# Patient Record
Sex: Female | Born: 1950 | Race: Black or African American | Hispanic: No | Smoking: Current every day smoker
Health system: Southern US, Community
[De-identification: ages and names within clinical notes are randomized; demographics above are authoritative.]

## PROBLEM LIST (undated history)

## (undated) DIAGNOSIS — K219 Gastro-esophageal reflux disease without esophagitis: Secondary | ICD-10-CM

## (undated) DIAGNOSIS — F32A Depression, unspecified: Secondary | ICD-10-CM

## (undated) DIAGNOSIS — T8859XA Other complications of anesthesia, initial encounter: Secondary | ICD-10-CM

## (undated) DIAGNOSIS — E119 Type 2 diabetes mellitus without complications: Secondary | ICD-10-CM

## (undated) DIAGNOSIS — L409 Psoriasis, unspecified: Secondary | ICD-10-CM

## (undated) DIAGNOSIS — G709 Myoneural disorder, unspecified: Secondary | ICD-10-CM

## (undated) DIAGNOSIS — F329 Major depressive disorder, single episode, unspecified: Secondary | ICD-10-CM

## (undated) DIAGNOSIS — F5104 Psychophysiologic insomnia: Secondary | ICD-10-CM

## (undated) DIAGNOSIS — C189 Malignant neoplasm of colon, unspecified: Secondary | ICD-10-CM

## (undated) DIAGNOSIS — T7840XA Allergy, unspecified, initial encounter: Secondary | ICD-10-CM

## (undated) DIAGNOSIS — M1990 Unspecified osteoarthritis, unspecified site: Secondary | ICD-10-CM

## (undated) DIAGNOSIS — I10 Essential (primary) hypertension: Secondary | ICD-10-CM

## (undated) DIAGNOSIS — Z860100 Personal history of colon polyps, unspecified: Secondary | ICD-10-CM

## (undated) DIAGNOSIS — E785 Hyperlipidemia, unspecified: Secondary | ICD-10-CM

## (undated) HISTORY — DX: Essential (primary) hypertension: I10

## (undated) HISTORY — PX: ABDOMINAL HYSTERECTOMY: SHX81

## (undated) HISTORY — DX: Type 2 diabetes mellitus without complications: E11.9

## (undated) HISTORY — DX: Depression, unspecified: F32.A

## (undated) HISTORY — DX: Allergy, unspecified, initial encounter: T78.40XA

## (undated) HISTORY — DX: Personal history of colonic polyps: Z86.010

## (undated) HISTORY — DX: Psychophysiologic insomnia: F51.04

## (undated) HISTORY — DX: Hyperlipidemia, unspecified: E78.5

## (undated) HISTORY — PX: CHOLECYSTECTOMY: SHX55

## (undated) HISTORY — DX: Psoriasis, unspecified: L40.9

## (undated) HISTORY — DX: Myoneural disorder, unspecified: G70.9

## (undated) HISTORY — PX: OTHER SURGICAL HISTORY: SHX169

## (undated) HISTORY — DX: Personal history of colon polyps, unspecified: Z86.0100

---
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
## 1998-11-25 HISTORY — PX: POLYPECTOMY: SHX149

## 1999-11-26 HISTORY — PX: HAND SURGERY: SHX662

## 2003-11-26 HISTORY — PX: CERVICAL FUSION: SHX112

## 2005-07-05 ENCOUNTER — Ambulatory Visit: Payer: Self-pay | Admitting: Cardiovascular Disease

## 2005-08-13 ENCOUNTER — Ambulatory Visit: Payer: Self-pay | Admitting: Family Medicine

## 2005-08-22 ENCOUNTER — Ambulatory Visit: Payer: Self-pay | Admitting: Family Medicine

## 2005-09-09 ENCOUNTER — Ambulatory Visit: Payer: Self-pay | Admitting: Pain Medicine

## 2005-09-19 ENCOUNTER — Encounter: Payer: Self-pay | Admitting: Pain Medicine

## 2005-09-24 ENCOUNTER — Ambulatory Visit: Payer: Self-pay | Admitting: Pain Medicine

## 2005-09-25 ENCOUNTER — Encounter: Payer: Self-pay | Admitting: Pain Medicine

## 2005-11-27 ENCOUNTER — Ambulatory Visit: Payer: Self-pay | Admitting: Pain Medicine

## 2005-12-03 ENCOUNTER — Ambulatory Visit: Payer: Self-pay | Admitting: Pain Medicine

## 2005-12-17 ENCOUNTER — Ambulatory Visit: Payer: Self-pay | Admitting: Physician Assistant

## 2006-01-21 ENCOUNTER — Ambulatory Visit: Payer: Self-pay | Admitting: Physician Assistant

## 2006-01-30 ENCOUNTER — Ambulatory Visit: Payer: Self-pay | Admitting: Pain Medicine

## 2006-02-13 ENCOUNTER — Ambulatory Visit: Payer: Self-pay | Admitting: Pain Medicine

## 2006-02-25 ENCOUNTER — Ambulatory Visit: Payer: Self-pay | Admitting: Physician Assistant

## 2009-11-25 HISTORY — PX: CORONARY ANGIOPLASTY: SHX604

## 2010-03-16 DIAGNOSIS — I2510 Atherosclerotic heart disease of native coronary artery without angina pectoris: Secondary | ICD-10-CM | POA: Insufficient documentation

## 2011-02-07 ENCOUNTER — Observation Stay: Payer: Self-pay | Admitting: Internal Medicine

## 2011-02-08 DIAGNOSIS — R079 Chest pain, unspecified: Secondary | ICD-10-CM

## 2011-11-05 DIAGNOSIS — M960 Pseudarthrosis after fusion or arthrodesis: Secondary | ICD-10-CM | POA: Insufficient documentation

## 2011-11-05 DIAGNOSIS — S129XXA Fracture of neck, unspecified, initial encounter: Secondary | ICD-10-CM | POA: Insufficient documentation

## 2012-01-16 DIAGNOSIS — R51 Headache: Secondary | ICD-10-CM

## 2012-01-16 DIAGNOSIS — M47816 Spondylosis without myelopathy or radiculopathy, lumbar region: Secondary | ICD-10-CM | POA: Insufficient documentation

## 2012-01-16 DIAGNOSIS — G950 Syringomyelia and syringobulbia: Secondary | ICD-10-CM | POA: Insufficient documentation

## 2013-04-12 ENCOUNTER — Ambulatory Visit: Payer: Self-pay | Admitting: Orthopedic Surgery

## 2013-04-15 ENCOUNTER — Ambulatory Visit: Payer: Self-pay | Admitting: Gastroenterology

## 2013-05-04 ENCOUNTER — Ambulatory Visit: Payer: Self-pay | Admitting: Orthopedic Surgery

## 2013-07-06 ENCOUNTER — Emergency Department: Payer: Self-pay | Admitting: Emergency Medicine

## 2013-07-06 LAB — CBC
HCT: 42.1 % (ref 35.0–47.0)
MCHC: 34.9 g/dL (ref 32.0–36.0)
Platelet: 208 10*3/uL (ref 150–440)

## 2013-07-06 LAB — BASIC METABOLIC PANEL
Anion Gap: 6 — ABNORMAL LOW (ref 7–16)
Calcium, Total: 9.1 mg/dL (ref 8.5–10.1)
Chloride: 103 mmol/L (ref 98–107)
Co2: 28 mmol/L (ref 21–32)
EGFR (African American): >60
EGFR (Non-African Amer.): >60
Glucose: 128 mg/dL — ABNORMAL HIGH (ref 65–99)
Potassium: 3.6 mmol/L (ref 3.5–5.1)

## 2013-07-06 LAB — URINALYSIS, COMPLETE
Bacteria: NONE SEEN
Glucose,UR: NEGATIVE mg/dL (ref 0–75)
Ketone: NEGATIVE
Leukocyte Esterase: NEGATIVE
Protein: NEGATIVE
Squamous Epithelial: <1

## 2013-07-06 LAB — LIPASE, BLOOD: Lipase: 72 U/L — ABNORMAL LOW (ref 73–393)

## 2013-07-06 LAB — HEPATIC FUNCTION PANEL A (ARMC): Bilirubin, Direct: <0.1 mg/dL (ref 0.00–0.20)

## 2014-02-23 DIAGNOSIS — K219 Gastro-esophageal reflux disease without esophagitis: Secondary | ICD-10-CM | POA: Insufficient documentation

## 2014-02-23 DIAGNOSIS — F3341 Major depressive disorder, recurrent, in partial remission: Secondary | ICD-10-CM | POA: Insufficient documentation

## 2014-02-23 DIAGNOSIS — E782 Mixed hyperlipidemia: Secondary | ICD-10-CM | POA: Insufficient documentation

## 2014-02-23 DIAGNOSIS — I10 Essential (primary) hypertension: Secondary | ICD-10-CM | POA: Insufficient documentation

## 2014-09-28 ENCOUNTER — Ambulatory Visit: Payer: Self-pay | Admitting: Internal Medicine

## 2014-12-28 DIAGNOSIS — G629 Polyneuropathy, unspecified: Secondary | ICD-10-CM | POA: Diagnosis not present

## 2014-12-28 DIAGNOSIS — M542 Cervicalgia: Secondary | ICD-10-CM | POA: Diagnosis not present

## 2014-12-28 DIAGNOSIS — M545 Low back pain: Secondary | ICD-10-CM | POA: Diagnosis not present

## 2014-12-28 DIAGNOSIS — R270 Ataxia, unspecified: Secondary | ICD-10-CM | POA: Diagnosis not present

## 2015-01-11 ENCOUNTER — Encounter: Payer: Self-pay | Admitting: Rheumatology

## 2015-01-11 DIAGNOSIS — M2560 Stiffness of unspecified joint, not elsewhere classified: Secondary | ICD-10-CM | POA: Diagnosis not present

## 2015-01-11 DIAGNOSIS — M5440 Lumbago with sciatica, unspecified side: Secondary | ICD-10-CM | POA: Diagnosis not present

## 2015-01-11 DIAGNOSIS — M542 Cervicalgia: Secondary | ICD-10-CM | POA: Diagnosis not present

## 2015-01-16 DIAGNOSIS — I2510 Atherosclerotic heart disease of native coronary artery without angina pectoris: Secondary | ICD-10-CM | POA: Diagnosis not present

## 2015-01-16 DIAGNOSIS — K219 Gastro-esophageal reflux disease without esophagitis: Secondary | ICD-10-CM | POA: Diagnosis not present

## 2015-01-16 DIAGNOSIS — R7309 Other abnormal glucose: Secondary | ICD-10-CM | POA: Diagnosis not present

## 2015-01-16 DIAGNOSIS — T162XXA Foreign body in left ear, initial encounter: Secondary | ICD-10-CM | POA: Diagnosis not present

## 2015-01-16 DIAGNOSIS — Z0000 Encounter for general adult medical examination without abnormal findings: Secondary | ICD-10-CM | POA: Diagnosis not present

## 2015-01-16 DIAGNOSIS — R251 Tremor, unspecified: Secondary | ICD-10-CM | POA: Diagnosis not present

## 2015-01-16 DIAGNOSIS — M5440 Lumbago with sciatica, unspecified side: Secondary | ICD-10-CM | POA: Diagnosis not present

## 2015-01-16 DIAGNOSIS — M542 Cervicalgia: Secondary | ICD-10-CM | POA: Diagnosis not present

## 2015-01-16 DIAGNOSIS — M2560 Stiffness of unspecified joint, not elsewhere classified: Secondary | ICD-10-CM | POA: Diagnosis not present

## 2015-01-16 DIAGNOSIS — I10 Essential (primary) hypertension: Secondary | ICD-10-CM | POA: Diagnosis not present

## 2015-01-18 DIAGNOSIS — M5440 Lumbago with sciatica, unspecified side: Secondary | ICD-10-CM | POA: Diagnosis not present

## 2015-01-18 DIAGNOSIS — M542 Cervicalgia: Secondary | ICD-10-CM | POA: Diagnosis not present

## 2015-01-18 DIAGNOSIS — M2560 Stiffness of unspecified joint, not elsewhere classified: Secondary | ICD-10-CM | POA: Diagnosis not present

## 2015-01-23 DIAGNOSIS — M2560 Stiffness of unspecified joint, not elsewhere classified: Secondary | ICD-10-CM | POA: Diagnosis not present

## 2015-01-23 DIAGNOSIS — M5440 Lumbago with sciatica, unspecified side: Secondary | ICD-10-CM | POA: Diagnosis not present

## 2015-01-23 DIAGNOSIS — M542 Cervicalgia: Secondary | ICD-10-CM | POA: Diagnosis not present

## 2015-01-24 ENCOUNTER — Encounter
Admit: 2015-01-24 | Disposition: A | Discharge status: Home / Self Care | Payer: Self-pay | Attending: Rheumatology | Admitting: Rheumatology

## 2015-01-24 DIAGNOSIS — M5030 Other cervical disc degeneration, unspecified cervical region: Secondary | ICD-10-CM | POA: Diagnosis not present

## 2015-01-24 DIAGNOSIS — M542 Cervicalgia: Secondary | ICD-10-CM | POA: Diagnosis not present

## 2015-01-24 DIAGNOSIS — I10 Essential (primary) hypertension: Secondary | ICD-10-CM | POA: Diagnosis not present

## 2015-01-24 DIAGNOSIS — Z6827 Body mass index (BMI) 27.0-27.9, adult: Secondary | ICD-10-CM | POA: Diagnosis not present

## 2015-01-24 DIAGNOSIS — S129XXA Fracture of neck, unspecified, initial encounter: Secondary | ICD-10-CM | POA: Diagnosis not present

## 2015-01-24 DIAGNOSIS — M47812 Spondylosis without myelopathy or radiculopathy, cervical region: Secondary | ICD-10-CM | POA: Diagnosis not present

## 2015-01-24 DIAGNOSIS — M5020 Other cervical disc displacement, unspecified cervical region: Secondary | ICD-10-CM | POA: Diagnosis not present

## 2015-02-08 DIAGNOSIS — G959 Disease of spinal cord, unspecified: Secondary | ICD-10-CM | POA: Diagnosis not present

## 2015-02-08 DIAGNOSIS — M6281 Muscle weakness (generalized): Secondary | ICD-10-CM | POA: Diagnosis not present

## 2015-02-08 DIAGNOSIS — R2689 Other abnormalities of gait and mobility: Secondary | ICD-10-CM | POA: Diagnosis not present

## 2015-02-08 DIAGNOSIS — R200 Anesthesia of skin: Secondary | ICD-10-CM | POA: Diagnosis not present

## 2015-02-25 ENCOUNTER — Ambulatory Visit
Admit: 2015-02-25 | Disposition: A | Discharge status: Home / Self Care | Payer: Self-pay | Attending: Neurology | Admitting: Neurology

## 2015-02-25 DIAGNOSIS — G252 Other specified forms of tremor: Secondary | ICD-10-CM | POA: Diagnosis not present

## 2015-02-25 DIAGNOSIS — Z981 Arthrodesis status: Secondary | ICD-10-CM | POA: Diagnosis not present

## 2015-02-25 DIAGNOSIS — M5020 Other cervical disc displacement, unspecified cervical region: Secondary | ICD-10-CM | POA: Diagnosis not present

## 2015-02-25 DIAGNOSIS — M5021 Other cervical disc displacement,  high cervical region: Secondary | ICD-10-CM | POA: Diagnosis not present

## 2015-02-25 DIAGNOSIS — M5022 Other cervical disc displacement, mid-cervical region: Secondary | ICD-10-CM | POA: Diagnosis not present

## 2015-03-20 DIAGNOSIS — G5601 Carpal tunnel syndrome, right upper limb: Secondary | ICD-10-CM | POA: Diagnosis not present

## 2015-03-20 DIAGNOSIS — G5602 Carpal tunnel syndrome, left upper limb: Secondary | ICD-10-CM | POA: Diagnosis not present

## 2015-04-12 DIAGNOSIS — R2689 Other abnormalities of gait and mobility: Secondary | ICD-10-CM | POA: Diagnosis not present

## 2015-04-12 DIAGNOSIS — R251 Tremor, unspecified: Secondary | ICD-10-CM | POA: Diagnosis not present

## 2015-04-28 ENCOUNTER — Other Ambulatory Visit: Payer: Self-pay | Admitting: Internal Medicine

## 2015-04-28 DIAGNOSIS — Z1231 Encounter for screening mammogram for malignant neoplasm of breast: Secondary | ICD-10-CM

## 2015-05-10 ENCOUNTER — Ambulatory Visit
Admission: RE | Admit: 2015-05-10 | Discharge: 2015-05-10 | Disposition: A | Discharge status: Home / Self Care | Payer: Commercial Managed Care - HMO | Source: Ambulatory Visit | Attending: Internal Medicine | Admitting: Internal Medicine

## 2015-05-10 DIAGNOSIS — R922 Inconclusive mammogram: Secondary | ICD-10-CM | POA: Diagnosis not present

## 2015-05-10 DIAGNOSIS — Z1231 Encounter for screening mammogram for malignant neoplasm of breast: Secondary | ICD-10-CM | POA: Diagnosis not present

## 2015-05-10 HISTORY — DX: Malignant neoplasm of colon, unspecified: C18.9

## 2015-05-11 DIAGNOSIS — E780 Pure hypercholesterolemia: Secondary | ICD-10-CM | POA: Diagnosis not present

## 2015-05-11 DIAGNOSIS — R0609 Other forms of dyspnea: Secondary | ICD-10-CM | POA: Diagnosis not present

## 2015-05-11 DIAGNOSIS — I2510 Atherosclerotic heart disease of native coronary artery without angina pectoris: Secondary | ICD-10-CM | POA: Diagnosis not present

## 2015-05-11 DIAGNOSIS — I10 Essential (primary) hypertension: Secondary | ICD-10-CM | POA: Diagnosis not present

## 2015-05-13 ENCOUNTER — Other Ambulatory Visit: Payer: Self-pay | Admitting: Internal Medicine

## 2015-05-13 ENCOUNTER — Encounter: Payer: Self-pay | Admitting: Internal Medicine

## 2015-05-13 DIAGNOSIS — F5104 Psychophysiologic insomnia: Secondary | ICD-10-CM

## 2015-05-13 DIAGNOSIS — I2510 Atherosclerotic heart disease of native coronary artery without angina pectoris: Secondary | ICD-10-CM

## 2015-05-13 DIAGNOSIS — R7303 Prediabetes: Secondary | ICD-10-CM | POA: Insufficient documentation

## 2015-05-13 DIAGNOSIS — L409 Psoriasis, unspecified: Secondary | ICD-10-CM | POA: Insufficient documentation

## 2015-05-13 DIAGNOSIS — Z9861 Coronary angioplasty status: Secondary | ICD-10-CM

## 2015-05-13 DIAGNOSIS — R7302 Impaired glucose tolerance (oral): Secondary | ICD-10-CM

## 2015-05-13 DIAGNOSIS — M259 Joint disorder, unspecified: Secondary | ICD-10-CM | POA: Insufficient documentation

## 2015-05-13 DIAGNOSIS — G250 Essential tremor: Secondary | ICD-10-CM | POA: Insufficient documentation

## 2015-06-19 ENCOUNTER — Other Ambulatory Visit: Payer: Self-pay

## 2015-06-19 ENCOUNTER — Encounter: Payer: Self-pay | Admitting: Internal Medicine

## 2015-06-19 ENCOUNTER — Other Ambulatory Visit: Payer: Self-pay | Admitting: Internal Medicine

## 2015-06-19 ENCOUNTER — Ambulatory Visit (INDEPENDENT_AMBULATORY_CARE_PROVIDER_SITE_OTHER): Payer: Commercial Managed Care - HMO | Admitting: Internal Medicine

## 2015-06-19 VITALS — BP 110/64 | HR 72 | Ht 60.0 in | Wt 140.8 lb

## 2015-06-19 DIAGNOSIS — F329 Major depressive disorder, single episode, unspecified: Secondary | ICD-10-CM | POA: Diagnosis not present

## 2015-06-19 DIAGNOSIS — I10 Essential (primary) hypertension: Secondary | ICD-10-CM

## 2015-06-19 DIAGNOSIS — Z111 Encounter for screening for respiratory tuberculosis: Secondary | ICD-10-CM

## 2015-06-19 DIAGNOSIS — G250 Essential tremor: Secondary | ICD-10-CM

## 2015-06-19 DIAGNOSIS — M259 Joint disorder, unspecified: Secondary | ICD-10-CM

## 2015-06-19 DIAGNOSIS — Z1239 Encounter for other screening for malignant neoplasm of breast: Secondary | ICD-10-CM

## 2015-06-19 DIAGNOSIS — F32A Depression, unspecified: Secondary | ICD-10-CM

## 2015-06-19 NOTE — Progress Notes (Signed)
Date:  06/19/2015   Name:  Betty Vaughn   DOB:  1951/01/23   MRN:  [id number]   Chief Complaint: No chief complaint on file. 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  She is on disability from her neck surgery.  The agency is [employer].  They need a note from me stating that she can do that kind of work. Breast cancer screening - Patient had a mammogram in June but has not received a letter or a call.  She denies breast problems.  On chart review the order is present but no results are available.  Review of Systems:  Review of Systems  Constitutional: Negative for unexpected weight change.  HENT: Negative for hearing loss and trouble swallowing.   Eyes: Negative for visual disturbance.  Respiratory: Negative for shortness of breath and wheezing.   Cardiovascular: Negative for chest pain, palpitations and leg swelling.  Musculoskeletal: Positive for neck pain and neck stiffness.  Neurological: Positive for tremors (improved - without medication). Negative for dizziness, seizures, weakness, numbness and headaches.  Psychiatric/Behavioral: Positive for dysphoric mood (mild symptoms due to being at home and bored with nothing to occupy her.). Negative for confusion.    Patient Active Problem List   Diagnosis Date Noted  . Glucose intolerance (impaired glucose tolerance) 05/13/2015  . Benign essential tremor 05/13/2015  . Chronic insomnia 05/13/2015  . 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 Multiple joint complaints 05/13/2015  . Clinical depression 02/23/2014  . Acid reflux 02/23/2014  . BP (high blood pressure) 02/23/2014  . HLD (hyperlipidemia) 02/23/2014  . CAD S/P percutaneous coronary angioplasty 03/16/2010    Prior to Admission medications   Medication Sig Start Date End Date Taking? Authorizing Provider  amLODipine (NORVASC) 5 MG tablet take 1 tablet by mouth once daily 05/13/15  Yes Glean Hess, MD  atorvastatin (LIPITOR) 40 MG tablet  Take 1 tablet by mouth daily.   Yes Historical Provider, MD  chlorthalidone (HYGROTON) 25 MG tablet Take 1 tablet by mouth daily.   Yes Historical Provider, MD  DULoxetine (CYMBALTA) 60 MG capsule Take 1 capsule by mouth daily.   Yes Historical Provider, MD  gabapentin (NEURONTIN) 300 MG capsule Take 1 capsule by mouth at bedtime. 09/28/14  Yes Historical Provider, MD  glucose blood test strip  11/09/14  Yes Historical Provider, MD  Lancets (ACCU-CHEK MULTICLIX) lancets  11/09/14  Yes Historical Provider, MD  meloxicam (MOBIC) 15 MG tablet Take 1 tablet by mouth daily as needed. 04/05/14  Yes Historical Provider, MD  methocarbamol (ROBAXIN) 750 MG tablet Take 1 tablet by mouth 2 (two) times daily. 04/12/15  Yes Historical Provider, MD  metoprolol (LOPRESSOR) 50 MG tablet Take 1 tablet by mouth 2 (two) times daily.   Yes Historical Provider, MD  nitroGLYCERIN (NITROSTAT) 0.4 MG SL tablet Place under the tongue.   Yes Historical Provider, MD  omeprazole (PRILOSEC) 40 MG capsule Take 1 capsule by mouth daily.   Yes Historical Provider, MD  potassium chloride SA (K-DUR,KLOR-CON) 20 MEQ tablet Take 1 tablet by mouth daily.   Yes Historical Provider, MD  traZODone (DESYREL) 50 MG tablet Take 1 tablet by mouth at bedtime.   Yes Historical Provider, MD    Allergies  Allergen Reactions  . Propoxyphene Anaphylaxis  . Tramadol Anaphylaxis  . Penicillins Swelling  . Piroxicam Rash  . Sertraline Hcl Rash    Past Surgical History  Procedure Laterality Date  . Cervical fusion  2005  . Cholecystectomy    .  Abdominal hysterectomy      total  . Rigger finger    . Coronary angioplasty with stent placement      History  Substance Use Topics  . Smoking status: Current Every Day Smoker    Types: E-cigarettes  . Smokeless tobacco: Not on file  . Alcohol Use: 2.4 oz/week    4 Standard drinks or equivalent per week     Medication list has been reviewed and updated.  Physical  Examination:  Physical Exam  Constitutional: She appears well-developed and well-nourished.  HENT:  Head: Normocephalic.  Cardiovascular: Normal rate, regular rhythm and normal heart sounds.   Pulmonary/Chest: Effort normal and breath sounds normal. She has no wheezes.  Musculoskeletal: She exhibits no edema or tenderness.  Psychiatric: She has a normal mood and affect. Her behavior is normal. Thought content normal.  Nursing note and vitals reviewed.   BP 110/64 mmHg  Pulse 72  Ht 5' (1.524 m)  Wt 140 lb 12.8 oz (63.866 kg)  BMI 27.50 kg/m2  Assessment and Plan: 1. Essential hypertension Controlled on medication  2. Clinical depression Stable on medication  3. Screening-pulmonary TB - PPD placed on right forearm  4. Breast cancer screening Will obtain mammogram report and call her with the results as soon as possible.   Halina Maidens, MD [facility]  06/19/2015

## 2015-06-21 ENCOUNTER — Other Ambulatory Visit: Payer: Self-pay | Admitting: Internal Medicine

## 2015-06-21 DIAGNOSIS — N6489 Other specified disorders of breast: Secondary | ICD-10-CM

## 2015-06-21 DIAGNOSIS — R928 Other abnormal and inconclusive findings on diagnostic imaging of breast: Secondary | ICD-10-CM

## 2015-06-21 LAB — TB SKIN TEST
INDURATION: 0 mm
TB SKIN TEST: NEGATIVE

## 2015-06-22 ENCOUNTER — Ambulatory Visit
Admission: RE | Admit: 2015-06-22 | Discharge: 2015-06-22 | Disposition: A | Discharge status: Home / Self Care | Payer: Commercial Managed Care - HMO | Source: Ambulatory Visit | Attending: Internal Medicine | Admitting: Internal Medicine

## 2015-06-22 DIAGNOSIS — R928 Other abnormal and inconclusive findings on diagnostic imaging of breast: Secondary | ICD-10-CM

## 2015-06-22 DIAGNOSIS — N6489 Other specified disorders of breast: Secondary | ICD-10-CM

## 2015-06-22 DIAGNOSIS — N649 Disorder of breast, unspecified: Secondary | ICD-10-CM | POA: Diagnosis not present

## 2015-07-09 ENCOUNTER — Other Ambulatory Visit: Payer: Self-pay | Admitting: Internal Medicine

## 2015-09-07 ENCOUNTER — Other Ambulatory Visit: Payer: Self-pay | Admitting: Internal Medicine

## 2015-10-07 ENCOUNTER — Other Ambulatory Visit: Payer: Self-pay | Admitting: Internal Medicine

## 2016-01-05 ENCOUNTER — Other Ambulatory Visit: Payer: Self-pay | Admitting: Internal Medicine

## 2016-01-11 NOTE — Telephone Encounter (Signed)
pts coming in on 04/10/16 for cpe

## 2016-01-19 ENCOUNTER — Ambulatory Visit: Payer: Commercial Managed Care - HMO

## 2016-01-19 ENCOUNTER — Other Ambulatory Visit: Payer: Self-pay | Admitting: Internal Medicine

## 2016-01-19 MED ORDER — GABAPENTIN 300 MG PO CAPS: 300.0000 mg | ORAL_CAPSULE | Freq: Every day | ORAL | Status: DC

## 2016-01-19 MED ORDER — MELOXICAM 15 MG PO TABS: 15.0000 mg | ORAL_TABLET | Freq: Every day | ORAL | Status: DC

## 2016-02-05 ENCOUNTER — Other Ambulatory Visit: Payer: Self-pay | Admitting: Internal Medicine

## 2016-02-20 ENCOUNTER — Other Ambulatory Visit: Payer: Self-pay | Admitting: Internal Medicine

## 2016-03-19 ENCOUNTER — Other Ambulatory Visit: Payer: Self-pay | Admitting: Internal Medicine

## 2016-04-10 ENCOUNTER — Encounter: Payer: Self-pay | Admitting: Internal Medicine

## 2016-04-10 ENCOUNTER — Ambulatory Visit (INDEPENDENT_AMBULATORY_CARE_PROVIDER_SITE_OTHER): Payer: Commercial Managed Care - HMO | Admitting: Internal Medicine

## 2016-04-10 VITALS — BP 110/80 | HR 60 | Ht 60.0 in | Wt 136.0 lb

## 2016-04-10 DIAGNOSIS — Z1239 Encounter for other screening for malignant neoplasm of breast: Secondary | ICD-10-CM | POA: Diagnosis not present

## 2016-04-10 DIAGNOSIS — Z1159 Encounter for screening for other viral diseases: Secondary | ICD-10-CM

## 2016-04-10 DIAGNOSIS — Z9861 Coronary angioplasty status: Secondary | ICD-10-CM

## 2016-04-10 DIAGNOSIS — F32A Depression, unspecified: Secondary | ICD-10-CM

## 2016-04-10 DIAGNOSIS — G250 Essential tremor: Secondary | ICD-10-CM

## 2016-04-10 DIAGNOSIS — G629 Polyneuropathy, unspecified: Secondary | ICD-10-CM | POA: Diagnosis not present

## 2016-04-10 DIAGNOSIS — F329 Major depressive disorder, single episode, unspecified: Secondary | ICD-10-CM

## 2016-04-10 DIAGNOSIS — Z0000 Encounter for general adult medical examination without abnormal findings: Secondary | ICD-10-CM | POA: Diagnosis not present

## 2016-04-10 DIAGNOSIS — I2510 Atherosclerotic heart disease of native coronary artery without angina pectoris: Secondary | ICD-10-CM

## 2016-04-10 DIAGNOSIS — E785 Hyperlipidemia, unspecified: Secondary | ICD-10-CM

## 2016-04-10 DIAGNOSIS — K219 Gastro-esophageal reflux disease without esophagitis: Secondary | ICD-10-CM | POA: Diagnosis not present

## 2016-04-10 DIAGNOSIS — Z23 Encounter for immunization: Secondary | ICD-10-CM

## 2016-04-10 DIAGNOSIS — R7302 Impaired glucose tolerance (oral): Secondary | ICD-10-CM | POA: Diagnosis not present

## 2016-04-10 DIAGNOSIS — I10 Essential (primary) hypertension: Secondary | ICD-10-CM | POA: Diagnosis not present

## 2016-04-10 LAB — POCT URINALYSIS DIPSTICK
BILIRUBIN UA: NEGATIVE
Glucose, UA: NEGATIVE
Ketones, UA: NEGATIVE
Leukocytes, UA: NEGATIVE
Nitrite, UA: NEGATIVE
PH UA: 6.5
Protein, UA: NEGATIVE
RBC UA: NEGATIVE
Spec Grav, UA: 1.01
Urobilinogen, UA: 0.2

## 2016-04-10 MED ORDER — FLUTICASONE PROPIONATE 50 MCG/ACT NA SUSP: 2.0000 | Freq: Every day | NASAL | Status: DC

## 2016-04-10 NOTE — Patient Instructions (Addendum)
Health Maintenance  Topic Date Due  . HIV Screening  09/19/1966  . TETANUS/TDAP  09/19/1970  . ZOSTAVAX  09/20/2011  . INFLUENZA VACCINE  06/25/2016  . MAMMOGRAM  05/09/2017  . COLONOSCOPY  05/09/2017  . Hepatitis C Screening  Addressed   Tdap Vaccine (Tetanus, Diphtheria and Pertussis): What You Need to Know 1. Why get vaccinated? Tetanus, diphtheria and pertussis are very serious diseases. Tdap vaccine can protect [place] from these diseases. And, Tdap vaccine given to pregnant women can protect newborn babies against pertussis. TETANUS (Lockjaw) is rare in the [place] today. It causes painful muscle tightening and stiffness, usually all over the body.  It can lead to tightening of muscles in the head and neck so you can't open your mouth, swallow, or sometimes even breathe. Tetanus kills about 1 out of 10 people who are infected even after receiving the best medical care. DIPHTHERIA is also rare in the [place] today. It can cause a thick coating to form in the back of the throat.  It can lead to breathing problems, heart failure, paralysis, and death. PERTUSSIS (Whooping Cough) causes severe coughing spells, which can cause difficulty breathing, vomiting and disturbed sleep.  It can also lead to weight loss, incontinence, and rib fractures. Up to 2 in 100 adolescents and 5 in 100 adults with pertussis are hospitalized or have complications, which could include pneumonia or death. These diseases are caused by bacteria. Diphtheria and pertussis are spread from person to person through secretions from coughing or sneezing. Tetanus enters the body through cuts, scratches, or wounds. Before vaccines, as many as 200,000 cases of diphtheria, 200,000 cases of pertussis, and hundreds of cases of tetanus, were reported in the [place] each year. Since vaccination began, reports of cases for tetanus and diphtheria have dropped by about 99% and for pertussis by about 80%. 2. Tdap  vaccine Tdap vaccine can protect adolescents and adults from tetanus, diphtheria, and pertussis. One dose of Tdap is routinely given at age 98 or 41. People who did not get Tdap at that age should get it as soon as possible. Tdap is especially important for healthcare professionals and anyone having close contact with a baby younger than 12 months. Pregnant women should get a dose of Tdap during every pregnancy, to protect the newborn from pertussis. Infants are most at risk for severe, life-threatening complications from pertussis. Another vaccine, called Td, protects against tetanus and diphtheria, but not pertussis. A Td booster should be given every 10 years. Tdap may be given as one of these boosters if you have never gotten Tdap before. Tdap may also be given after a severe cut or burn to prevent tetanus infection. Your doctor or the person giving you the vaccine can give you more information. Tdap may safely be given at the same time as other vaccines. 3. Some people should not get this vaccine  A person who has ever had a life-threatening allergic reaction after a previous dose of any diphtheria, tetanus or pertussis containing vaccine, OR has a severe allergy to any part of this vaccine, should not get Tdap vaccine. Tell the person giving the vaccine about any severe allergies.  Anyone who had coma or long repeated seizures within 7 days after a childhood dose of DTP or DTaP, or a previous dose of Tdap, should not get Tdap, unless a cause other than the vaccine was found. They can still get Td.  Talk to your doctor if you:  have seizures or  another nervous system problem,  had severe pain or swelling after any vaccine containing diphtheria, tetanus or pertussis,  ever had a condition called Guillain-Barr Syndrome (GBS),  aren't feeling well on the day the shot is scheduled. 4. Risks With any medicine, including vaccines, there is a chance of side effects. These are usually mild and  go away on their own. Serious reactions are also possible but are rare. Most people who get Tdap vaccine do not have any problems with it. Mild problems following Tdap (Did not interfere with activities)  Pain where the shot was given (about 3 in 4 adolescents or 2 in 3 adults)  Redness or swelling where the shot was given (about 1 person in 5)  Mild fever of at least 100.20F (up to about 1 in 25 adolescents or 1 in 100 adults)  Headache (about 3 or 4 people in 10)  Tiredness (about 1 person in 3 or 4)  Nausea, vomiting, diarrhea, stomach ache (up to 1 in 4 adolescents or 1 in 10 adults)  Chills, sore joints (about 1 person in 10)  Body aches (about 1 person in 3 or 4)  Rash, swollen glands (uncommon) Moderate problems following Tdap (Interfered with activities, but did not require medical attention)  Pain where the shot was given (up to 1 in 5 or 6)  Redness or swelling where the shot was given (up to about 1 in 16 adolescents or 1 in 12 adults)  Fever over 102F (about 1 in 100 adolescents or 1 in 250 adults)  Headache (about 1 in 7 adolescents or 1 in 10 adults)  Nausea, vomiting, diarrhea, stomach ache (up to 1 or 3 people in 100)  Swelling of the entire arm where the shot was given (up to about 1 in 500). Severe problems following Tdap (Unable to perform usual activities; required medical attention)  Swelling, severe pain, bleeding and redness in the arm where the shot was given (rare). Problems that could happen after any vaccine:  People sometimes faint after a medical procedure, including vaccination. Sitting or lying down for about 15 minutes can help prevent fainting, and injuries caused by a fall. Tell your doctor if you feel dizzy, or have vision changes or ringing in the ears.  Some people get severe pain in the shoulder and have difficulty moving the arm where a shot was given. This happens very rarely.  Any medication can cause a severe allergic reaction.  Such reactions from a vaccine are very rare, estimated at fewer than 1 in a million doses, and would happen within a few minutes to a few hours after the vaccination. As with any medicine, there is a very remote chance of a vaccine causing a serious injury or death. The safety of vaccines is always being monitored. For more information, visit: http://www.aguilar.org/ 5. What if there is a serious problem? What should I look for?  Look for anything that concerns you, such as signs of a severe allergic reaction, very high fever, or unusual behavior.  Signs of a severe allergic reaction can include hives, swelling of the face and throat, difficulty breathing, a fast heartbeat, dizziness, and weakness. These would usually start a few minutes to a few hours after the vaccination. What should I do?  If you think it is a severe allergic reaction or other emergency that can't wait, call 9-1-1 or get the person to the nearest hospital. Otherwise, call your doctor.  Afterward, the reaction should be reported to the Vaccine Adverse  Event Reporting System (VAERS). Your doctor might file this report, or you can do it yourself through the VAERS web site at www.vaers.SamedayNews.es, or by calling 253-076-2951. VAERS does not give medical advice.  6. The National Vaccine Injury Compensation Program The [employer] Vaccine Injury Compensation Program (VICP) is a federal program that was created to compensate people who may have been injured by certain vaccines. Persons who believe they may have been injured by a vaccine can learn about the program and about filing a claim by calling 832 879 9966 or visiting the [place] website at GoldCloset.com.ee. There is a time limit to file a claim for compensation. 7. How can I learn more?  Ask your doctor. He or she can give you the vaccine package insert or suggest other sources of information.  Call your local or state health department.  Contact the Centers  for Disease Control and Prevention (CDC):  Call 262-185-6589 (1-800-CDC-INFO) or  Visit CDC's website at http://hunter.com/ CDC Tdap Vaccine VIS (01/18/14)   This information is not intended to replace advice given to you by your health care provider. Make sure you discuss any questions you have with your health care provider.   Document Released: 05/12/2012 Document Revised: 12/02/2014 Document Reviewed: 02/23/2014 Elsevier Interactive Patient Education [employer].

## 2016-04-10 NOTE — Progress Notes (Signed)
Patient: Betty Vaughn, Female    DOB: Aug 02, 1951, 65 y.o.   MRN: [id number] Visit Date: 04/10/2016  Today's Provider: Halina Maidens, MD   Chief Complaint  Patient presents with  . Medicare Wellness  . Hypertension  . Hyperlipidemia  . Allergic Rhinitis     eyes are irritated   Subjective:    Annual wellness visit Betty Vaughn is a 65 y.o. female who presents today for her Subsequent Annual Wellness Visit. She feels fairly well. She reports exercising none due to balance and neuropathy. She reports she is sleeping fairly well with Trazodone.  She has not had a mammogram this year. ----------------------------------------------------------- Hypertension This is a chronic problem. The current episode started more than 1 year ago. The problem is unchanged. The problem is controlled. Pertinent negatives include no chest pain, headaches, palpitations or shortness of breath. Past treatments include diuretics, calcium channel blockers and beta blockers. The current treatment provides significant improvement. Hypertensive end-organ damage includes CAD/MI.  Hyperlipidemia This is a chronic problem. The current episode started more than 1 year ago. The problem is controlled. Recent lipid tests were reviewed and are normal. Pertinent negatives include no chest pain, focal weakness, leg pain, myalgias or shortness of breath. Current antihyperlipidemic treatment includes statins.  Gastroesophageal Reflux She complains of heartburn. She reports no abdominal pain, no chest pain, no coughing or no wheezing. This is a chronic problem. The current episode started more than 1 year ago. The problem occurs rarely. The problem has been resolved. Pertinent negatives include no fatigue. She has tried a PPI for the symptoms. The treatment provided significant relief.  Glucose intolerance - A1C last year 6.2.  She follows a fair diet.  She does not check blood sugar regularly.  CAD - followed by Dr.  Josefa Half.  S/p PTCA in 2011 with good BP control and no recurrence of symptoms.  She used NTG about 5 months ago when she was stressed about her daughters health.  Her last visit was supposed to be in January but she cancelled it.  Neuropathy - in feet and toes.  She has numbness and pain if she walks or pushes the gas petal of her car. She has intermittent pain at rest.She has had an EMG/NCS through a neurologist in [place].  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  She has been seen by Dr. Manuella Ghazi.  She takes gabapentin daily. She reports that the tremor is worse with exertion and better at rest.  It interfers with her ADLS minimally.  Review of Systems  Constitutional: Negative for fever, chills and fatigue.  HENT: Positive for postnasal drip and sinus pressure. Negative for congestion, hearing loss, tinnitus, trouble swallowing and voice change.   Eyes: Negative for visual disturbance.  Respiratory: Negative for cough, chest tightness, shortness of breath and wheezing.   Cardiovascular: Negative for chest pain, palpitations and leg swelling.  Gastrointestinal: Positive for heartburn. Negative for vomiting, abdominal pain, diarrhea and constipation.  Endocrine: Negative for polydipsia and polyuria.  Genitourinary: Negative for dysuria, frequency, vaginal bleeding, vaginal discharge and genital sores.  Musculoskeletal: Negative for myalgias, joint swelling, arthralgias and gait problem.  Skin: Negative for color change and rash.  Allergic/Immunologic: Positive for environmental allergies.  Neurological: Positive for tremors (intermttent) and numbness. Negative for dizziness, focal weakness, light-headedness and headaches.  Hematological: Negative for adenopathy. Does not bruise/bleed easily.  Psychiatric/Behavioral: Negative for sleep disturbance and dysphoric mood.  The patient is not nervous/anxious.     Social History     Social History  . Marital Status: Married    Spouse Name: N/A  . Number of Children: N/A  . Years of Education: N/A   Occupational History  . Not on file.   Social History Main Topics  . Smoking status: Current Every Day Smoker    Types: E-cigarettes  . Smokeless tobacco: Not on file  . Alcohol Use: 2.4 oz/week    4 Standard drinks or equivalent per week  . Drug Use: Not on file  . Sexual Activity: Not on file   Other Topics Concern  . Not on file   Social History Narrative    Patient Active Problem List   Diagnosis Date Noted  . Glucose intolerance (impaired glucose tolerance) 05/13/2015  . Benign essential tremor 05/13/2015  . Chronic insomnia 05/13/2015  . 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 Multiple joint complaints 05/13/2015  . Clinical depression 02/23/2014  . Acid reflux 02/23/2014  . Essential hypertension 02/23/2014  . HLD (hyperlipidemia) 02/23/2014  . CAD S/P percutaneous coronary angioplasty 03/16/2010    Past Surgical History  Procedure Laterality Date  . Cervical fusion  2005  . Cholecystectomy    . Abdominal hysterectomy      total  . Rigger finger    . Coronary angioplasty with stent placement      Her family history includes Breast cancer (age of onset: 60) in her mother; CAD in her father; Diabetes in her daughter and mother.    Previous Medications   AMLODIPINE (NORVASC) 5 MG TABLET    TAKE ONE TABLET BY MOUTH EVERY DAY   ATORVASTATIN (LIPITOR) 40 MG TABLET    TAKE ONE TABLET BY MOUTH EVERY DAY   BLOOD GLUCOSE MONITORING SUPPL (ACCU-CHEK AVIVA PLUS) W/DEVICE KIT    use to TEST BLOOD SUGAR   CHLORTHALIDONE (HYGROTON) 25 MG TABLET    TAKE ONE TABLET BY MOUTH EVERY DAY   DULOXETINE (CYMBALTA) 60 MG CAPSULE    Take 1 capsule by mouth daily.   GABAPENTIN (NEURONTIN) 300 MG CAPSULE    Take 1 capsule (300 mg total) by mouth at bedtime.   GLUCOSE BLOOD TEST STRIP       LANCETS (ACCU-CHEK MULTICLIX) LANCETS    TEST BLOOD SUGAR daily or as directed by  prescriber   MELOXICAM (MOBIC) 15 MG TABLET    Take 1 tablet (15 mg total) by mouth daily.   METHOCARBAMOL (ROBAXIN) 750 MG TABLET    Take 1 tablet by mouth 2 (two) times daily.   METOPROLOL (LOPRESSOR) 50 MG TABLET    Take 1 tablet by mouth 2 (two) times daily.   NITROGLYCERIN (NITROSTAT) 0.4 MG SL TABLET    Place under the tongue.   OMEPRAZOLE (PRILOSEC) 40 MG CAPSULE    take 1 capsule by mouth once daily   POTASSIUM CHLORIDE SA (K-DUR,KLOR-CON) 20 MEQ TABLET    TAKE ONE TABLET BY MOUTH EVERY DAY   TRAZODONE (DESYREL) 50 MG TABLET    TAKE ONE TABLET BY MOUTH AT BEDTIME    Patient Care Team: Glean Hess, MD as PCP - General (Family Medicine) Isaias Cowman, MD as Consulting Physician (Cardiology) Julieanne Manson Leeanne Mannan., MD (Rheumatology) Vladimir Crofts, MD (Neurology)     Objective:   Vitals: BP 110/80 mmHg  Pulse 60  Ht 5' (1.524 m)  Wt 136 lb (61.689 kg)  BMI 26.56 kg/m2  Physical Exam  Constitutional: She is oriented to person,  place, and time. She appears well-developed and well-nourished. No distress.  HENT:  Head: Normocephalic and atraumatic.  Right Ear: Tympanic membrane and ear canal normal.  Left Ear: Tympanic membrane and ear canal normal.  Nose: Right sinus exhibits maxillary sinus tenderness. Left sinus exhibits maxillary sinus tenderness.  Mouth/Throat: Uvula is midline and oropharynx is clear and moist. No posterior oropharyngeal edema or posterior oropharyngeal erythema.  Eyes: EOM are normal. Pupils are equal, round, and reactive to light. Right eye exhibits no discharge. Left eye exhibits no discharge. Right conjunctiva has a hemorrhage (superior portion). No scleral icterus.  Neck: Normal range of motion. Carotid bruit is not present. No erythema present. No thyromegaly present.  Cardiovascular: Normal rate, regular rhythm, normal heart sounds and normal pulses.   Pulmonary/Chest: Effort normal. No respiratory distress. She has no wheezes. Right breast  exhibits no mass, no nipple discharge, no skin change and no tenderness. Left breast exhibits no mass, no nipple discharge, no skin change and no tenderness.  Abdominal: Soft. Bowel sounds are normal. There is no hepatosplenomegaly. There is no tenderness. There is no CVA tenderness.  Musculoskeletal:       Cervical back: She exhibits decreased range of motion and tenderness.  Lymphadenopathy:    She has no cervical adenopathy.    She has no axillary adenopathy.  Neurological: She is alert and oriented to person, place, and time. She has normal strength. She displays tremor (worse with exertion; R>L). A sensory deficit (in toes of both feet to light touch) is present. No cranial nerve deficit.  Reflex Scores:      Bicep reflexes are 1+ on the right side and 2+ on the left side.      Patellar reflexes are 2+ on the right side and 2+ on the left side. Skin: Skin is warm, dry and intact. No rash noted.  Psychiatric: She has a normal mood and affect. Her speech is normal and behavior is normal. Thought content normal.  Nursing note and vitals reviewed.   Activities of Daily Living In your present state of health, do you have any difficulty performing the following activities: 04/10/2016 06/19/2015  Hearing? N N  Vision? N N  Difficulty concentrating or making decisions? N N  Walking or climbing stairs? N N  Dressing or bathing? N N  Doing errands, shopping? N N    Fall Risk Assessment Fall Risk  04/10/2016  Falls in the past year? No      Depression Screen PHQ 2/9 Scores 04/10/2016  PHQ - 2 Score 0    Cognitive Testing - 6-CIT   Correct? Score   What year is it? yes 0 Yes = 0    No = 4  What month is it? yes 0 Yes = 0    No = 3  Remember:     [address]ia [address], [place], [place]     What time is it? yes 0 Yes = 0    No = 3  Count backwards from 20 to 1 yes 0 Correct = 0    1 error = 2   More than 1 error = 4  Say the months of the year in reverse. yes 0 Correct = 0    1 error = 2    More than 1 error = 4  What address did I ask you to remember? yes 0 Correct = 0  1 error = 2    2 error = 4    3 error =  6    4 error = 8    All wrong = 10       TOTAL SCORE  0/28   Interpretation:  Normal  Normal (0-7) Abnormal (8-28)        Medicare Annual Wellness Visit Summary:  Reviewed patient's Family Medical History Reviewed and updated list of patient's medical providers Assessment of cognitive impairment was done Assessed patient's functional ability Established a written schedule for health screening [place] Completed and Reviewed  Exercise Activities and Dietary recommendations Goals    None      Immunization History  Administered Date(s) Administered  . PPD Test 06/19/2015    Health Maintenance  Topic Date Due  . Hepatitis C Screening  [birth year]  . HIV Screening  09/19/1966  . TETANUS/TDAP  09/19/1970  . PAP SMEAR  09/19/1972  . COLONOSCOPY  09/19/2001  . ZOSTAVAX  09/20/2011  . INFLUENZA VACCINE  06/25/2016  . MAMMOGRAM  05/09/2017     Discussed health benefits of physical activity, and encouraged her to engage in regular exercise appropriate for her age and condition.    ------------------------------------------------------------------------------------------------------------   Assessment & Plan:  1. Medicare annual wellness visit, subsequent Measures satisfied  2. CAD S/P percutaneous coronary angioplasty Continue metoprolol and statin Recommend regular follow up with Cardiology  3. Glucose intolerance (impaired glucose tolerance) Check labs - Comprehensive metabolic panel - Hemoglobin A1c - TSH  4. Benign essential tremor Stable - recommend follow up with Neurology - POCT urinalysis dipstick  5. Clinical depression Controlled with cymbalta and trazodone  6. HLD (hyperlipidemia) On atorvastatin - Lipid panel  7. Gastroesophageal reflux disease, esophagitis presence not specified Controlled with  PPI - CBC with Differential/Platelet  8. Essential hypertension controlled  9. Breast cancer screening - MM DIGITAL SCREENING BILATERAL; Future  10. Need for hepatitis C screening test - Hepatitis C antibody  11. Neuropathy ([place]) Stable; follow up with Neurology Handicapped parking placard application given  12. Need for diphtheria-tetanus-pertussis (Tdap) vaccine - Tdap vaccine greater than or equal to 7yo IM  *Pt given Rx to get Zostavax after 05/22/16  Halina Maidens, MD [facility]  04/10/2016

## 2016-04-11 ENCOUNTER — Other Ambulatory Visit: Payer: Self-pay | Admitting: Internal Medicine

## 2016-04-11 DIAGNOSIS — Z23 Encounter for immunization: Secondary | ICD-10-CM | POA: Diagnosis not present

## 2016-04-11 DIAGNOSIS — G250 Essential tremor: Secondary | ICD-10-CM | POA: Diagnosis not present

## 2016-04-11 DIAGNOSIS — Z0000 Encounter for general adult medical examination without abnormal findings: Secondary | ICD-10-CM | POA: Diagnosis not present

## 2016-04-11 DIAGNOSIS — K219 Gastro-esophageal reflux disease without esophagitis: Secondary | ICD-10-CM | POA: Diagnosis not present

## 2016-04-11 DIAGNOSIS — F329 Major depressive disorder, single episode, unspecified: Secondary | ICD-10-CM | POA: Diagnosis not present

## 2016-04-11 DIAGNOSIS — R7302 Impaired glucose tolerance (oral): Secondary | ICD-10-CM | POA: Diagnosis not present

## 2016-04-11 DIAGNOSIS — E785 Hyperlipidemia, unspecified: Secondary | ICD-10-CM | POA: Diagnosis not present

## 2016-04-11 DIAGNOSIS — I2510 Atherosclerotic heart disease of native coronary artery without angina pectoris: Secondary | ICD-10-CM | POA: Diagnosis not present

## 2016-04-11 DIAGNOSIS — I10 Essential (primary) hypertension: Secondary | ICD-10-CM | POA: Diagnosis not present

## 2016-04-11 LAB — COMPREHENSIVE METABOLIC PANEL
ALBUMIN: 4.5 g/dL (ref 3.6–4.8)
ALT: 18 IU/L (ref 0–32)
AST: 27 IU/L (ref 0–40)
Albumin/Globulin Ratio: 1.6 (ref 1.2–2.2)
Alkaline Phosphatase: 80 IU/L (ref 39–117)
BUN / CREAT RATIO: 16 (ref 12–28)
BUN: 17 mg/dL (ref 8–27)
Bilirubin Total: 0.4 mg/dL (ref 0.0–1.2)
CO2: 27 mmol/L (ref 18–29)
Calcium: 9.5 mg/dL (ref 8.7–10.3)
Chloride: 97 mmol/L (ref 96–106)
Creatinine, Ser: 1.07 mg/dL — ABNORMAL HIGH (ref 0.57–1.00)
GFR calc non Af Amer: 55 mL/min/{1.73_m2} — ABNORMAL LOW (ref >59)
GFR, EST AFRICAN AMERICAN: 63 mL/min/{1.73_m2} (ref >59)
GLOBULIN, TOTAL: 2.8 g/dL (ref 1.5–4.5)
Glucose: 86 mg/dL (ref 65–99)
POTASSIUM: 4.1 mmol/L (ref 3.5–5.2)
Sodium: 138 mmol/L (ref 134–144)
TOTAL PROTEIN: 7.3 g/dL (ref 6.0–8.5)

## 2016-04-11 LAB — CBC WITH DIFFERENTIAL/PLATELET
BASOS: 0 %
Basophils Absolute: 0 10*3/uL (ref 0.0–0.2)
EOS (ABSOLUTE): 0.1 10*3/uL (ref 0.0–0.4)
Eos: 1 %
HEMATOCRIT: 39.9 % (ref 34.0–46.6)
Hemoglobin: 13.5 g/dL (ref 11.1–15.9)
IMMATURE GRANS (ABS): 0 10*3/uL (ref 0.0–0.1)
Immature Granulocytes: 0 %
LYMPHS: 37 %
Lymphocytes Absolute: 2.4 10*3/uL (ref 0.7–3.1)
MCH: 32 pg (ref 26.6–33.0)
MCHC: 33.8 g/dL (ref 31.5–35.7)
MCV: 95 fL (ref 79–97)
Monocytes Absolute: 0.3 10*3/uL (ref 0.1–0.9)
Monocytes: 4 %
NEUTROS ABS: 3.7 10*3/uL (ref 1.4–7.0)
Neutrophils: 58 %
Platelets: 251 10*3/uL (ref 150–379)
RBC: 4.22 x10E6/uL (ref 3.77–5.28)
RDW: 13.4 % (ref 12.3–15.4)
WBC: 6.6 10*3/uL (ref 3.4–10.8)

## 2016-04-11 LAB — LIPID PANEL
CHOL/HDL RATIO: 3 ratio (ref 0.0–4.4)
Cholesterol, Total: 139 mg/dL (ref 100–199)
HDL: 46 mg/dL (ref >39)
LDL CALC: 75 mg/dL (ref 0–99)
TRIGLYCERIDES: 90 mg/dL (ref 0–149)
VLDL Cholesterol Cal: 18 mg/dL (ref 5–40)

## 2016-04-11 LAB — TSH: TSH: 1.83 u[IU]/mL (ref 0.450–4.500)

## 2016-04-11 LAB — HEMOGLOBIN A1C
Est. average glucose Bld gHb Est-mCnc: 126 mg/dL
Hgb A1c MFr Bld: 6 % — ABNORMAL HIGH (ref 4.8–5.6)

## 2016-04-11 LAB — HEPATITIS C ANTIBODY: Hep C Virus Ab: <0.1 s/co ratio (ref 0.0–0.9)

## 2016-04-16 ENCOUNTER — Other Ambulatory Visit: Payer: Self-pay | Admitting: Internal Medicine

## 2016-06-24 ENCOUNTER — Ambulatory Visit
Admission: RE | Admit: 2016-06-24 | Discharge: 2016-06-24 | Disposition: A | Discharge status: Home / Self Care | Payer: Commercial Managed Care - HMO | Source: Ambulatory Visit | Attending: Internal Medicine | Admitting: Internal Medicine

## 2016-06-24 ENCOUNTER — Other Ambulatory Visit: Payer: Self-pay | Admitting: Internal Medicine

## 2016-06-24 DIAGNOSIS — Z1239 Encounter for other screening for malignant neoplasm of breast: Secondary | ICD-10-CM | POA: Insufficient documentation

## 2016-06-24 DIAGNOSIS — Z1231 Encounter for screening mammogram for malignant neoplasm of breast: Secondary | ICD-10-CM | POA: Insufficient documentation

## 2016-07-09 ENCOUNTER — Other Ambulatory Visit: Payer: Self-pay | Admitting: Internal Medicine

## 2016-07-09 MED ORDER — GABAPENTIN 300 MG PO CAPS: 300.0000 mg | ORAL_CAPSULE | Freq: Every day | ORAL | 1 refills | Status: DC

## 2016-07-09 MED ORDER — MELOXICAM 15 MG PO TABS: 15.0000 mg | ORAL_TABLET | Freq: Every day | ORAL | 1 refills | Status: DC

## 2016-11-08 ENCOUNTER — Encounter: Payer: Self-pay | Admitting: Internal Medicine

## 2016-11-08 ENCOUNTER — Ambulatory Visit (INDEPENDENT_AMBULATORY_CARE_PROVIDER_SITE_OTHER): Payer: Commercial Managed Care - HMO | Admitting: Internal Medicine

## 2016-11-08 VITALS — BP 118/76 | HR 84 | Temp 99.1°F | Ht 61.0 in | Wt 134.0 lb

## 2016-11-08 DIAGNOSIS — J0100 Acute maxillary sinusitis, unspecified: Secondary | ICD-10-CM | POA: Diagnosis not present

## 2016-11-08 DIAGNOSIS — H1033 Unspecified acute conjunctivitis, bilateral: Secondary | ICD-10-CM | POA: Diagnosis not present

## 2016-11-08 MED ORDER — AZITHROMYCIN 250 MG PO TABS: ORAL_TABLET | ORAL | 0 refills | Status: DC

## 2016-11-08 MED ORDER — NEOMYCIN-POLYMYXIN-DEXAMETH 3.5-10000-0.1 OP SUSP: 2.0000 [drp] | Freq: Four times a day (QID) | OPHTHALMIC | 0 refills | Status: DC

## 2016-11-08 NOTE — Progress Notes (Signed)
Date:  11/08/2016   Name:  Betty Vaughn   DOB:  [birth year]   MRN:  [id number]   Chief Complaint: Eye Problem Eye Problem   Both eyes are affected.This is a new problem. The current episode started 1 to 4 weeks ago. The problem occurs constantly. The problem has been unchanged. The injury mechanism is unknown. The pain is mild. Associated symptoms include an eye discharge, itching, photophobia and a recent URI. Pertinent negatives include no fever, nausea or vomiting. She has tried eye drops for the symptoms. The treatment provided mild relief.  Sinusitis  This is a new problem. The current episode started 1 to 4 weeks ago. The problem has been gradually worsening since onset. There has been no fever. Associated symptoms include congestion, coughing, sinus pressure and a sore throat. Pertinent negatives include no chills or headaches. Past treatments include nothing.      Review of Systems  Constitutional: Negative for chills and fever.  HENT: Positive for congestion, sinus pressure and sore throat.   Eyes: Positive for photophobia, discharge and itching.  Respiratory: Positive for cough.   Cardiovascular: Negative for chest pain and palpitations.  Gastrointestinal: Negative for nausea and vomiting.  Neurological: Negative for dizziness, numbness and headaches.    Patient Active Problem List   Diagnosis Date Noted  . Neuropathy ([place]) 04/10/2016  . Glucose intolerance (impaired glucose tolerance) 05/13/2015  . Benign essential tremor 05/13/2015  . Chronic insomnia 05/13/2015  . 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 Multiple joint complaints 05/13/2015  . Clinical depression 02/23/2014  . Acid reflux 02/23/2014  . Essential hypertension 02/23/2014  . HLD (hyperlipidemia) 02/23/2014  . CAD S/P percutaneous coronary angioplasty 03/16/2010    Prior to Admission medications   Medication Sig Start Date End Date Taking? Authorizing Provider  amLODipine (NORVASC) 5 MG tablet TAKE ONE  TABLET BY MOUTH EVERY DAY 04/16/16  Yes Glean Hess, MD  atorvastatin (LIPITOR) 40 MG tablet TAKE ONE TABLET BY MOUTH EVERY DAY 04/16/16  Yes Glean Hess, MD  Blood Glucose Monitoring Suppl (ACCU-CHEK AVIVA PLUS) W/DEVICE KIT use to TEST BLOOD SUGAR 06/21/15  Yes Glean Hess, MD  chlorthalidone (HYGROTON) 25 MG tablet TAKE ONE TABLET BY MOUTH EVERY DAY 04/16/16  Yes Glean Hess, MD  DULoxetine (CYMBALTA) 60 MG capsule Take 1 capsule by mouth daily.   Yes Historical Provider, MD  gabapentin (NEURONTIN) 300 MG capsule Take 1 capsule (300 mg total) by mouth at bedtime. 07/09/16  Yes Glean Hess, MD  glucose blood test strip  11/09/14  Yes Historical Provider, MD  Lancets (ACCU-CHEK MULTICLIX) lancets TEST BLOOD SUGAR daily or as directed by prescriber 07/09/15  Yes Glean Hess, MD  meloxicam (MOBIC) 15 MG tablet Take 1 tablet (15 mg total) by mouth daily. 07/09/16  Yes Glean Hess, MD  metoprolol (LOPRESSOR) 50 MG tablet Take 1 tablet by mouth 2 (two) times daily.   Yes Historical Provider, MD  potassium chloride SA (K-DUR,KLOR-CON) 20 MEQ tablet TAKE ONE TABLET BY MOUTH EVERY DAY 04/16/16  Yes Glean Hess, MD  traZODone (DESYREL) 50 MG tablet TAKE ONE TABLET BY MOUTH AT BEDTIME 04/16/16  Yes Glean Hess, MD  methocarbamol (ROBAXIN) 750 MG tablet Take 1 tablet by mouth 2 (two) times daily. 04/12/15   Historical Provider, MD  nitroGLYCERIN (NITROSTAT) 0.4 MG SL tablet Place under the tongue.    Historical Provider, MD    Allergies  Allergen Reactions  . Propoxyphene Anaphylaxis  .  Tramadol Anaphylaxis  . Penicillins Swelling  . Piroxicam Rash  . Sertraline Hcl Rash    Past Surgical History:  Procedure Laterality Date  . ABDOMINAL HYSTERECTOMY     total  . CERVICAL FUSION  2005  . CHOLECYSTECTOMY    . CORONARY ANGIOPLASTY WITH STENT PLACEMENT    . rigger finger      Social History  Substance Use Topics  . Smoking status: Current Every Day Smoker     Types: E-cigarettes  . Smokeless tobacco: Current User  . Alcohol use 2.4 oz/week    4 Standard drinks or equivalent per week     Medication list has been reviewed and updated.   Physical Exam  Constitutional: She is oriented to person, place, and time. She appears well-developed. No distress.  HENT:  Head: Normocephalic and atraumatic.  Nose: Right sinus exhibits maxillary sinus tenderness. Left sinus exhibits maxillary sinus tenderness.  Mouth/Throat: Posterior oropharyngeal erythema present.  Eyes: Right eye exhibits chemosis. Left eye exhibits chemosis. Right conjunctiva is injected. Left conjunctiva is injected.  Cardiovascular: Normal rate, regular rhythm and normal heart sounds.   Pulmonary/Chest: Effort normal and breath sounds normal. No respiratory distress.  Musculoskeletal: Normal range of motion.  Neurological: She is alert and oriented to person, place, and time.  Skin: Skin is warm and dry. No rash noted.  Psychiatric: She has a normal mood and affect. Her behavior is normal. Thought content normal.  Nursing note and vitals reviewed.   BP 118/76   Pulse 84   Temp 99.1 F (37.3 C)   Ht 5' 1"  (1.549 m)   Wt 134 lb (60.8 kg)   SpO2 98%   BMI 25.32 kg/m   Assessment and Plan: 1. Acute non-recurrent maxillary sinusitis - azithromycin (ZITHROMAX Z-PAK) 250 MG tablet; UAD  Dispense: 6 each; Refill: 0  2. Acute bacterial conjunctivitis of both eyes - neomycin-polymyxin b-dexamethasone (MAXITROL) 3.5-10000-0.1 SUSP; Place 2 drops into both eyes every 6 (six) hours.  Dispense: 5 mL; Refill: 0   Halina Maidens, MD [facility]  11/08/2016

## 2016-11-10 ENCOUNTER — Emergency Department: Payer: Commercial Managed Care - HMO

## 2016-11-10 ENCOUNTER — Emergency Department
Admission: EM | Admit: 2016-11-10 | Discharge: 2016-11-10 | Disposition: A | Discharge status: Home / Self Care | Payer: Commercial Managed Care - HMO | Attending: Emergency Medicine | Admitting: Emergency Medicine

## 2016-11-10 DIAGNOSIS — I2510 Atherosclerotic heart disease of native coronary artery without angina pectoris: Secondary | ICD-10-CM | POA: Diagnosis not present

## 2016-11-10 DIAGNOSIS — J019 Acute sinusitis, unspecified: Secondary | ICD-10-CM | POA: Diagnosis not present

## 2016-11-10 DIAGNOSIS — Z79899 Other long term (current) drug therapy: Secondary | ICD-10-CM | POA: Diagnosis not present

## 2016-11-10 DIAGNOSIS — E119 Type 2 diabetes mellitus without complications: Secondary | ICD-10-CM | POA: Diagnosis not present

## 2016-11-10 DIAGNOSIS — J069 Acute upper respiratory infection, unspecified: Secondary | ICD-10-CM | POA: Diagnosis not present

## 2016-11-10 DIAGNOSIS — Z85038 Personal history of other malignant neoplasm of large intestine: Secondary | ICD-10-CM | POA: Insufficient documentation

## 2016-11-10 DIAGNOSIS — T7840XA Allergy, unspecified, initial encounter: Secondary | ICD-10-CM | POA: Insufficient documentation

## 2016-11-10 DIAGNOSIS — F17210 Nicotine dependence, cigarettes, uncomplicated: Secondary | ICD-10-CM | POA: Diagnosis not present

## 2016-11-10 DIAGNOSIS — I10 Essential (primary) hypertension: Secondary | ICD-10-CM | POA: Diagnosis not present

## 2016-11-10 DIAGNOSIS — R05 Cough: Secondary | ICD-10-CM | POA: Diagnosis not present

## 2016-11-10 DIAGNOSIS — J329 Chronic sinusitis, unspecified: Secondary | ICD-10-CM

## 2016-11-10 LAB — CBC WITH DIFFERENTIAL/PLATELET
BASOS ABS: 0 10*3/uL (ref 0–0.1)
BASOS PCT: 1 %
EOS ABS: 0.2 10*3/uL (ref 0–0.7)
Eosinophils Relative: 2 %
HCT: 37.5 % (ref 35.0–47.0)
HEMOGLOBIN: 13 g/dL (ref 12.0–16.0)
Lymphocytes Relative: 36 %
Lymphs Abs: 2.8 10*3/uL (ref 1.0–3.6)
MCH: 31.9 pg (ref 26.0–34.0)
MCHC: 34.6 g/dL (ref 32.0–36.0)
MCV: 92.1 fL (ref 80.0–100.0)
MONOS PCT: 8 %
Monocytes Absolute: 0.6 10*3/uL (ref 0.2–0.9)
NEUTROS PCT: 53 %
Neutro Abs: 4 10*3/uL (ref 1.4–6.5)
Platelets: 229 10*3/uL (ref 150–440)
RBC: 4.08 MIL/uL (ref 3.80–5.20)
RDW: 12.5 % (ref 11.5–14.5)
WBC: 7.6 10*3/uL (ref 3.6–11.0)

## 2016-11-10 LAB — BASIC METABOLIC PANEL
Anion gap: 9 (ref 5–15)
BUN: 13 mg/dL (ref 6–20)
CALCIUM: 9.3 mg/dL (ref 8.9–10.3)
CHLORIDE: 102 mmol/L (ref 101–111)
CO2: 29 mmol/L (ref 22–32)
CREATININE: 1.02 mg/dL — AB (ref 0.44–1.00)
GFR calc non Af Amer: 56 mL/min — ABNORMAL LOW (ref >60)
Glucose, Bld: 131 mg/dL — ABNORMAL HIGH (ref 65–99)
Potassium: 3.2 mmol/L — ABNORMAL LOW (ref 3.5–5.1)
SODIUM: 140 mmol/L (ref 135–145)

## 2016-11-10 LAB — TROPONIN I

## 2016-11-10 MED ORDER — EPINEPHRINE 0.3 MG/0.3ML IJ SOAJ: 0.3000 mg | Freq: Once | INTRAMUSCULAR | 0 refills | Status: AC

## 2016-11-10 MED ORDER — METHYLPREDNISOLONE SODIUM SUCC 125 MG IJ SOLR
125.0000 mg | Freq: Once | INTRAMUSCULAR | Status: AC
  Administered 2016-11-10: 125 mg via INTRAVENOUS
  Filled 2016-11-10: qty 2

## 2016-11-10 MED ORDER — IPRATROPIUM-ALBUTEROL 0.5-2.5 (3) MG/3ML IN SOLN
9.0000 mL | Freq: Once | RESPIRATORY_TRACT | Status: AC
  Administered 2016-11-10: 9 mL via RESPIRATORY_TRACT
  Filled 2016-11-10: qty 9

## 2016-11-10 MED ORDER — PREDNISONE 20 MG PO TABS: 40.0000 mg | ORAL_TABLET | Freq: Every day | ORAL | 0 refills | Status: DC

## 2016-11-10 MED ORDER — ALBUTEROL SULFATE HFA 108 (90 BASE) MCG/ACT IN AERS: 2.0000 | INHALATION_SPRAY | Freq: Four times a day (QID) | RESPIRATORY_TRACT | 0 refills | Status: DC | PRN

## 2016-11-10 MED ORDER — DOXYCYCLINE HYCLATE 100 MG PO CAPS: 100.0000 mg | ORAL_CAPSULE | Freq: Two times a day (BID) | ORAL | 0 refills | Status: DC

## 2016-11-10 NOTE — ED Triage Notes (Signed)
Pt accompanied to triage by daughter, daughter states that pt was recently diagnosed with a sinus infection, started to take azithromycin today.  Pt reports that her chest feels tight, that her tongue and throat feel swollen.  She has labored breathing with exertion.  Pt's tongue appears slightly swollen at this time.  Pt able to talk normally in triage, but gets winded easily.  Pt ambulatory, but dyspneic with exertion in triage room.

## 2016-11-10 NOTE — ED Provider Notes (Signed)
[facility] Emergency Department Provider Note  ____________________________________________   First MD Initiated Contact with Patient 11/10/16 [phone]     (approximate)  I have reviewed the triage vital signs and the nursing notes.   HISTORY  Chief Complaint Allergic Reaction   HPI Betty Vaughn is a 65 y.o. female with a history of diabetes and hypertension who is presenting to the emergency department today with a feeling of tongue swelling as well as chest tightness. She says that she has had a frontal headache as well as sinusitis over the past week and just started azithromycin earlier today. Says that she took her first dose about 2 PM. Said that she started feeling her tongue swell and shortness of breath with a worsening cough at about 3 PM. She says that she has had severe allergic reactions in the past. Michela Pitcher that she needed to be placed on a ventilator after taking tramadol the past. She describes the chest pain is a tightness in his mild and constant to the midsternum. Says that she has a frontal headache at this time that worsens with coughing.   Past Medical History:  Diagnosis Date  . Allergy   . Chronic insomnia   . Colon cancer ([place])    polyp was cancerous  . Diabetes mellitus without complication ([place])   . Hx of colonic polyps   . Hyperlipidemia   . Hypertension   . Neuromuscular disorder ([place])   . Psoriasis    scalp    Patient Active Problem List   Diagnosis Date Noted  . Neuropathy ([place]) 04/10/2016  . Glucose intolerance (impaired glucose tolerance) 05/13/2015  . Benign essential tremor 05/13/2015  . Chronic insomnia 05/13/2015  . 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 Multiple joint complaints 05/13/2015  . Clinical depression 02/23/2014  . Acid reflux 02/23/2014  . Essential hypertension 02/23/2014  . HLD (hyperlipidemia) 02/23/2014  . CAD S/P percutaneous coronary angioplasty 03/16/2010    Past Surgical History:  Procedure  Laterality Date  . ABDOMINAL HYSTERECTOMY     total  . CERVICAL FUSION  2005  . CHOLECYSTECTOMY    . CORONARY ANGIOPLASTY WITH STENT PLACEMENT    . rigger finger      Prior to Admission medications   Medication Sig Start Date End Date Taking? Authorizing Provider  amLODipine (NORVASC) 5 MG tablet TAKE ONE TABLET BY MOUTH EVERY DAY 04/16/16   Glean Hess, MD  atorvastatin (LIPITOR) 40 MG tablet TAKE ONE TABLET BY MOUTH EVERY DAY 04/16/16   Glean Hess, MD  azithromycin (ZITHROMAX Z-PAK) 250 MG tablet UAD 11/08/16   Glean Hess, MD  Blood Glucose Monitoring Suppl (ACCU-CHEK AVIVA PLUS) W/DEVICE KIT use to TEST BLOOD SUGAR 06/21/15   Glean Hess, MD  chlorthalidone (HYGROTON) 25 MG tablet TAKE ONE TABLET BY MOUTH EVERY DAY 04/16/16   Glean Hess, MD  DULoxetine (CYMBALTA) 60 MG capsule Take 1 capsule by mouth daily.    Historical Provider, MD  gabapentin (NEURONTIN) 300 MG capsule Take 1 capsule (300 mg total) by mouth at bedtime. 07/09/16   Glean Hess, MD  glucose blood test strip  11/09/14   Historical Provider, MD  Lancets (ACCU-CHEK MULTICLIX) lancets TEST BLOOD SUGAR daily or as directed by prescriber 07/09/15   Glean Hess, MD  meloxicam (MOBIC) 15 MG tablet Take 1 tablet (15 mg total) by mouth daily. 07/09/16   Glean Hess, MD  methocarbamol (ROBAXIN) 750 MG tablet Take 1 tablet by mouth 2 (  two) times daily. 04/12/15   Historical Provider, MD  metoprolol (LOPRESSOR) 50 MG tablet Take 1 tablet by mouth 2 (two) times daily.    Historical Provider, MD  neomycin-polymyxin b-dexamethasone (MAXITROL) 3.5-10000-0.1 SUSP Place 2 drops into both eyes every 6 (six) hours. 11/08/16   Glean Hess, MD  nitroGLYCERIN (NITROSTAT) 0.4 MG SL tablet Place under the tongue.    Historical Provider, MD  potassium chloride SA (K-DUR,KLOR-CON) 20 MEQ tablet TAKE ONE TABLET BY MOUTH EVERY DAY 04/16/16   Glean Hess, MD  traZODone (DESYREL) 50 MG tablet TAKE ONE TABLET  BY MOUTH AT BEDTIME 04/16/16   Glean Hess, MD    Allergies Propoxyphene; Tramadol; Penicillins; Piroxicam; and Sertraline hcl  Family History  Problem Relation Age of Onset  . Breast cancer Mother 4  . Diabetes Mother   . CAD Father   . Diabetes Daughter     Social History Social History  Substance Use Topics  . Smoking status: Current Every Day Smoker    Types: E-cigarettes  . Smokeless tobacco: Current User  . Alcohol use 2.4 oz/week    4 Standard drinks or equivalent per week    Review of Systems Constitutional: No fever/chills Eyes: No visual changes. ENT: No sore throat. Cardiovascular: As above Respiratory: As above Gastrointestinal: No abdominal pain.  No nausea, no vomiting.  No diarrhea.  No constipation. Genitourinary: Negative for dysuria. Musculoskeletal: Negative for back pain. Skin: Negative for rash. Neurological: Negative for headaches, focal weakness or numbness.  10-point ROS otherwise negative.  ____________________________________________   PHYSICAL EXAM:  VITAL SIGNS: ED Triage Vitals  Enc Vitals Group     BP 11/10/16 0031 (!) 157/89     Pulse Rate 11/10/16 0031 87     Resp 11/10/16 0031 (!) 24     Temp 11/10/16 0030 98.1 F (36.7 C)     Temp Source 11/10/16 0030 Oral     SpO2 11/10/16 0031 95 %     Weight 11/10/16 0030 134 lb (60.8 kg)     Height 11/10/16 0030 5' 1"  (1.549 m)     Head Circumference --      Peak Flow --      Pain Score --      Pain Loc --      Pain Edu? --      Excl. in [place]? --     Constitutional: Alert and oriented. Well appearing and in no acute distress. Eyes: Conjunctivae are normal. PERRL. EOMI. Head: Atraumatic. Nose: No congestion/rhinnorhea. Mouth/Throat: Mucous membranes are moist.  Oropharynx non-erythematous.  No definitive tongue swelling. No uvular swelling. No stridor. Neck: No stridor.   Cardiovascular: Normal rate, regular rhythm. Grossly normal heart sounds.  Good peripheral  circulation. Respiratory: Normal respiratory effort.  No retractions. Lungs CTAB. Intermittent coughing. Gastrointestinal: Soft and nontender. No distention. No CVA tenderness. Musculoskeletal: No lower extremity tenderness nor edema.  No joint effusions. Neurologic:  Normal speech and language. No gross focal neurologic deficits are appreciated.  Skin:  Skin is warm, dry and intact. No rash noted. Psychiatric: Mood and affect are normal. Speech and behavior are normal.  ____________________________________________   LABS (all labs ordered are listed, but only abnormal results are displayed)  Labs Reviewed  BASIC METABOLIC PANEL - Abnormal; Notable for the following:       Result Value   Potassium 3.2 (*)    Glucose, Bld 131 (*)    Creatinine, Ser 1.02 (*)    GFR calc non Af Wyvonnia Lora  56 (*)    All other components within normal limits  CBC WITH DIFFERENTIAL/PLATELET  TROPONIN I   ____________________________________________  EKG  ED ECG REPORT I, [place],  Youlanda Roys, the attending physician, personally viewed and interpreted this ECG.   Date: 11/10/2016  EKG Time: 0030  Rate: 80  Rhythm: normal sinus rhythm  Axis: Normal  Intervals:none  ST&T Change: No ST segment elevation or depression. No abnormal T-wave inversion.  ____________________________________________  RADIOLOGY  DG Chest 1 View (Final result)  Result time 11/10/16 02:15:01  Final result by Anner Crete, MD (11/10/16 02:15:01)           Narrative:   CLINICAL DATA: 65 year old female with cough and chest tightness.  EXAM: CHEST 1 VIEW  COMPARISON: Chest radiograph dated 07/06/2013  FINDINGS: The lungs are clear. There is no pleural effusion or pneumothorax. The cardiac silhouette is within normal limits. Lower cervical fixation hardware. No acute osseous pathology.  IMPRESSION: No active disease.   Electronically Signed By: Anner Crete M.D. On: 11/10/2016 02:15           ____________________________________________   PROCEDURES  Procedure(s) performed:   Procedures  Critical Care performed:   ____________________________________________   INITIAL IMPRESSION / ASSESSMENT AND PLAN / ED COURSE  Pertinent labs & imaging results that were available during my care of the patient were reviewed by me and considered in my medical decision making (see chart for details).   Clinical Course    ----------------------------------------- 4:27 AM on 11/10/2016 -----------------------------------------  Patient is resting a little bit this time. Says that her tongue no longer feel swollen. No respiratory distress. We'll discharge with steroids as well as an albuterol inhaler. We will change her antibiotics to doxycycline.  ____________________________________________   FINAL CLINICAL IMPRESSION(S) / ED DIAGNOSES  Allergic reaction. Sinusitis. URI.    NEW MEDICATIONS STARTED DURING THIS VISIT:  New Prescriptions   No medications on file     Note:  This document was prepared using Dragon voice recognition software and may include unintentional dictation errors.    Orbie Pyo, MD 11/10/16 [phone]

## 2016-11-10 NOTE — ED Notes (Signed)
Pt presents to ED 03 c/o difficulty breathing due to a scratchy throat; pt states she started taking an antibiotic prescribed by her PCP for sinus infection earlier yesterday about 1400; then around 1600 pt states her head started hurting; around 2000 pt states feeling lightheaded and dizzy and decided to come to the ED; pt states coughing has been going on for past 2 weeks, pt states going to see the PCP on 11/08/16 and received prescription for antibiotic; pt also states she took one dose of nitro around 2100 when her chest started feeling tight; pt states chest tightness relief post nitro administration; pt is awake and alert x4; able to speak in complete sentences; O2 sats at 98% on room air; lungs sounds clear bilaterally in all fields; pt denies any nausea, vomiting, dizziness, lightheadedness or visual changes

## 2016-11-19 ENCOUNTER — Encounter: Payer: Self-pay | Admitting: Internal Medicine

## 2016-11-20 ENCOUNTER — Ambulatory Visit (INDEPENDENT_AMBULATORY_CARE_PROVIDER_SITE_OTHER): Payer: Commercial Managed Care - HMO | Admitting: Internal Medicine

## 2016-11-20 ENCOUNTER — Encounter: Payer: Self-pay | Admitting: Internal Medicine

## 2016-11-20 VITALS — BP 140/82 | HR 72 | Temp 98.0°F | Ht 61.0 in | Wt 131.0 lb

## 2016-11-20 DIAGNOSIS — J0100 Acute maxillary sinusitis, unspecified: Secondary | ICD-10-CM

## 2016-11-20 DIAGNOSIS — I10 Essential (primary) hypertension: Secondary | ICD-10-CM | POA: Diagnosis not present

## 2016-11-20 DIAGNOSIS — F17200 Nicotine dependence, unspecified, uncomplicated: Secondary | ICD-10-CM | POA: Insufficient documentation

## 2016-11-20 MED ORDER — VARENICLINE TARTRATE 0.5 MG X 11 & 1 MG X 42 PO MISC: ORAL | 0 refills | Status: DC

## 2016-11-20 MED ORDER — VARENICLINE TARTRATE 1 MG PO TABS: 1.0000 mg | ORAL_TABLET | Freq: Two times a day (BID) | ORAL | 3 refills | Status: DC

## 2016-11-20 NOTE — Progress Notes (Signed)
Date:  11/20/2016   Name:  Betty Vaughn   DOB:  [birth year]   MRN:  [id number]   Chief Complaint: URI (follow up) URI   This is a new problem. The current episode started 1 to 4 weeks ago. The problem has been resolved. There has been no fever. Pertinent negatives include no chest pain, congestion, coughing, sinus pain or sore throat. She has tried decongestant (and completed course of Doxycycline) for the symptoms.  Hypertension  This is a chronic problem. The current episode started more than 1 year ago. The problem is unchanged. The problem is controlled. Pertinent negatives include no chest pain, palpitations or shortness of breath.  Nicotine Dependence  Presents for follow-up visit. Symptoms are negative for fatigue and sore throat. Her urge triggers include company of smokers. She smokes 1 pack of cigarettes per day. Compliance with prior treatments: interested in quitting but did not have good results from patches or e-cigs.   Allergic reaction - had tongue swelling from Zpak.  Resolved after prednisone taper.   Review of Systems  Constitutional: Negative for chills, fatigue and fever.  HENT: Positive for sinus pressure. Negative for congestion, postnasal drip, sinus pain, sore throat and trouble swallowing.   Respiratory: Negative for cough, chest tightness and shortness of breath.   Cardiovascular: Negative for chest pain and palpitations.    Patient Active Problem List   Diagnosis Date Noted  . Tobacco use disorder 11/20/2016  . Neuropathy ([place]) 04/10/2016  . Glucose intolerance (impaired glucose tolerance) 05/13/2015  . Benign essential tremor 05/13/2015  . Chronic insomnia 05/13/2015  . 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 Multiple joint complaints 05/13/2015  . Clinical depression 02/23/2014  . Acid reflux 02/23/2014  . Essential hypertension 02/23/2014  . HLD (hyperlipidemia) 02/23/2014  . Facet arthritis of lumbar region ([place]) 01/16/2012  . Chronic headache  disorder 01/16/2012  . 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 CAD S/P percutaneous coronary angioplasty 03/16/2010    Prior to Admission medications   Medication Sig Start Date End Date Taking? Authorizing Provider  albuterol (PROVENTIL HFA;VENTOLIN HFA) 108 (90 Base) MCG/ACT inhaler Inhale 2 puffs into the lungs every 6 (six) hours as needed for wheezing or shortness of breath. 11/10/16  Yes Orbie Pyo, MD  amLODipine (NORVASC) 5 MG tablet TAKE ONE TABLET BY MOUTH EVERY DAY 04/16/16  Yes Glean Hess, MD  atorvastatin (LIPITOR) 40 MG tablet TAKE ONE TABLET BY MOUTH EVERY DAY 04/16/16  Yes Glean Hess, MD  Blood Glucose Monitoring Suppl (ACCU-CHEK AVIVA PLUS) W/DEVICE KIT use to TEST BLOOD SUGAR 06/21/15  Yes Glean Hess, MD  chlorthalidone (HYGROTON) 25 MG tablet TAKE ONE TABLET BY MOUTH EVERY DAY 04/16/16  Yes Glean Hess, MD  DULoxetine (CYMBALTA) 60 MG capsule Take 1 capsule by mouth daily.   Yes Historical Provider, MD  gabapentin (NEURONTIN) 300 MG capsule Take 1 capsule (300 mg total) by mouth at bedtime. 07/09/16  Yes Glean Hess, MD  glucose blood test strip  11/09/14  Yes Historical Provider, MD  Lancets (ACCU-CHEK MULTICLIX) lancets TEST BLOOD SUGAR daily or as directed by prescriber 07/09/15  Yes Glean Hess, MD  meloxicam (MOBIC) 15 MG tablet Take 1 tablet (15 mg total) by mouth daily. 07/09/16  Yes Glean Hess, MD  methocarbamol (ROBAXIN) 750 MG tablet Take 1 tablet by mouth 2 (two) times daily. 04/12/15  Yes Historical Provider, MD  metoprolol (LOPRESSOR) 50 MG  tablet Take 1 tablet by mouth 2 (two) times daily.   Yes Historical Provider, MD  neomycin-polymyxin b-dexamethasone (MAXITROL) 3.5-10000-0.1 SUSP Place 2 drops into both eyes every 6 (six) hours. 11/08/16  Yes Glean Hess, MD  nitroGLYCERIN (NITROSTAT) 0.4 MG SL tablet Place under the tongue.   Yes Historical Provider, MD  potassium  chloride SA (K-DUR,KLOR-CON) 20 MEQ tablet TAKE ONE TABLET BY MOUTH EVERY DAY 04/16/16  Yes Glean Hess, MD  predniSONE (DELTASONE) 20 MG tablet Take 2 tablets (40 mg total) by mouth daily. 11/10/16 11/10/17 Yes Orbie Pyo, MD  traZODone (DESYREL) 50 MG tablet TAKE ONE TABLET BY MOUTH AT BEDTIME 04/16/16  Yes Glean Hess, MD    Allergies  Allergen Reactions  . Azithromycin Swelling  . Propoxyphene Anaphylaxis  . Tramadol Anaphylaxis  . Penicillins Swelling  . Piroxicam Rash  . Sertraline Hcl Rash    Past Surgical History:  Procedure Laterality Date  . ABDOMINAL HYSTERECTOMY     total  . CERVICAL FUSION  2005  . CHOLECYSTECTOMY    . CORONARY ANGIOPLASTY WITH STENT PLACEMENT    . rigger finger      Social History  Substance Use Topics  . Smoking status: Current Every Day Smoker    Types: E-cigarettes  . Smokeless tobacco: Current User  . Alcohol use 2.4 oz/week    4 Standard drinks or equivalent per week     Medication list has been reviewed and updated.   Physical Exam  Constitutional: She is oriented to person, place, and time. She appears well-developed. No distress.  HENT:  Head: Normocephalic and atraumatic.  Right Ear: Tympanic membrane and ear canal normal.  Left Ear: Tympanic membrane and ear canal normal.  Nose: Right sinus exhibits frontal sinus tenderness. Right sinus exhibits no maxillary sinus tenderness. Left sinus exhibits no maxillary sinus tenderness and no frontal sinus tenderness.  Mouth/Throat: Uvula is midline. No posterior oropharyngeal edema or posterior oropharyngeal erythema.  Pulmonary/Chest: Effort normal. No respiratory distress.  Musculoskeletal: Normal range of motion.  Neurological: She is alert and oriented to person, place, and time.  Skin: Skin is warm and dry. No rash noted.  Psychiatric: She has a normal mood and affect. Her behavior is normal. Thought content normal.  Nursing note and vitals reviewed.   BP  140/82   Pulse 72   Temp 98 F (36.7 C)   Ht _0  (1.549 m)   Wt 131 lb (59.4 kg)   SpO2 98%   BMI 24.75 kg/m   Assessment and Plan: 1. Acute non-recurrent maxillary sinusitis Appears resolved Consider Claritin PRN for sinus congestion  2. Tobacco use disorder Interested in quitting - chantix discussed - varenicline (CHANTIX STARTING MONTH PAK) 0.5 MG X 11 & 1 MG X 42 tablet; Take one 0.5 mg tablet by mouth once daily for 3 days, then increase to one 0.5 mg tablet twice daily for 4 days, then increase to one 1 mg tablet twice daily.  Dispense: 53 tablet; Refill: 0 - varenicline (CHANTIX CONTINUING MONTH PAK) 1 MG tablet; Take 1 tablet (1 mg total) by mouth 2 (two) times daily.  Dispense: 60 tablet; Refill: 3  3. Essential hypertension Controlled - continue current medication   Halina Maidens, MD [facility]  11/20/2016

## 2016-11-20 NOTE — Patient Instructions (Signed)
Steps to Quit Smoking Smoking tobacco can be harmful to your health and can affect almost every organ in your body. Smoking puts you, and those around you, at risk for developing many serious chronic diseases. Quitting smoking is difficult, but it is one of the best things that you can do for your health. It is never too late to quit. What are the benefits of quitting smoking? When you quit smoking, you lower your risk of developing serious diseases and conditions, such as:  Lung cancer or lung disease, such as COPD.  Heart disease.  Stroke.  Heart attack.  Infertility.  Osteoporosis and bone fractures.  Additionally, symptoms such as coughing, wheezing, and shortness of breath may get better when you quit. You may also find that you get sick less often because your body is stronger at fighting off colds and infections. If you are pregnant, quitting smoking can help to reduce your chances of having a baby of low birth weight. How do I get ready to quit? When you decide to quit smoking, create a plan to make sure that you are successful. Before you quit:  Pick a date to quit. Set a date within the next two weeks to give you time to prepare.  Write down the reasons why you are quitting. Keep this list in places where you will see it often, such as on your bathroom mirror or in your car or wallet.  Identify the people, places, things, and activities that make you want to smoke (triggers) and avoid them. Make sure to take these actions: ? Throw away all cigarettes at home, at work, and in your car. ? Throw away smoking accessories, such as ashtrays and lighters. ? Clean your car and make sure to empty the ashtray. ? Clean your home, including curtains and carpets.  Tell your family, friends, and coworkers that you are quitting. Support from your loved ones can make quitting easier.  Talk with your health care provider about your options for quitting smoking.  Find out what treatment  options are covered by your health insurance.  What strategies can I use to quit smoking? Talk with your healthcare provider about different strategies to quit smoking. Some strategies include:  Quitting smoking altogether instead of gradually lessening how much you smoke over a period of time. Research shows that quitting "cold turkey" is more successful than gradually quitting.  Attending in-person counseling to help you build problem-solving skills. You are more likely to have success in quitting if you attend several counseling sessions. Even short sessions of 10 minutes can be effective.  Finding resources and support systems that can help you to quit smoking and remain smoke-free after you quit. These resources are most helpful when you use them often. They can include: ? Online chats with a counselor. ? Telephone quitlines. ? Printed self-help materials. ? Support groups or group counseling. ? Text messaging programs. ? Mobile phone applications.  Taking medicines to help you quit smoking. (If you are pregnant or breastfeeding, talk with your health care provider first.) Some medicines contain nicotine and some do not. Both types of medicines help with cravings, but the medicines that include nicotine help to relieve withdrawal symptoms. Your health care provider may recommend: ? Nicotine patches, gum, or lozenges. ? Nicotine inhalers or sprays. ? Non-nicotine medicine that is taken by mouth.  Talk with your health care provider about combining strategies, such as taking medicines while you are also receiving in-person counseling. Using these two strategies together   makes you more likely to succeed in quitting than if you used either strategy on its own. If you are pregnant or breastfeeding, talk with your health care provider about finding counseling or other support strategies to quit smoking. Do not take medicine to help you quit smoking unless told to do so by your health care  provider. What things can I do to make it easier to quit? Quitting smoking might feel overwhelming at first, but there is a lot that you can do to make it easier. Take these important actions:  Reach out to your family and friends and ask that they support and encourage you during this time. Call telephone quitlines, reach out to support groups, or work with a counselor for support.  Ask people who smoke to avoid smoking around you.  Avoid places that trigger you to smoke, such as bars, parties, or smoke-break areas at work.  Spend time around people who do not smoke.  Lessen stress in your life, because stress can be a smoking trigger for some people. To lessen stress, try: ? Exercising regularly. ? Deep-breathing exercises. ? Yoga. ? Meditating. ? Performing a body scan. This involves closing your eyes, scanning your body from head to toe, and noticing which parts of your body are particularly tense. Purposefully relax the muscles in those areas.  Download or purchase mobile phone or tablet apps (applications) that can help you stick to your quit plan by providing reminders, tips, and encouragement. There are many free apps, such as QuitGuide from the CDC (Centers for Disease Control and Prevention). You can find other support for quitting smoking (smoking cessation) through smokefree.gov and other websites.  How will I feel when I quit smoking? Within the first 24 hours of quitting smoking, you may start to feel some withdrawal symptoms. These symptoms are usually most noticeable 2-3 days after quitting, but they usually do not last beyond 2-3 weeks. Changes or symptoms that you might experience include:  Mood swings.  Restlessness, anxiety, or irritation.  Difficulty concentrating.  Dizziness.  Strong cravings for sugary foods in addition to nicotine.  Mild weight gain.  Constipation.  Nausea.  Coughing or a sore throat.  Changes in how your medicines work in your  body.  A depressed mood.  Difficulty sleeping (insomnia).  After the first 2-3 weeks of quitting, you may start to notice more positive results, such as:  Improved sense of smell and taste.  Decreased coughing and sore throat.  Slower heart rate.  Lower blood pressure.  Clearer skin.  The ability to breathe more easily.  Fewer sick days.  Quitting smoking is very challenging for most people. Do not get discouraged if you are not successful the first time. Some people need to make many attempts to quit before they achieve long-term success. Do your best to stick to your quit plan, and talk with your health care provider if you have any questions or concerns. This information is not intended to replace advice given to you by your health care provider. Make sure you discuss any questions you have with your health care provider. Document Released: 11/05/2001 Document Revised: 07/09/2016 Document Reviewed: 03/28/2015 Elsevier Interactive Patient Education  2017 [employer]  

## 2016-12-03 ENCOUNTER — Ambulatory Visit: Payer: Commercial Managed Care - HMO | Admitting: Internal Medicine

## 2016-12-25 ENCOUNTER — Other Ambulatory Visit: Payer: Self-pay | Admitting: Internal Medicine

## 2017-01-23 ENCOUNTER — Other Ambulatory Visit: Payer: Self-pay | Admitting: Internal Medicine

## 2017-01-24 ENCOUNTER — Telehealth: Payer: Self-pay

## 2017-01-24 NOTE — Telephone Encounter (Signed)
Called pt and left VM to find out about faxed prescriptions sent to Dr. [name]. Waiting for call back. Need to know if patient no longer uses [facility].

## 2017-02-04 ENCOUNTER — Other Ambulatory Visit: Payer: Self-pay

## 2017-02-04 ENCOUNTER — Other Ambulatory Visit: Payer: Self-pay | Admitting: Internal Medicine

## 2017-02-04 MED ORDER — POTASSIUM CHLORIDE CRYS ER 20 MEQ PO TBCR: 20.0000 meq | EXTENDED_RELEASE_TABLET | Freq: Every day | ORAL | 1 refills | Status: DC

## 2017-02-04 MED ORDER — ATORVASTATIN CALCIUM 40 MG PO TABS: 40.0000 mg | ORAL_TABLET | Freq: Every day | ORAL | 1 refills | Status: DC

## 2017-02-04 MED ORDER — METOPROLOL TARTRATE 50 MG PO TABS: 50.0000 mg | ORAL_TABLET | Freq: Two times a day (BID) | ORAL | 5 refills | Status: DC

## 2017-02-04 MED ORDER — TRAZODONE HCL 50 MG PO TABS: 50.0000 mg | ORAL_TABLET | Freq: Every day | ORAL | 1 refills | Status: DC

## 2017-02-04 MED ORDER — GABAPENTIN 300 MG PO CAPS: 300.0000 mg | ORAL_CAPSULE | Freq: Every day | ORAL | 5 refills | Status: DC

## 2017-02-04 MED ORDER — MELOXICAM 15 MG PO TABS: 15.0000 mg | ORAL_TABLET | Freq: Every day | ORAL | 5 refills | Status: DC

## 2017-02-04 MED ORDER — CHLORTHALIDONE 25 MG PO TABS: 25.0000 mg | ORAL_TABLET | Freq: Every day | ORAL | 1 refills | Status: DC

## 2017-02-04 MED ORDER — DULOXETINE HCL 60 MG PO CPEP: 60.0000 mg | ORAL_CAPSULE | Freq: Every day | ORAL | 5 refills | Status: DC

## 2017-02-04 MED ORDER — AMLODIPINE BESYLATE 5 MG PO TABS: 5.0000 mg | ORAL_TABLET | Freq: Every day | ORAL | 1 refills | Status: DC

## 2017-04-28 ENCOUNTER — Ambulatory Visit (INDEPENDENT_AMBULATORY_CARE_PROVIDER_SITE_OTHER): Payer: Medicare HMO | Admitting: Internal Medicine

## 2017-04-28 ENCOUNTER — Encounter: Payer: Self-pay | Admitting: Internal Medicine

## 2017-04-28 VITALS — BP 128/68 | HR 64 | Ht 61.0 in | Wt 133.0 lb

## 2017-04-28 DIAGNOSIS — Z9862 Peripheral vascular angioplasty status: Secondary | ICD-10-CM | POA: Diagnosis not present

## 2017-04-28 DIAGNOSIS — I10 Essential (primary) hypertension: Secondary | ICD-10-CM

## 2017-04-28 DIAGNOSIS — R309 Painful micturition, unspecified: Secondary | ICD-10-CM | POA: Diagnosis not present

## 2017-04-28 DIAGNOSIS — R1084 Generalized abdominal pain: Secondary | ICD-10-CM | POA: Diagnosis not present

## 2017-04-28 DIAGNOSIS — R7302 Impaired glucose tolerance (oral): Secondary | ICD-10-CM | POA: Diagnosis not present

## 2017-04-28 DIAGNOSIS — Z0000 Encounter for general adult medical examination without abnormal findings: Secondary | ICD-10-CM | POA: Diagnosis not present

## 2017-04-28 DIAGNOSIS — Z9861 Coronary angioplasty status: Secondary | ICD-10-CM | POA: Diagnosis not present

## 2017-04-28 DIAGNOSIS — F324 Major depressive disorder, single episode, in partial remission: Secondary | ICD-10-CM

## 2017-04-28 DIAGNOSIS — E782 Mixed hyperlipidemia: Secondary | ICD-10-CM

## 2017-04-28 DIAGNOSIS — I2510 Atherosclerotic heart disease of native coronary artery without angina pectoris: Secondary | ICD-10-CM | POA: Diagnosis not present

## 2017-04-28 DIAGNOSIS — Z1239 Encounter for other screening for malignant neoplasm of breast: Secondary | ICD-10-CM

## 2017-04-28 DIAGNOSIS — G629 Polyneuropathy, unspecified: Secondary | ICD-10-CM

## 2017-04-28 LAB — POCT URINALYSIS DIPSTICK
Bilirubin, UA: NEGATIVE
Glucose, UA: NEGATIVE
KETONES UA: NEGATIVE
LEUKOCYTES UA: NEGATIVE
NITRITE UA: NEGATIVE
PH UA: 6 (ref 5.0–8.0)
PROTEIN UA: NEGATIVE
RBC UA: NEGATIVE
Spec Grav, UA: 1.015 (ref 1.010–1.025)
Urobilinogen, UA: 0.2 E.U./dL

## 2017-04-28 NOTE — Patient Instructions (Signed)
Health Maintenance  Topic Date Due  . HIV Screening  09/19/1966  . DEXA SCAN  09/19/2016  . PNA vac Low Risk Adult (1 of 2 - PCV13) 09/19/2016  . COLONOSCOPY  05/09/2017  . INFLUENZA VACCINE  06/25/2017  . MAMMOGRAM  06/24/2018  . TETANUS/TDAP  04/11/2026  . Hepatitis C Screening  Completed

## 2017-04-28 NOTE — Progress Notes (Signed)
Patient: Betty Vaughn, Female    DOB: 1951/09/12, 66 y.o.   MRN: [id number] Visit Date: 04/28/2017  Today's Provider: Halina Maidens, MD   Chief Complaint  Patient presents with  . Medicare Wellness    Breast EXAM  . pain during urination    Pt also wants to discuss repetitive yeast infections. And pain during urination. Having to push hard to urinate. Hurts worse in morning time. Pain is in lower pelvic region.    . Hypertension  . Hyperlipidemia   Subjective:    Annual wellness visit Betty Vaughn is a 66 y.o. female who presents today for her Subsequent Annual Wellness Visit. She feels fairly well. She reports exercising little. She reports she is sleeping fairly well. Mammogram is due in July.  She continues to smoke.  She believes that she had a pneumonia vaccine at her pharmacy this past winter.  ----------------------------------------------------------- Hypertension  This is a chronic problem. The problem is unchanged. Pertinent negatives include no chest pain, headaches, palpitations or shortness of breath. Risk factors for coronary artery disease include dyslipidemia. Past treatments include beta blockers and calcium channel blockers. The current treatment provides significant improvement. Hypertensive end-organ damage includes CAD/MI.  Hyperlipidemia  This is a chronic problem. Pertinent negatives include no chest pain or shortness of breath. Current antihyperlipidemic treatment includes statins. The current treatment provides significant improvement of lipids. Risk factors for coronary artery disease include dyslipidemia.  Vaginal Itching  The patient's primary symptoms include pelvic pain. The patient's pertinent negatives include no vaginal discharge. The problem occurs intermittently (several self treated yeast infections). Associated symptoms include abdominal pain. Pertinent negatives include no chills, constipation, diarrhea, dysuria, fever, frequency, headaches,  rash or vomiting.  Abdominal Pain  This is a chronic problem. The current episode started more than 1 month ago. The onset quality is undetermined. The problem occurs constantly. The problem has been unchanged. The pain is located in the generalized abdominal region, left flank and right flank. The pain is moderate. The quality of the pain is sharp. Pertinent negatives include no arthralgias, constipation, diarrhea, dysuria, fever, frequency, headaches or vomiting.  Depression         This is a chronic problem.The problem is unchanged.  Associated symptoms include no fatigue and no headaches.  Past treatments include SNRIs - Serotonin and norepinephrine reuptake inhibitors.  Compliance with treatment is good.  Previous treatment provided significant relief. CAD - no chest pain or SOB.  No LE edema.  Taking medication as directed. No cardiology follow up for 2 years.  Review of Systems  Constitutional: Negative for chills, fatigue and fever.  HENT: Negative for congestion, hearing loss, tinnitus, trouble swallowing and voice change.   Eyes: Negative for visual disturbance.  Respiratory: Negative for cough, chest tightness, shortness of breath and wheezing.   Cardiovascular: Negative for chest pain, palpitations and leg swelling.  Gastrointestinal: Positive for abdominal pain. Negative for abdominal distention, anal bleeding, blood in stool, constipation, diarrhea and vomiting.  Endocrine: Negative for polydipsia and polyuria.  Genitourinary: Positive for difficulty urinating (struggles to empty bladder, esp in AM) and pelvic pain. Negative for dysuria, frequency, genital sores, vaginal bleeding and vaginal discharge.  Musculoskeletal: Negative for arthralgias, gait problem and joint swelling.  Skin: Negative for color change and rash.  Neurological: Negative for dizziness, tremors, light-headedness and headaches.  Hematological: Negative for adenopathy. Does not bruise/bleed easily.    Psychiatric/Behavioral: Positive for depression. Negative for dysphoric mood and sleep disturbance. The patient is not nervous/anxious.  Social History   Social History  . Marital status: Married    Spouse name: N/A  . Number of children: N/A  . Years of education: N/A   Occupational History  . Not on file.   Social History Main Topics  . Smoking status: Current Every Day Smoker    Types: E-cigarettes  . Smokeless tobacco: Current User  . Alcohol use 2.4 oz/week    4 Standard drinks or equivalent per week  . Drug use: No  . Sexual activity: Not on file   Other Topics Concern  . Not on file   Social History Narrative  . No narrative on file    Patient Active Problem List   Diagnosis Date Noted  . Tobacco use disorder 11/20/2016  . Neuropathy 04/10/2016  . Glucose intolerance (impaired glucose tolerance) 05/13/2015  . Benign essential tremor 05/13/2015  . Chronic insomnia 05/13/2015  . 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 Multiple joint complaints 05/13/2015  . Clinical depression 02/23/2014  . Acid reflux 02/23/2014  . Essential hypertension 02/23/2014  . HLD (hyperlipidemia) 02/23/2014  . Facet arthritis of lumbar region ([place]) 01/16/2012  . Chronic headache disorder 01/16/2012  . 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 CAD S/P percutaneous coronary angioplasty 03/16/2010    Past Surgical History:  Procedure Laterality Date  . ABDOMINAL HYSTERECTOMY     total  . CERVICAL FUSION  2005  . CHOLECYSTECTOMY    . CORONARY ANGIOPLASTY WITH STENT PLACEMENT    . rigger finger      Her family history includes Breast cancer (age of onset: 87) in her mother; CAD in her father; Diabetes in her daughter and mother.     Previous Medications   ALBUTEROL (PROVENTIL HFA;VENTOLIN HFA) 108 (90 BASE) MCG/ACT INHALER    Inhale 2 puffs into the lungs every 6 (six) hours as needed for wheezing or shortness of breath.   AMLODIPINE (NORVASC) 5  MG TABLET    Take 1 tablet (5 mg total) by mouth daily.   ATORVASTATIN (LIPITOR) 40 MG TABLET    Take 1 tablet (40 mg total) by mouth daily.   BLOOD GLUCOSE MONITORING SUPPL (ACCU-CHEK AVIVA PLUS) W/DEVICE KIT    use to TEST BLOOD SUGAR   CHLORTHALIDONE (HYGROTON) 25 MG TABLET    Take 1 tablet (25 mg total) by mouth daily.   DULOXETINE (CYMBALTA) 60 MG CAPSULE    Take 1 capsule (60 mg total) by mouth daily.   GABAPENTIN (NEURONTIN) 300 MG CAPSULE    Take 1 capsule (300 mg total) by mouth at bedtime.   GLUCOSE BLOOD TEST STRIP       LANCETS (ACCU-CHEK MULTICLIX) LANCETS    TEST BLOOD SUGAR daily or as directed by prescriber   MELOXICAM (MOBIC) 15 MG TABLET    Take 1 tablet (15 mg total) by mouth daily.   METHOCARBAMOL (ROBAXIN) 750 MG TABLET    take 1 tablet by mouth twice a day if needed   METOPROLOL (LOPRESSOR) 50 MG TABLET    Take 1 tablet (50 mg total) by mouth 2 (two) times daily.   NEOMYCIN-POLYMYXIN B-DEXAMETHASONE (MAXITROL) 3.5-10000-0.1 SUSP    Place 2 drops into both eyes every 6 (six) hours.   NITROGLYCERIN (NITROSTAT) 0.4 MG SL TABLET    Place under the tongue.   POTASSIUM CHLORIDE SA (K-DUR,KLOR-CON) 20 MEQ TABLET    Take 1 tablet (20 mEq total) by mouth daily.   TRAZODONE (DESYREL) 50 MG TABLET  Take 1 tablet (50 mg total) by mouth at bedtime.    Patient Care Team: Glean Hess, MD as PCP - General (Family Medicine) Isaias Cowman, MD as Consulting Physician (Cardiology) Emmaline Kluver., MD (Rheumatology) Vladimir Crofts, MD (Neurology)      Objective:   Vitals: BP 128/68 (BP Location: Right Arm, Patient Position: Sitting, Cuff Size: Normal)   Pulse 64   Ht 5' 1"  (1.549 m)   Wt 133 lb (60.3 kg)   SpO2 99%   BMI 25.13 kg/m   Physical Exam  Constitutional: She is oriented to person, place, and time. She appears well-developed and well-nourished. No distress.  HENT:  Head: Normocephalic and atraumatic.  Right Ear: Tympanic membrane and ear canal  normal.  Left Ear: Tympanic membrane and ear canal normal.  Nose: Right sinus exhibits no maxillary sinus tenderness. Left sinus exhibits no maxillary sinus tenderness.  Mouth/Throat: Uvula is midline and oropharynx is clear and moist.  Eyes: Conjunctivae and EOM are normal. Right eye exhibits no discharge. Left eye exhibits no discharge. No scleral icterus.  Neck: Normal range of motion. Carotid bruit is not present. No erythema present. No thyromegaly present.  Cardiovascular: Normal rate, regular rhythm, normal heart sounds and normal pulses.   Pulmonary/Chest: Effort normal. No respiratory distress. She has no wheezes. Right breast exhibits no mass, no nipple discharge, no skin change and no tenderness. Left breast exhibits no mass, no nipple discharge, no skin change and no tenderness.  Abdominal: Soft. Bowel sounds are normal. There is generalized tenderness. There is guarding and CVA tenderness. There is no rigidity.  Very tender throughout - unable to adequately evaluate  Musculoskeletal: Normal range of motion.  Lymphadenopathy:    She has no cervical adenopathy.    She has no axillary adenopathy.  Neurological: She is alert and oriented to person, place, and time. She has normal reflexes. No cranial nerve deficit or sensory deficit.  Skin: Skin is warm, dry and intact. No rash noted.  Psychiatric: She has a normal mood and affect. Her speech is normal and behavior is normal. Thought content normal. Cognition and memory are normal.  Nursing note and vitals reviewed.   Activities of Daily Living In your present state of health, do you have any difficulty performing the following activities: 04/28/2017  Hearing? N  Vision? N  Difficulty concentrating or making decisions? N  Walking or climbing stairs? N  Dressing or bathing? N  Doing errands, shopping? N  Preparing Food and eating ? N  Using the Toilet? N  In the past six months, have you accidently leaked urine? N  Do you have  problems with loss of bowel control? N  Managing your Medications? N  Managing your Finances? N  Housekeeping or managing your Housekeeping? N  Some recent data might be hidden    Fall Risk Assessment Fall Risk  04/28/2017 04/10/2016  Falls in the past year? No No     Depression Screen PHQ 2/9 Scores 04/28/2017 04/10/2016  PHQ - 2 Score 0 0   6CIT Screen 04/28/2017  What Year? 0 points  What month? 0 points  What time? 0 points  Count back from 20 0 points  Months in reverse 0 points  Repeat phrase 0 points  Total Score 0   Urine dipstick shows negative for all components.  Micro exam: not done.  Medicare Annual Wellness Visit Summary:  Reviewed patient's Family Medical History Reviewed and updated list of patient's medical providers Assessment of  cognitive impairment was done Assessed patient's functional ability Established a written schedule for health screening [place] Completed and Reviewed  Exercise Activities and Dietary recommendations Goals    None      Immunization History  Administered Date(s) Administered  . PPD Test 06/19/2015  . Tdap 04/11/2016    Health Maintenance  Topic Date Due  . HIV Screening  09/19/1966  . DEXA SCAN  09/19/2016  . PNA vac Low Risk Adult (1 of 2 - PCV13) 09/19/2016  . COLONOSCOPY  05/09/2017  . INFLUENZA VACCINE  06/25/2017  . MAMMOGRAM  06/24/2018  . TETANUS/TDAP  04/11/2026  . Hepatitis C Screening  Completed    Discussed health benefits of physical activity, and encouraged her to engage in regular exercise appropriate for her age and condition.    ------------------------------------------------------------------------------------------------------------  Assessment & Plan:  1. Medicare annual wellness visit, subsequent Measures satisfied Patient states her pharmacy gave her a pneumonia vaccine this past winter  2. Encounter for special screening examination for neoplasm of breast Schedule at  [place]; Future  3. CAD S/P percutaneous coronary angioplasty Stable without anginal sx at this time Recommend ASA 81 mg daily - CBC with Differential/Platelet  4. Essential hypertension controlled - Comprehensive metabolic panel - TSH  5. Glucose intolerance (impaired glucose tolerance) May have worsening glucoses since recent vaginitis episodes - Hemoglobin A1c  6. Neuropathy Stable on gabapentin  7. Mixed hyperlipidemia Continue medication - Lipid panel  8. Pain with urination UA negative - will refer to Urology - POCT urinalysis dipstick - Ambulatory referral to Urology  9. Generalized abdominal pain Difficult to examine - needs GI and repeat colonoscopy - Ambulatory referral to Gastroenterology  10. Major depressive disorder with single episode, in partial remission ([place]) Continue cymbalta  No orders of the defined types were placed in this encounter.   Halina Maidens, MD [facility]  04/28/2017

## 2017-04-29 LAB — CBC WITH DIFFERENTIAL/PLATELET
Basophils Absolute: 0 10*3/uL (ref 0.0–0.2)
Basos: 0 %
EOS (ABSOLUTE): 0.1 10*3/uL (ref 0.0–0.4)
Eos: 1 %
HEMOGLOBIN: 13.4 g/dL (ref 11.1–15.9)
Hematocrit: 39.6 % (ref 34.0–46.6)
IMMATURE GRANS (ABS): 0 10*3/uL (ref 0.0–0.1)
Immature Granulocytes: 0 %
LYMPHS: 39 %
Lymphocytes Absolute: 2.9 10*3/uL (ref 0.7–3.1)
MCH: 30.7 pg (ref 26.6–33.0)
MCHC: 33.8 g/dL (ref 31.5–35.7)
MCV: 91 fL (ref 79–97)
Monocytes Absolute: 0.4 10*3/uL (ref 0.1–0.9)
Monocytes: 5 %
NEUTROS ABS: 4.1 10*3/uL (ref 1.4–7.0)
Neutrophils: 55 %
PLATELETS: 251 10*3/uL (ref 150–379)
RBC: 4.36 x10E6/uL (ref 3.77–5.28)
RDW: 13.7 % (ref 12.3–15.4)
WBC: 7.5 10*3/uL (ref 3.4–10.8)

## 2017-04-29 LAB — COMPREHENSIVE METABOLIC PANEL
A/G RATIO: 1.7 (ref 1.2–2.2)
ALBUMIN: 4.7 g/dL (ref 3.6–4.8)
ALT: 18 IU/L (ref 0–32)
AST: 24 IU/L (ref 0–40)
Alkaline Phosphatase: 72 IU/L (ref 39–117)
BILIRUBIN TOTAL: 0.3 mg/dL (ref 0.0–1.2)
BUN / CREAT RATIO: 15 (ref 12–28)
BUN: 19 mg/dL (ref 8–27)
CHLORIDE: 101 mmol/L (ref 96–106)
CO2: 25 mmol/L (ref 18–29)
Calcium: 9.6 mg/dL (ref 8.7–10.3)
Creatinine, Ser: 1.24 mg/dL — ABNORMAL HIGH (ref 0.57–1.00)
GFR calc non Af Amer: 46 mL/min/{1.73_m2} — ABNORMAL LOW (ref >59)
GFR, EST AFRICAN AMERICAN: 53 mL/min/{1.73_m2} — AB (ref >59)
GLOBULIN, TOTAL: 2.7 g/dL (ref 1.5–4.5)
Glucose: 97 mg/dL (ref 65–99)
POTASSIUM: 4 mmol/L (ref 3.5–5.2)
SODIUM: 141 mmol/L (ref 134–144)
TOTAL PROTEIN: 7.4 g/dL (ref 6.0–8.5)

## 2017-04-29 LAB — HEMOGLOBIN A1C
Est. average glucose Bld gHb Est-mCnc: 120 mg/dL
HEMOGLOBIN A1C: 5.8 % — AB (ref 4.8–5.6)

## 2017-04-29 LAB — LIPID PANEL
CHOL/HDL RATIO: 2.9 ratio (ref 0.0–4.4)
Cholesterol, Total: 165 mg/dL (ref 100–199)
HDL: 56 mg/dL (ref >39)
LDL Calculated: 94 mg/dL (ref 0–99)
Triglycerides: 74 mg/dL (ref 0–149)
VLDL Cholesterol Cal: 15 mg/dL (ref 5–40)

## 2017-04-29 LAB — TSH: TSH: 2.3 u[IU]/mL (ref 0.450–4.500)

## 2017-05-19 NOTE — Progress Notes (Signed)
05/21/2017 2:15 PM   Betty Vaughn July 30, 1951 [id number]  Referring provider: Glean Hess, MD [address] [place], [place] [address]  No chief complaint on file.   HPI: Patient is a 66 -year-old [place] American female who is referred to [place] by, Dr. Halina Maidens, for pain with urination.    She states that she has been having issues with urgency and feelings of incomplete emptying for over the four years.  It has been getting worse over the last few months.  She is having to strain to get the urine out and feels a pulling sensation in her vagina.    She has nocturia x 3 -4, hesitancy and a weak urinary stream.    Patient states that she has not had urinary tract infections over the last year.   She denies dysuria, gross hematuria, suprapubic pain, back pain, abdominal pain or flank pain.  She has not had any recent fevers, chills or vomiting.  She has nausea that she attributes to her GERD.    She does not have a history of nephrolithiasis, GU surgery or GU trauma.  She is not sexually active.  She is postmenopausal.    She admits to constipation.  She does not have incontinence.   She is not having pain with bladder filling.    She has not had any recent imaging studies.   She is drinking 4 bottles of water daily.   3 to 4 cups of coffee daily.  No sodas.  She has a glass of wine nightly.    Her UA was unremarkable.  Her PVR was 0 mL.    PMH: Past Medical History:  Diagnosis Date  . Allergy   . Chronic insomnia   . Colon cancer ([place])    polyp was cancerous  . Diabetes mellitus without complication ([place])   . Hx of colonic polyps   . Hyperlipidemia   . Hypertension   . Neuromuscular disorder ([place])   . Psoriasis    scalp    Surgical History: Past Surgical History:  Procedure Laterality Date  . ABDOMINAL HYSTERECTOMY     total  . CERVICAL FUSION  2005  . CHOLECYSTECTOMY    . CORONARY ANGIOPLASTY WITH STENT PLACEMENT    . HAND SURGERY  Bilateral 2001  . POLYPECTOMY  2000  . rigger finger      Home Medications:  Allergies as of 05/21/2017      Reactions   Azithromycin Swelling   Propoxyphene Anaphylaxis   Tramadol Anaphylaxis   Penicillins Swelling   Piroxicam Rash   Sertraline Hcl Rash      Medication List       Accurate as of 05/21/17  2:15 PM. Always use your most recent med list.          ACCU-CHEK AVIVA PLUS w/Device Kit use to TEST BLOOD SUGAR   accu-chek multiclix lancets TEST BLOOD SUGAR daily or as directed by prescriber   albuterol 108 (90 Base) MCG/ACT inhaler Commonly known as:  PROVENTIL HFA;VENTOLIN HFA Inhale 2 puffs into the lungs every 6 (six) hours as needed for wheezing or shortness of breath.   amLODipine 5 MG tablet Commonly known as:  NORVASC Take 1 tablet (5 mg total) by mouth daily.   atorvastatin 40 MG tablet Commonly known as:  LIPITOR Take 1 tablet (40 mg total) by mouth daily.   chlorthalidone 25 MG tablet Commonly known as:  HYGROTON Take 1 tablet (25 mg total) by mouth daily.  DULoxetine 60 MG capsule Commonly known as:  CYMBALTA Take 1 capsule (60 mg total) by mouth daily.   gabapentin 300 MG capsule Commonly known as:  NEURONTIN Take 1 capsule (300 mg total) by mouth at bedtime.   glucose blood test strip   meloxicam 15 MG tablet Commonly known as:  MOBIC Take 1 tablet (15 mg total) by mouth daily.   methocarbamol 750 MG tablet Commonly known as:  ROBAXIN take 1 tablet by mouth twice a day if needed   metoprolol tartrate 50 MG tablet Commonly known as:  LOPRESSOR Take 1 tablet (50 mg total) by mouth 2 (two) times daily.   neomycin-polymyxin b-dexamethasone 3.5-10000-0.1 Susp Commonly known as:  MAXITROL Place 2 drops into both eyes every 6 (six) hours.   nitroGLYCERIN 0.4 MG SL tablet Commonly known as:  NITROSTAT Place under the tongue.   potassium chloride SA 20 MEQ tablet Commonly known as:  K-DUR,KLOR-CON Take 1 tablet (20 mEq total) by  mouth daily.   traZODone 50 MG tablet Commonly known as:  DESYREL Take 1 tablet (50 mg total) by mouth at bedtime.       Allergies:  Allergies  Allergen Reactions  . Azithromycin Swelling  . Propoxyphene Anaphylaxis  . Tramadol Anaphylaxis  . Penicillins Swelling  . Piroxicam Rash  . Sertraline Hcl Rash    Family History: Family History  Problem Relation Age of Onset  . Breast cancer Mother 70  . Diabetes Mother   . CAD Father   . Heart attack Father   . Diabetes Daughter   . Kidney failure Daughter   . Heart attack Sister   . Sickle cell anemia Other     Social History:  reports that she has been smoking Cigarettes.  She has a 15.00 pack-year smoking history. She uses smokeless tobacco. She reports that she drinks about 2.4 oz of alcohol per week . She reports that she does not use drugs.  ROS: UROLOGY Frequent Urination?: No Hard to postpone urination?: No Burning/pain with urination?: Yes Get up at night to urinate?: Yes Leakage of urine?: No Urine stream starts and stops?: No Trouble starting stream?: Yes Do you have to strain to urinate?: Yes Blood in urine?: No Urinary tract infection?: No Sexually transmitted disease?: No Injury to kidneys or bladder?: No Painful intercourse?: No Weak stream?: No Currently pregnant?: No Vaginal bleeding?: Yes Last menstrual period?: no  Gastrointestinal Nausea?: Yes Vomiting?: No Indigestion/heartburn?: Yes Diarrhea?: No Constipation?: Yes  Constitutional Fever: No Night sweats?: Yes Weight loss?: Yes Fatigue?: Yes  Skin Skin rash/lesions?: Yes Itching?: Yes  Eyes Blurred vision?: Yes Double vision?: No  Ears/Nose/Throat Sore throat?: Yes Sinus problems?: Yes  Hematologic/Lymphatic Swollen glands?: No Easy bruising?: Yes  Cardiovascular Leg swelling?: Yes Chest pain?: Yes  Respiratory Cough?: Yes Shortness of breath?: Yes  Endocrine Excessive thirst?: Yes  Musculoskeletal Back  pain?: Yes Joint pain?: Yes  Neurological Headaches?: Yes Dizziness?: Yes  Psychologic Depression?: Yes Anxiety?: Yes  Physical Exam: BP (!) 150/90 (BP Location: Left Arm, Patient Position: Sitting, Cuff Size: Normal)   Pulse 64   Ht 5' 1"  (1.549 m)   Wt 133 lb 8 oz (60.6 kg)   BMI 25.22 kg/m   Constitutional: Well nourished. Alert and oriented, No acute distress. HEENT: [place] AT, moist mucus membranes. Trachea midline, no masses. Cardiovascular: No clubbing, cyanosis, or edema. Respiratory: Normal respiratory effort, no increased work of breathing. GI: Abdomen is soft, non tender, non distended, no abdominal masses. Liver and spleen not  palpable.  No hernias appreciated.  Stool sample for occult testing is not indicated.   GU: No CVA tenderness.  No bladder fullness or masses.   Atrophic external genitalia, normal pubic hair distribution, no lesions.  Normal urethral meatus, no lesions, no prolapse, no discharge.   No urethral masses and/or tenderness.  Urethral tenderness noted.  No bladder fullness or masses. Bladder is tender.  Pale vagina mucosa, poor estrogen effect, no discharge, no lesions, good pelvic support, Grade I cystocele.  No  rectocele noted.  Vaginal canal and rectum are tender.  Cervix and uterus are surgically absent.  No adnexal/parametria masses or tenderness noted.  Anus and perineum are without rashes or lesions.    Skin: No rashes, bruises or suspicious lesions. Lymph: No cervical or inguinal adenopathy. Neurologic: Grossly intact, no focal deficits, moving all 4 extremities. Psychiatric: Normal mood and affect.  Laboratory Data: Lab Results  Component Value Date   WBC 7.5 04/28/2017   HGB 13.4 04/28/2017   HCT 39.6 04/28/2017   MCV 91 04/28/2017   PLT 251 04/28/2017    Lab Results  Component Value Date   CREATININE 1.24 (H) 04/28/2017     Lab Results  Component Value Date   HGBA1C 5.8 (H) 04/28/2017    Lab Results  Component Value Date    TSH 2.300 04/28/2017       Component Value Date/Time   CHOL 165 04/28/2017 1153   HDL 56 04/28/2017 1153   CHOLHDL 2.9 04/28/2017 1153   LDLCALC 94 04/28/2017 1153    Lab Results  Component Value Date   AST 24 04/28/2017   Lab Results  Component Value Date   ALT 18 04/28/2017    I have independently reviewed the labs.  Urinalysis Unremarkable.  See EPIC.    Pertinent Imaging: Results for BRANDEE, MARKIN (MRN [id number]) as of 05/21/2017 14:16  Ref. Range 05/21/2017 14:02  Scan Result Unknown 0     Assessment & Plan:    1. Urgency  - offered behavioral therapies, bladder training and bladder control strategies - patient deferred PT  - pelvic floor muscle training  - fluid management - good water intake  - offered medical therapy with anticholinergic therapy or beta-3 adrenergic receptor agonist and the potential side effects of each therapy   - would like to try the beta-3 adrenergic receptor agonist (Myrbetriq).  Given Myrbetriq 25 mg samples, #28.  I have reviewed with the patient of the side effects of Myrbetriq, such as: elevation in BP, urinary retention and/or HA.  She will return in one month for PVR and symptom recheck.    - RTC in 3 weeks for PVR and symptom recheck   2. Vaginal atrophy  - Patient was given a sample of vaginal estrogen cream (Premarin) and instructed to apply 0.22m (pea-sized amount)  just inside the vaginal introitus with a finger-tip every night for two weeks and then Monday, Wednesday and Friday nights.  I explained to the patient that vaginally administered estrogen, which causes only a slight increase in the blood estrogen levels, have fewer contraindications and adverse systemic effects that oral HT.  - She will follow up in three months for an exam.    3. Pelvic pain  - GI appointment pending  - will wait on their evaluation - if they do not pursue a CT scan, we will pursue the study     Return in about 3 weeks (around 06/11/2017) for  PVR and OAB questionnaire.  These notes generated  with voice recognition software. I apologize for typographical errors.  Zara Council, [facility] [address], [place], [place] [address] [phone]

## 2017-05-21 ENCOUNTER — Ambulatory Visit (INDEPENDENT_AMBULATORY_CARE_PROVIDER_SITE_OTHER): Payer: Medicare HMO | Admitting: Urology

## 2017-05-21 ENCOUNTER — Encounter: Payer: Self-pay | Admitting: Urology

## 2017-05-21 VITALS — BP 150/90 | HR 64 | Ht 61.0 in | Wt 133.5 lb

## 2017-05-21 DIAGNOSIS — R102 Pelvic and perineal pain: Secondary | ICD-10-CM

## 2017-05-21 DIAGNOSIS — R3915 Urgency of urination: Secondary | ICD-10-CM | POA: Diagnosis not present

## 2017-05-21 DIAGNOSIS — N952 Postmenopausal atrophic vaginitis: Secondary | ICD-10-CM | POA: Diagnosis not present

## 2017-05-21 DIAGNOSIS — R300 Dysuria: Secondary | ICD-10-CM | POA: Diagnosis not present

## 2017-05-21 LAB — BLADDER SCAN AMB NON-IMAGING: Scan Result: 0

## 2017-05-22 LAB — URINALYSIS, COMPLETE
Bilirubin, UA: NEGATIVE
GLUCOSE, UA: NEGATIVE
KETONES UA: NEGATIVE
Leukocytes, UA: NEGATIVE
Nitrite, UA: NEGATIVE
PROTEIN UA: NEGATIVE
SPEC GRAV UA: 1.01 (ref 1.005–1.030)
Urobilinogen, Ur: 0.2 mg/dL (ref 0.2–1.0)
pH, UA: 7 (ref 5.0–7.5)

## 2017-05-22 LAB — MICROSCOPIC EXAMINATION
Bacteria, UA: NONE SEEN
RBC, UA: NONE SEEN /hpf (ref 0–?)

## 2017-06-03 ENCOUNTER — Other Ambulatory Visit: Payer: Self-pay

## 2017-06-03 ENCOUNTER — Encounter: Payer: Self-pay | Admitting: Gastroenterology

## 2017-06-03 ENCOUNTER — Ambulatory Visit (INDEPENDENT_AMBULATORY_CARE_PROVIDER_SITE_OTHER): Payer: Medicare HMO | Admitting: Gastroenterology

## 2017-06-03 VITALS — BP 120/74 | HR 67 | Temp 97.3°F | Ht 61.0 in | Wt 134.4 lb

## 2017-06-03 DIAGNOSIS — R1032 Left lower quadrant pain: Secondary | ICD-10-CM | POA: Diagnosis not present

## 2017-06-03 NOTE — Progress Notes (Signed)
Gastroenterology Consultation  Referring Provider:     Glean Hess, MD Primary Care Physician:  Glean Hess, MD Primary Gastroenterologist:  Dr. Allen Norris     Reason for Consultation:     Abdominal pain        HPI:   Betty Vaughn is a 66 y.o. y/o female referred for consultation & management of Abdominal pain by Dr. [name], [name], MD.  This patient comes in today with a report of abdominal pain.  The patient reports of abdominal pain is not associated with her bowel movements.  The patient does have a history of colon polyps and a history of colitis.  The patient denies any rectal bleeding or diarrhea at the present time.  The patient states that her abdominal pain is worse when she urinates.  There is no report of any black stools or bloody stools.  He does report that she had a colonoscopy in the past but has been many years.  The patient also suffers from back pain.  Past Medical History:  Diagnosis Date  . Allergy   . Chronic insomnia   . Colon cancer ([place])    polyp was cancerous  . Diabetes mellitus without complication ([place])   . Hx of colonic polyps   . Hyperlipidemia   . Hypertension   . Neuromuscular disorder ([place])   . Psoriasis    scalp    Past Surgical History:  Procedure Laterality Date  . ABDOMINAL HYSTERECTOMY     total  . CERVICAL FUSION  2005  . CHOLECYSTECTOMY    . CORONARY ANGIOPLASTY WITH STENT PLACEMENT    . HAND SURGERY Bilateral 2001  . POLYPECTOMY  2000  . rigger finger      Prior to Admission medications   Medication Sig Start Date End Date Taking? Authorizing Provider  albuterol (PROVENTIL HFA;VENTOLIN HFA) 108 (90 Base) MCG/ACT inhaler Inhale 2 puffs into the lungs every 6 (six) hours as needed for wheezing or shortness of breath. 11/10/16  Yes Orbie Pyo, MD  amLODipine (NORVASC) 5 MG tablet Take 1 tablet (5 mg total) by mouth daily. 02/04/17  Yes Glean Hess, MD  atorvastatin (LIPITOR) 40 MG tablet Take 1  tablet (40 mg total) by mouth daily. 02/04/17  Yes Glean Hess, MD  Blood Glucose Monitoring Suppl (ACCU-CHEK AVIVA PLUS) W/DEVICE KIT use to TEST BLOOD SUGAR 06/21/15  Yes Glean Hess, MD  chlorthalidone (HYGROTON) 25 MG tablet Take 1 tablet (25 mg total) by mouth daily. 02/04/17  Yes Glean Hess, MD  DULoxetine (CYMBALTA) 60 MG capsule Take 1 capsule (60 mg total) by mouth daily. 02/04/17  Yes Glean Hess, MD  gabapentin (NEURONTIN) 300 MG capsule Take 1 capsule (300 mg total) by mouth at bedtime. 02/04/17  Yes Glean Hess, MD  glucose blood test strip  11/09/14  Yes [provider]  Lancets (ACCU-CHEK MULTICLIX) lancets TEST BLOOD SUGAR daily or as directed by prescriber 07/09/15  Yes Glean Hess, MD  meloxicam (MOBIC) 15 MG tablet Take 1 tablet (15 mg total) by mouth daily. 02/04/17  Yes Glean Hess, MD  methocarbamol (ROBAXIN) 750 MG tablet take 1 tablet by mouth twice a day if needed 01/23/17  Yes Glean Hess, MD  metoprolol (LOPRESSOR) 50 MG tablet Take 1 tablet (50 mg total) by mouth 2 (two) times daily. 02/04/17  Yes Glean Hess, MD  neomycin-polymyxin b-dexamethasone (MAXITROL) 3.5-10000-0.1 SUSP Place 2 drops into both eyes every 6 (  six) hours. 11/08/16  Yes Glean Hess, MD  nitroGLYCERIN (NITROSTAT) 0.4 MG SL tablet Place under the tongue.   Yes [provider]  potassium chloride SA (K-DUR,KLOR-CON) 20 MEQ tablet Take 1 tablet (20 mEq total) by mouth daily. 02/04/17  Yes Glean Hess, MD  traZODone (DESYREL) 50 MG tablet Take 1 tablet (50 mg total) by mouth at bedtime. 02/04/17  Yes Glean Hess, MD    Family History  Problem Relation Age of Onset  . Breast cancer Mother 54  . Diabetes Mother   . CAD Father   . Heart attack Father   . Diabetes Daughter   . Kidney failure Daughter   . Heart attack Sister   . Sickle cell anemia Other      Social History  Substance Use Topics  . Smoking status: Current  Every Day Smoker    Packs/day: 0.50    Years: 30.00    Types: Cigarettes  . Smokeless tobacco: Never Used  . Alcohol use 2.4 oz/week    4 Standard drinks or equivalent per week    Allergies as of 06/03/2017 - Review Complete 06/03/2017  Allergen Reaction Noted  . Azithromycin Swelling 11/10/2016  . Propoxyphene Anaphylaxis 05/13/2015  . Tramadol Anaphylaxis 05/13/2015  . Penicillins Swelling 05/13/2015  . Piroxicam Rash 05/13/2015  . Sertraline hcl Rash 05/13/2015    Review of Systems:    All systems reviewed and negative except where noted in HPI.   Physical Exam:  BP 120/74   Pulse 67   Temp (!) 97.3 F (36.3 C) (Oral)   Ht _0  (1.549 m)   Wt 134 lb 6.4 oz (61 kg)   BMI 25.39 kg/m  No LMP recorded. Patient has had a hysterectomy. Psych:  Alert and cooperative. Normal mood and affect. General:   Alert,  Well-developed, well-nourished, pleasant and cooperative in NAD Head:  Normocephalic and atraumatic. Eyes:  Sclera clear, no icterus.   Conjunctiva pink. Ears:  Normal auditory acuity. Nose:  No deformity, discharge, or lesions. Mouth:  No deformity or lesions,oropharynx pink & moist. Neck:  Supple; no masses or thyromegaly. Lungs:  Respirations even and unlabored.  Clear throughout to auscultation.   No wheezes, crackles, or rhonchi. No acute distress. Heart:  Regular rate and rhythm; no murmurs, clicks, rubs, or gallops. Abdomen:  Normal bowel sounds.  No bruits.  Soft, Positive tenderness to 1 finger palpation while flexing the abdominal wall muscles. The patient experiences discomfort with just raising the legs without palpating her abdominal wall. Non-distended without masses, hepatosplenomegaly or hernias noted.  No guarding or rebound tenderness.  Negative Carnett sign.   Rectal:  Deferred.  Msk:  Symmetrical without gross deformities.  Good, equal movement & strength bilaterally. Pulses:  Normal pulses noted. Extremities:  No clubbing or edema.  No  cyanosis. Neurologic:  Alert and oriented x3;  grossly normal neurologically. Skin:  Intact without significant lesions or rashes.  No jaundice. Lymph Nodes:  No significant cervical adenopathy. Psych:  Alert and cooperative. Normal mood and affect.  Imaging Studies: No results found.  Assessment and Plan:   Betty Vaughn is a 66 y.o. y/o female who comes in today with lower abdominal pain in the left side that she reports to be worse when she is urinating.  The patient was seen by urology and she states she was given some medication that makes her feel better area the patient on physical exam has findings consistent with significant muscular or skeletal wall pain.  The patient has been explained this and will be set up for a colonoscopy due to her history of polyps and colitis. I have discussed risks & benefits which include, but are not limited to, bleeding, infection, perforation & drug reaction.  The patient agrees with this plan & written consent will be obtained.     Lucilla Lame, MD. Marval Regal   Note: This dictation was prepared with Dragon dictation along with smaller phrase technology. Any transcriptional errors that result from this process are unintentional.

## 2017-06-05 ENCOUNTER — Other Ambulatory Visit: Payer: Self-pay

## 2017-06-05 DIAGNOSIS — Z86010 Personal history of colonic polyps: Secondary | ICD-10-CM

## 2017-06-11 NOTE — Discharge Instructions (Signed)
General Anesthesia, Adult, Care After °These instructions provide you with information about caring for yourself after your procedure. Your health care provider may also give you more specific instructions. Your treatment has been planned according to current medical practices, but problems sometimes occur. Call your health care provider if you have any problems or questions after your procedure. °What can I expect after the procedure? °After the procedure, it is common to have: °· Vomiting. °· A sore throat. °· Mental slowness. ° °It is common to feel: °· Nauseous. °· Cold or shivery. °· Sleepy. °· Tired. °· Sore or achy, even in parts of your body where you did not have surgery. ° °Follow these instructions at home: °For at least 24 hours after the procedure: °· Do not: °? Participate in activities where you could fall or become injured. °? Drive. °? Use heavy machinery. °? Drink alcohol. °? Take sleeping pills or medicines that cause drowsiness. °? Make important decisions or sign legal documents. °? Take care of children on your own. °· Rest. °Eating and drinking °· If you vomit, drink water, juice, or soup when you can drink without vomiting. °· Drink enough fluid to keep your urine clear or pale yellow. °· Make sure you have little or no nausea before eating solid foods. °· Follow the diet recommended by your health care provider. °General instructions °· Have a responsible adult stay with you until you are awake and alert. °· Return to your normal activities as told by your health care provider. Ask your health care provider what activities are safe for you. °· Take over-the-counter and prescription medicines only as told by your health care provider. °· If you smoke, do not smoke without supervision. °· Keep all follow-up visits as told by your health care provider. This is important. °Contact a health care provider if: °· You continue to have nausea or vomiting at home, and medicines are not helpful. °· You  cannot drink fluids or start eating again. °· You cannot urinate after 8-12 hours. °· You develop a skin rash. °· You have fever. °· You have increasing redness at the site of your procedure. °Get help right away if: °· You have difficulty breathing. °· You have chest pain. °· You have unexpected bleeding. °· You feel that you are having a life-threatening or urgent problem. °This information is not intended to replace advice given to you by your health care provider. Make sure you discuss any questions you have with your health care provider. °Document Released: 02/17/2001 Document Revised: 04/15/2016 Document Reviewed: 10/26/2015 °Elsevier Interactive Patient Education © 2018 [employer] Inc[employer] ° °

## 2017-06-12 ENCOUNTER — Ambulatory Visit: Payer: Medicare HMO | Admitting: Urology

## 2017-06-12 ENCOUNTER — Ambulatory Visit
Admission: RE | Admit: 2017-06-12 | Discharge: 2017-06-12 | Disposition: A | Discharge status: Home / Self Care | Payer: Medicare HMO | Source: Ambulatory Visit | Attending: Gastroenterology | Admitting: Gastroenterology

## 2017-06-12 ENCOUNTER — Ambulatory Visit: Payer: Medicare HMO | Admitting: Anesthesiology

## 2017-06-12 ENCOUNTER — Encounter
Admission: RE | Disposition: A | Discharge status: Home / Self Care | Payer: Self-pay | Source: Ambulatory Visit | Attending: Gastroenterology

## 2017-06-12 DIAGNOSIS — Z880 Allergy status to penicillin: Secondary | ICD-10-CM | POA: Diagnosis not present

## 2017-06-12 DIAGNOSIS — Z888 Allergy status to other drugs, medicaments and biological substances status: Secondary | ICD-10-CM | POA: Insufficient documentation

## 2017-06-12 DIAGNOSIS — F17210 Nicotine dependence, cigarettes, uncomplicated: Secondary | ICD-10-CM | POA: Insufficient documentation

## 2017-06-12 DIAGNOSIS — Z881 Allergy status to other antibiotic agents status: Secondary | ICD-10-CM | POA: Diagnosis not present

## 2017-06-12 DIAGNOSIS — Z860100 Personal history of colon polyps, unspecified: Secondary | ICD-10-CM

## 2017-06-12 DIAGNOSIS — Z1211 Encounter for screening for malignant neoplasm of colon: Secondary | ICD-10-CM | POA: Diagnosis not present

## 2017-06-12 DIAGNOSIS — Z885 Allergy status to narcotic agent status: Secondary | ICD-10-CM | POA: Insufficient documentation

## 2017-06-12 DIAGNOSIS — E119 Type 2 diabetes mellitus without complications: Secondary | ICD-10-CM | POA: Diagnosis not present

## 2017-06-12 DIAGNOSIS — Z85038 Personal history of other malignant neoplasm of large intestine: Secondary | ICD-10-CM | POA: Diagnosis not present

## 2017-06-12 DIAGNOSIS — Z79899 Other long term (current) drug therapy: Secondary | ICD-10-CM | POA: Insufficient documentation

## 2017-06-12 DIAGNOSIS — K219 Gastro-esophageal reflux disease without esophagitis: Secondary | ICD-10-CM | POA: Insufficient documentation

## 2017-06-12 DIAGNOSIS — Z8249 Family history of ischemic heart disease and other diseases of the circulatory system: Secondary | ICD-10-CM | POA: Diagnosis not present

## 2017-06-12 DIAGNOSIS — G4700 Insomnia, unspecified: Secondary | ICD-10-CM | POA: Diagnosis not present

## 2017-06-12 DIAGNOSIS — M1990 Unspecified osteoarthritis, unspecified site: Secondary | ICD-10-CM | POA: Insufficient documentation

## 2017-06-12 DIAGNOSIS — L409 Psoriasis, unspecified: Secondary | ICD-10-CM | POA: Diagnosis not present

## 2017-06-12 DIAGNOSIS — K621 Rectal polyp: Secondary | ICD-10-CM

## 2017-06-12 DIAGNOSIS — I10 Essential (primary) hypertension: Secondary | ICD-10-CM | POA: Diagnosis not present

## 2017-06-12 DIAGNOSIS — K641 Second degree hemorrhoids: Secondary | ICD-10-CM | POA: Diagnosis not present

## 2017-06-12 DIAGNOSIS — D128 Benign neoplasm of rectum: Secondary | ICD-10-CM | POA: Diagnosis not present

## 2017-06-12 DIAGNOSIS — E785 Hyperlipidemia, unspecified: Secondary | ICD-10-CM | POA: Diagnosis not present

## 2017-06-12 HISTORY — DX: Unspecified osteoarthritis, unspecified site: M19.90

## 2017-06-12 HISTORY — PX: POLYPECTOMY: SHX5525

## 2017-06-12 HISTORY — PX: COLONOSCOPY WITH PROPOFOL: SHX5780

## 2017-06-12 HISTORY — DX: Gastro-esophageal reflux disease without esophagitis: K21.9

## 2017-06-12 SURGERY — COLONOSCOPY WITH PROPOFOL
Anesthesia: General

## 2017-06-12 MED ORDER — ACETAMINOPHEN 325 MG PO TABS: 325.0000 mg | ORAL_TABLET | ORAL | Status: DC | PRN

## 2017-06-12 MED ORDER — STERILE WATER FOR IRRIGATION IR SOLN
Status: DC | PRN
  Administered 2017-06-12: 11:00:00

## 2017-06-12 MED ORDER — LACTATED RINGERS IV SOLN
INTRAVENOUS | Status: DC
  Administered 2017-06-12: 10:00:00 via INTRAVENOUS

## 2017-06-12 MED ORDER — LIDOCAINE HCL (CARDIAC) 20 MG/ML IV SOLN
INTRAVENOUS | Status: DC | PRN
  Administered 2017-06-12: 50 mg via INTRAVENOUS

## 2017-06-12 MED ORDER — ACETAMINOPHEN 160 MG/5ML PO SOLN: 325.0000 mg | ORAL | Status: DC | PRN

## 2017-06-12 MED ORDER — PROPOFOL 10 MG/ML IV BOLUS
INTRAVENOUS | Status: DC | PRN
  Administered 2017-06-12 (×2): 20 mg via INTRAVENOUS
  Administered 2017-06-12: 50 mg via INTRAVENOUS
  Administered 2017-06-12: 100 mg via INTRAVENOUS

## 2017-06-12 SURGICAL SUPPLY — 23 items
CANISTER SUCT 1200ML W/VALVE (MISCELLANEOUS) ×3 IMPLANT
CLIP HMST 235XBRD CATH ROT (MISCELLANEOUS) IMPLANT
CLIP RESOLUTION 360 11X235 (MISCELLANEOUS)
FCP ESCP3.2XJMB 240X2.8X (MISCELLANEOUS)
FORCEPS BIOP RAD 4 LRG CAP 4 (CUTTING FORCEPS) ×3 IMPLANT
FORCEPS BIOP RJ4 240 W/NDL (MISCELLANEOUS)
FORCEPS ESCP3.2XJMB 240X2.8X (MISCELLANEOUS) IMPLANT
GOWN CVR UNV OPN BCK APRN NK (MISCELLANEOUS) ×2 IMPLANT
GOWN ISOL THUMB LOOP REG UNIV (MISCELLANEOUS) ×4
INJECTOR VARIJECT VIN23 (MISCELLANEOUS) IMPLANT
KIT DEFENDO VALVE AND CONN (KITS) IMPLANT
KIT ENDO PROCEDURE OLY (KITS) ×3 IMPLANT
MARKER SPOT ENDO TATTOO 5ML (MISCELLANEOUS) IMPLANT
PAD GROUND ADULT SPLIT (MISCELLANEOUS) IMPLANT
PROBE APC STR FIRE (PROBE) IMPLANT
RETRIEVER NET ROTH 2.5X230 LF (MISCELLANEOUS) IMPLANT
SNARE SHORT THROW 13M SML OVAL (MISCELLANEOUS) IMPLANT
SNARE SHORT THROW 30M LRG OVAL (MISCELLANEOUS) IMPLANT
SNARE SNG USE RND 15MM (INSTRUMENTS) IMPLANT
SPOT EX ENDOSCOPIC TATTOO (MISCELLANEOUS)
TRAP ETRAP POLY (MISCELLANEOUS) ×3 IMPLANT
VARIJECT INJECTOR VIN23 (MISCELLANEOUS)
WATER STERILE IRR 250ML POUR (IV SOLUTION) ×3 IMPLANT

## 2017-06-12 NOTE — H&P (Signed)
Betty Lame, MD [facility] [address], [place], [place] [address] Phone:[phone] Fax : [phone]  Primary Care Physician:  Glean Hess, MD Primary Gastroenterologist:  Dr. Allen Norris  Pre-Procedure History & Physical: HPI:  Betty Vaughn is a 66 y.o. female is here for an colonoscopy.   Past Medical History:  Diagnosis Date  . Allergy   . Arthritis   . Chronic insomnia   . Colon cancer ([place])    polyp was cancerous  . Diabetes mellitus without complication ([place])   . GERD (gastroesophageal reflux disease)   . Hx of colonic polyps   . Hyperlipidemia   . Hypertension   . Neuromuscular disorder ([place])   . Psoriasis    scalp    Past Surgical History:  Procedure Laterality Date  . ABDOMINAL HYSTERECTOMY     total  . CERVICAL FUSION  2005  . CHOLECYSTECTOMY    . 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"  . HAND SURGERY Bilateral 2001  . POLYPECTOMY  2000  . rigger finger      Prior to Admission medications   Medication Sig Start Date End Date Taking? Authorizing Provider  albuterol (PROVENTIL HFA;VENTOLIN HFA) 108 (90 Base) MCG/ACT inhaler Inhale 2 puffs into the lungs every 6 (six) hours as needed for wheezing or shortness of breath. 11/10/16  Yes Orbie Pyo, MD  amLODipine (NORVASC) 5 MG tablet Take 1 tablet (5 mg total) by mouth daily. 02/04/17  Yes Glean Hess, MD  atorvastatin (LIPITOR) 40 MG tablet Take 1 tablet (40 mg total) by mouth daily. 02/04/17  Yes Glean Hess, MD  Blood Glucose Monitoring Suppl (ACCU-CHEK AVIVA PLUS) W/DEVICE KIT use to TEST BLOOD SUGAR 06/21/15  Yes Glean Hess, MD  chlorthalidone (HYGROTON) 25 MG tablet Take 1 tablet (25 mg total) by mouth daily. 02/04/17  Yes Glean Hess, MD  DULoxetine (CYMBALTA) 60 MG capsule Take 1 capsule (60 mg total) by mouth daily. 02/04/17  Yes Glean Hess, MD  gabapentin (NEURONTIN) 300 MG capsule Take 1 capsule (300 mg total) by mouth at bedtime. 02/04/17   Yes Glean Hess, MD  glucose blood test strip  11/09/14  Yes [provider]  Lancets (ACCU-CHEK MULTICLIX) lancets TEST BLOOD SUGAR daily or as directed by prescriber 07/09/15  Yes Glean Hess, MD  meloxicam (MOBIC) 15 MG tablet Take 1 tablet (15 mg total) by mouth daily. 02/04/17  Yes Glean Hess, MD  methocarbamol (ROBAXIN) 750 MG tablet take 1 tablet by mouth twice a day if needed 01/23/17  Yes Glean Hess, MD  metoprolol (LOPRESSOR) 50 MG tablet Take 1 tablet (50 mg total) by mouth 2 (two) times daily. 02/04/17  Yes Glean Hess, MD  nitroGLYCERIN (NITROSTAT) 0.4 MG SL tablet Place under the tongue.   Yes [provider]  potassium chloride SA (K-DUR,KLOR-CON) 20 MEQ tablet Take 1 tablet (20 mEq total) by mouth daily. 02/04/17  Yes Glean Hess, MD  traZODone (DESYREL) 50 MG tablet Take 1 tablet (50 mg total) by mouth at bedtime. 02/04/17  Yes Glean Hess, MD  neomycin-polymyxin b-dexamethasone (MAXITROL) 3.5-10000-0.1 SUSP Place 2 drops into both eyes every 6 (six) hours. Patient not taking: Reported on 06/06/2017 11/08/16   Glean Hess, MD    Allergies as of 06/05/2017 - Review Complete 06/03/2017  Allergen Reaction Noted  . Azithromycin Swelling 11/10/2016  . Propoxyphene Anaphylaxis 05/13/2015  . Tramadol Anaphylaxis 05/13/2015  . Penicillins Swelling 05/13/2015  .  Piroxicam Rash 05/13/2015  . Sertraline hcl Rash 05/13/2015    Family History  Problem Relation Age of Onset  . Breast cancer Mother 37  . Diabetes Mother   . CAD Father   . Heart attack Father   . Diabetes Daughter   . Kidney failure Daughter   . Heart attack Sister   . Sickle cell anemia Other     Social History   Social History  . Marital status: Married    Spouse name: N/A  . Number of children: N/A  . Years of education: N/A   Occupational History  . Not on file.   Social History Main Topics  . Smoking status: Current Every Day Smoker     Packs/day: 0.50    Years: 30.00    Types: Cigarettes  . Smokeless tobacco: Never Used  . Alcohol use 2.4 oz/week    4 Standard drinks or equivalent per week  . Drug use: No  . Sexual activity: Not on file   Other Topics Concern  . Not on file   Social History Narrative  . No narrative on file    Review of Systems: See HPI, otherwise negative ROS  Physical Exam: Ht 5' 1"  (1.549 m)   Wt 133 lb (60.3 kg)   BMI 25.13 kg/m  General:   Alert,  pleasant and cooperative in NAD Head:  Normocephalic and atraumatic. Neck:  Supple; no masses or thyromegaly. Lungs:  Clear throughout to auscultation.    Heart:  Regular rate and rhythm. Abdomen:  Soft, nontender and nondistended. Normal bowel sounds, without guarding, and without rebound.   Neurologic:  Alert and  oriented x4;  grossly normal neurologically.  Impression/Plan: Betty Vaughn is here for an colonoscopy to be performed for history of colon polyps  Risks, benefits, limitations, and alternatives regarding  colonoscopy have been reviewed with the patient.  Questions have been answered.  All parties agreeable.   Betty Lame, MD  06/12/2017, 10:16 AM

## 2017-06-12 NOTE — Anesthesia Postprocedure Evaluation (Signed)
Anesthesia Post Note  Patient: Betty Vaughn  Procedure(s) Performed: Procedure(s) (LRB): COLONOSCOPY WITH PROPOFOL (N/A) POLYPECTOMY (N/A)  Patient location during evaluation: PACU Anesthesia Type: General Level of consciousness: awake and alert and oriented Pain management: satisfactory to patient Vital Signs Assessment: post-procedure vital signs reviewed and stable Respiratory status: spontaneous breathing, nonlabored ventilation and respiratory function stable Cardiovascular status: blood pressure returned to baseline and stable Postop Assessment: Adequate PO intake and No signs of nausea or vomiting Anesthetic complications: no    Raliegh Ip

## 2017-06-12 NOTE — Transfer of Care (Signed)
Immediate Anesthesia Transfer of Care Note  Patient: Betty Vaughn  Procedure(s) Performed: Procedure(s): COLONOSCOPY WITH PROPOFOL (N/A) POLYPECTOMY (N/A)  Patient Location: PACU  Anesthesia Type: General  Level of Consciousness: awake, alert  and patient cooperative  Airway and Oxygen Therapy: Patient Spontanous Breathing and Patient connected to supplemental oxygen  Post-op Assessment: Post-op Vital signs reviewed, Patient's Cardiovascular Status Stable, Respiratory Function Stable, Patent Airway and No signs of Nausea or vomiting  Post-op Vital Signs: Reviewed and stable  Complications: No apparent anesthesia complications

## 2017-06-12 NOTE — Op Note (Signed)
[facility] Patient Name: Betty Vaughn Procedure Date: 06/12/2017 10:43 AM MRN: [id number] Account #: [id number] Date of Birth: [birth year] Admit Type: Outpatient Age: 66 Room: [facility] OR ROOM 01 Gender: Female Note Status: Finalized Procedure:            Colonoscopy Indications:          High risk colon cancer surveillance: Personal history                        of colonic polyps Providers:            Lucilla Lame MD, MD Referring MD:         Halina Maidens, MD (Referring MD) Medicines:            Propofol per Anesthesia Complications:        No immediate complications. Procedure:            Pre-Anesthesia Assessment:                       - Prior to the procedure, a History and Physical was                        performed, and patient medications and allergies were                        reviewed. The patient's tolerance of previous                        anesthesia was also reviewed. The risks and benefits of                        the procedure and the sedation options and risks were                        discussed with the patient. All questions were                        answered, and informed consent was obtained. Prior                        Anticoagulants: The patient has taken no previous                        anticoagulant or antiplatelet agents. ASA Grade                        Assessment: II - A patient with mild systemic disease.                        After reviewing the risks and benefits, the patient was                        deemed in satisfactory condition to undergo the                        procedure.                       After obtaining informed consent, the colonoscope was  passed under direct vision. Throughout the procedure,                        the patient's blood pressure, pulse, and oxygen                        saturations were monitored continuously. The Olympus   CF-HQ190L Colonoscope (S#. [id number]) was introduced                        through the anus and advanced to the the cecum,                        identified by appendiceal orifice and ileocecal valve.                        The colonoscopy was performed without difficulty. The                        patient tolerated the procedure well. The quality of                        the bowel preparation was excellent. Findings:      The perianal and digital rectal examinations were normal.      A 10 mm polyp was found in the rectum. The polyp was pedunculated. The       polyp was removed with a cold snare. Resection and retrieval were       complete.      Two sessile polyps were found in the rectum. The polyps were 2 to 3 mm       in size. These polyps were removed with a cold biopsy forceps. Resection       and retrieval were complete.      Non-bleeding internal hemorrhoids were found during retroflexion. The       hemorrhoids were Grade II (internal hemorrhoids that prolapse but reduce       spontaneously). Impression:           - One 10 mm polyp in the rectum, removed with a cold                        snare. Resected and retrieved.                       - Two 2 to 3 mm polyps in the rectum, removed with a                        cold biopsy forceps. Resected and retrieved.                       - Non-bleeding internal hemorrhoids. Recommendation:       - Discharge patient to home.                       - Resume previous diet.                       - Continue present medications. Procedure Code(s):    --- Professional ---  45385, Colonoscopy, flexible; with removal of tumor(s),                        polyp(s), or other lesion(s) by snare technique                       45380, 34, Colonoscopy, flexible; with biopsy, single                        or multiple Diagnosis Code(s):    --- Professional ---                       Z86.010, Personal history of colonic polyps                        K62.1, Rectal polyp CPT copyright 2016 American Medical Association. All rights reserved. The codes documented in this report are preliminary and upon coder review may  be revised to meet current compliance requirements. Lucilla Lame MD, MD 06/12/2017 11:15:02 AM This report has been signed electronically. Number of Addenda: 0 Note Initiated On: 06/12/2017 10:43 AM Scope Withdrawal Time: 0 hours 9 minutes 6 seconds  Total Procedure Duration: 0 hours 12 minutes 12 seconds       [facility]

## 2017-06-12 NOTE — Anesthesia Preprocedure Evaluation (Signed)
Anesthesia Evaluation  Patient identified by MRN, date of birth, ID band Patient awake    Reviewed: Allergy & Precautions, H&P , NPO status , Patient's Chart, lab work & pertinent test results  Airway Mallampati: III  TM Distance: >3 FB Neck ROM: full    Dental no notable dental hx.    Pulmonary Current Smoker,    Pulmonary exam normal breath sounds clear to auscultation       Cardiovascular hypertension, + CAD  Normal cardiovascular exam Rhythm:regular Rate:Normal     Neuro/Psych PSYCHIATRIC DISORDERS    GI/Hepatic GERD  ,  Endo/Other  diabetes  Renal/GU      Musculoskeletal   Abdominal   Peds  Hematology   Anesthesia Other Findings   Reproductive/Obstetrics                             Anesthesia Physical Anesthesia Plan  ASA: III  Anesthesia Plan: General   Post-op Pain Management:    Induction:   PONV Risk Score and Plan: 3 and Propofol  Airway Management Planned:   Additional Equipment:   Intra-op Plan:   Post-operative Plan:   Informed Consent: I have reviewed the patients History and Physical, chart, labs and discussed the procedure including the risks, benefits and alternatives for the proposed anesthesia with the patient or authorized representative who has indicated his/her understanding and acceptance.     Plan Discussed with: CRNA  Anesthesia Plan Comments:         Anesthesia Quick Evaluation

## 2017-06-12 NOTE — Anesthesia Procedure Notes (Signed)
Performed by: Shalicia Craghead Pre-anesthesia Checklist: Patient identified, Emergency Drugs available, Suction available, Timeout performed and Patient being monitored Patient Re-evaluated:Patient Re-evaluated prior to induction Oxygen Delivery Method: Nasal cannula Placement Confirmation: positive ETCO2       

## 2017-06-13 ENCOUNTER — Encounter: Payer: Self-pay | Admitting: Gastroenterology

## 2017-06-16 ENCOUNTER — Encounter: Payer: Self-pay | Admitting: Gastroenterology

## 2017-06-17 NOTE — Progress Notes (Signed)
06/18/2017 3:39 PM   Betty Vaughn May 10, 1951 [id number]  Referring provider: Glean Hess, MD [address] [place], [place] [address]  Chief Complaint  Patient presents with  . Urinary Urgency    3 week follow up  . Vaginal Atrophy    HPI: 66 yo AAF with urgency, vaginal atrophy and pelvic pain wiho presents today for a 3 week follow up.  Background history Patient is a 7 -year-old [place] American female who is referred to [place] by, Dr. Halina Maidens, for pain with urination.  She states that she has been having issues with urgency and feelings of incomplete emptying for over the four years.  It has been getting worse over the last few months.  She is having to strain to get the urine out and feels a pulling sensation in her vagina.  She has nocturia x 3 -4, hesitancy and a weak urinary stream.  Patient states that she has not had urinary tract infections over the last year.   She denies dysuria, gross hematuria, suprapubic pain, back pain, abdominal pain or flank pain.  She has not had any recent fevers, chills or vomiting.  She has nausea that she attributes to her GERD.  She does not have a history of nephrolithiasis, GU surgery or GU trauma.  She is not sexually active.  She is postmenopausal.  She admits to constipation.  She does not have incontinence.   She is not having pain with bladder filling.  She has not had any recent imaging studies.  She is drinking 4 bottles of water daily.   3 to 4 cups of coffee daily.  No sodas.  She has a glass of wine nightly.  Her UA was unremarkable.  Her PVR was 0 mL.    At her visit three weeks ago, she was started on Myrbetriq 25 mg and Premarin cream.  She has underwent a colonoscopy and findings were two polyps, but she is still waiting on the path.     Today, the patient has been experiencing urgency x none, frequency x 0-3, not restricting fluids to avoid visits to the restroom, is engaging in toilet mapping, incontinence  x 0-3 and nocturia x 0-3.   She denies dysuria, gross hematuria and suprapubic pain. She has not had recent fevers, chills, nausea or vomiting. Her PVR today 0 mL.    She states the Mybetriq did not help her symptoms.  She is still having to strain to urinate and only urinating a few times daily.  Residuals remain low.    She is using the Premarin cream three nights weekly.    PMH: Past Medical History:  Diagnosis Date  . Allergy   . Arthritis   . Chronic insomnia   . Colon cancer ([place] [place])    polyp was cancerous  . Diabetes mellitus without complication ([facility])   . GERD (gastroesophageal reflux disease)   . Hx of colonic polyps   . Hyperlipidemia   . Hypertension   . Neuromuscular disorder ([facility])   . Psoriasis    scalp    Surgical History: Past Surgical History:  Procedure Laterality Date  . ABDOMINAL HYSTERECTOMY     total  . CERVICAL FUSION  2005  . CHOLECYSTECTOMY    . COLONOSCOPY WITH PROPOFOL N/A 06/12/2017   Procedure: COLONOSCOPY WITH PROPOFOL;  Surgeon: Lucilla Lame, MD;  Location: [facility];  Service: Gastroenterology;  Laterality: N/A;  . 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"  .  HAND SURGERY Bilateral 2001  . POLYPECTOMY  2000  . POLYPECTOMY N/A 06/12/2017   Procedure: POLYPECTOMY;  Surgeon: Lucilla Lame, MD;  Location: [facility];  Service: Gastroenterology;  Laterality: N/A;  . rigger finger      Home Medications:  Allergies as of 06/18/2017      Reactions   Azithromycin Swelling   Propoxyphene Anaphylaxis   Tramadol Anaphylaxis   Penicillins Swelling   Piroxicam Rash   Sertraline Hcl Rash      Medication List       Accurate as of 06/18/17  3:39 PM. Always use your most recent med list.          ACCU-CHEK AVIVA PLUS w/Device Kit use to TEST BLOOD SUGAR   accu-chek multiclix lancets TEST BLOOD SUGAR daily or as directed by prescriber   albuterol 108 (90 Base) MCG/ACT inhaler Commonly known as:  PROVENTIL HFA;VENTOLIN  HFA Inhale 2 puffs into the lungs every 6 (six) hours as needed for wheezing or shortness of breath.   amLODipine 5 MG tablet Commonly known as:  NORVASC Take 1 tablet (5 mg total) by mouth daily.   atorvastatin 40 MG tablet Commonly known as:  LIPITOR Take 1 tablet (40 mg total) by mouth daily.   chlorthalidone 25 MG tablet Commonly known as:  HYGROTON Take 1 tablet (25 mg total) by mouth daily.   DULoxetine 60 MG capsule Commonly known as:  CYMBALTA Take 1 capsule (60 mg total) by mouth daily.   gabapentin 300 MG capsule Commonly known as:  NEURONTIN Take 1 capsule (300 mg total) by mouth at bedtime.   glucose blood test strip   meloxicam 15 MG tablet Commonly known as:  MOBIC Take 1 tablet (15 mg total) by mouth daily.   methocarbamol 750 MG tablet Commonly known as:  ROBAXIN take 1 tablet by mouth twice a day if needed   metoprolol tartrate 50 MG tablet Commonly known as:  LOPRESSOR Take 1 tablet (50 mg total) by mouth 2 (two) times daily.   neomycin-polymyxin b-dexamethasone 3.5-10000-0.1 Susp Commonly known as:  MAXITROL Place 2 drops into both eyes every 6 (six) hours.   nitroGLYCERIN 0.4 MG SL tablet Commonly known as:  NITROSTAT Place under the tongue.   potassium chloride SA 20 MEQ tablet Commonly known as:  K-DUR,KLOR-CON Take 1 tablet (20 mEq total) by mouth daily.   traZODone 50 MG tablet Commonly known as:  DESYREL Take 1 tablet (50 mg total) by mouth at bedtime.       Allergies:  Allergies  Allergen Reactions  . Azithromycin Swelling  . Propoxyphene Anaphylaxis  . Tramadol Anaphylaxis  . Penicillins Swelling  . Piroxicam Rash  . Sertraline Hcl Rash    Family History: Family History  Problem Relation Age of Onset  . Breast cancer Mother 48  . Diabetes Mother   . CAD Father   . Heart attack Father   . Diabetes Daughter   . Kidney failure Daughter   . Heart attack Sister   . Sickle cell anemia Other   . Kidney cancer Neg Hx    . Bladder Cancer Neg Hx     Social History:  reports that she has been smoking Cigarettes.  She has a 15.00 pack-year smoking history. She has never used smokeless tobacco. She reports that she drinks about 2.4 oz of alcohol per week . She reports that she does not use drugs.  ROS: UROLOGY Frequent Urination?: No Hard to postpone urination?: No Burning/pain with urination?:  No Get up at night to urinate?: No Leakage of urine?: No Urine stream starts and stops?: No Trouble starting stream?: No Do you have to strain to urinate?: Yes Blood in urine?: No Urinary tract infection?: No Sexually transmitted disease?: No Injury to kidneys or bladder?: No Painful intercourse?: No Weak stream?: No Currently pregnant?: No Vaginal bleeding?: No Last menstrual period?: n  Gastrointestinal Nausea?: No Vomiting?: No Indigestion/heartburn?: No Diarrhea?: No Constipation?: Yes  Constitutional Fever: No Night sweats?: No Weight loss?: No Fatigue?: No  Skin Skin rash/lesions?: No Itching?: No  Eyes Blurred vision?: No Double vision?: No  Ears/Nose/Throat Sore throat?: No Sinus problems?: No  Hematologic/Lymphatic Swollen glands?: No Easy bruising?: No  Cardiovascular Leg swelling?: No Chest pain?: No  Respiratory Cough?: No Shortness of breath?: No  Endocrine Excessive thirst?: No  Musculoskeletal Back pain?: Yes Joint pain?: Yes  Neurological Headaches?: No Dizziness?: No  Psychologic Depression?: No Anxiety?: No  Physical Exam: BP 137/83   Pulse (!) 59   Ht 5' 1"  (1.549 m)   Wt 133 lb 8 oz (60.6 kg)   BMI 25.22 kg/m   Constitutional: Well nourished. Alert and oriented, No acute distress. HEENT: [place] AT, moist mucus membranes. Trachea midline, no masses. Cardiovascular: No clubbing, cyanosis, or edema. Respiratory: Normal respiratory effort, no increased work of breathing. Skin: No rashes, bruises or suspicious lesions. Lymph: No cervical or  inguinal adenopathy. Neurologic: Grossly intact, no focal deficits, moving all 4 extremities. Psychiatric: Normal mood and affect.  Laboratory Data: Lab Results  Component Value Date   WBC 7.5 04/28/2017   HGB 13.4 04/28/2017   HCT 39.6 04/28/2017   MCV 91 04/28/2017   PLT 251 04/28/2017    Lab Results  Component Value Date   CREATININE 1.24 (H) 04/28/2017     Lab Results  Component Value Date   HGBA1C 5.8 (H) 04/28/2017    Lab Results  Component Value Date   TSH 2.300 04/28/2017       Component Value Date/Time   CHOL 165 04/28/2017 1153   HDL 56 04/28/2017 1153   CHOLHDL 2.9 04/28/2017 1153   LDLCALC 94 04/28/2017 1153    Lab Results  Component Value Date   AST 24 04/28/2017   Lab Results  Component Value Date   ALT 18 04/28/2017    I have independently reviewed the labs.   Pertinent Imaging: Results for CHRISTENE, POUNDS (MRN [id number]) as of 06/18/2017 15:43  Ref. Range 06/18/2017 15:08  Scan Result Unknown 0      Assessment & Plan:    1. Urgency  - offered behavioral therapies, bladder training and bladder control strategies - patient deferred PT  - pelvic floor muscle training  - fluid management - good water intake  - Myrbetriq ineffective  - RTC for UDS report  2. Vaginal atrophy  - continue vaginal estrogen cream  - She will follow up in three months for an exam.    3. Pelvic pain  - resolved  4. Voiding dysfunction  - patient may be describing a problem with bladder compliance and/or sphincter dysfunction - will schedule UDS for further evaluation - I explained to the patient that urodynamics is a study that assesses how the bladder and urethra are performing their job of storing and releasing urine.  Urodynamic tests can help explain symptoms such as: incontinence, frequent urination, urgency, hesitancy, dysuria, urinary retention and/or recurrent UTI's.  To perform the test, special catheter is placed into the urethra, a rectal  catheter is  placed and electrodes are placed around the perineum.  The bladder is filled with water and the patient will be asked to void, it they can.  There may be a video component involved at some center, where the bladder activity is monitored by a video   Return for UDS report.  These notes generated with voice recognition software. I apologize for typographical errors.  Zara Council, [facility] [address], [place], [place] [address] [phone]

## 2017-06-18 ENCOUNTER — Ambulatory Visit (INDEPENDENT_AMBULATORY_CARE_PROVIDER_SITE_OTHER): Payer: Medicare HMO | Admitting: Urology

## 2017-06-18 ENCOUNTER — Encounter: Payer: Self-pay | Admitting: Urology

## 2017-06-18 VITALS — BP 137/83 | HR 59 | Ht 61.0 in | Wt 133.5 lb

## 2017-06-18 DIAGNOSIS — N952 Postmenopausal atrophic vaginitis: Secondary | ICD-10-CM | POA: Diagnosis not present

## 2017-06-18 DIAGNOSIS — N398 Other specified disorders of urinary system: Secondary | ICD-10-CM | POA: Diagnosis not present

## 2017-06-18 DIAGNOSIS — R3915 Urgency of urination: Secondary | ICD-10-CM | POA: Diagnosis not present

## 2017-06-18 LAB — BLADDER SCAN AMB NON-IMAGING: Scan Result: 0

## 2017-06-29 ENCOUNTER — Encounter: Payer: Self-pay | Admitting: Gastroenterology

## 2017-07-21 DIAGNOSIS — R3915 Urgency of urination: Secondary | ICD-10-CM | POA: Diagnosis not present

## 2017-07-21 DIAGNOSIS — R351 Nocturia: Secondary | ICD-10-CM | POA: Diagnosis not present

## 2017-07-22 ENCOUNTER — Other Ambulatory Visit: Payer: Self-pay | Admitting: Urology

## 2017-08-06 ENCOUNTER — Ambulatory Visit: Payer: Medicare HMO | Admitting: Urology

## 2017-08-23 ENCOUNTER — Other Ambulatory Visit: Payer: Self-pay | Admitting: Internal Medicine

## 2017-08-25 ENCOUNTER — Encounter: Payer: Self-pay | Admitting: Urology

## 2017-08-25 ENCOUNTER — Ambulatory Visit (INDEPENDENT_AMBULATORY_CARE_PROVIDER_SITE_OTHER): Payer: Medicare HMO | Admitting: Urology

## 2017-08-25 VITALS — BP 148/88 | HR 76 | Ht 61.0 in | Wt 129.3 lb

## 2017-08-25 DIAGNOSIS — R3915 Urgency of urination: Secondary | ICD-10-CM | POA: Diagnosis not present

## 2017-08-25 DIAGNOSIS — R3912 Poor urinary stream: Secondary | ICD-10-CM

## 2017-08-25 MED ORDER — TAMSULOSIN HCL 0.4 MG PO CAPS: 0.4000 mg | ORAL_CAPSULE | Freq: Every day | ORAL | 11 refills | Status: DC

## 2017-08-25 NOTE — Telephone Encounter (Signed)
Pt is scheduled for this week

## 2017-08-25 NOTE — Progress Notes (Signed)
08/25/2017 1:31 PM   Betty Vaughn 1951/02/15 [id number]  Referring provider: Glean Hess, MD [address] [place], [place] [address]  Chief Complaint  Patient presents with  . Follow-up    UDS results    HPI: Betty Vaughn:   She states that she has been having issues with urgency and feelings of incomplete emptying for over the four years.  It has been getting worse over the last few months.  She is having to strain to get the urine out and feels a pulling sensation in her vagina.  She has nocturia x 3 -4, hesitancy and a weak urinary stream.  Patient states that she has not had urinary tract infections over the last year. She admits to constipation.  She does not have incontinence.   She failed the beta 3 agonists and her residual was 0 mL.  Today Frequency and flow symptoms are stable. On urodynamics the patient emptied efficiently. Bladder capacity was 500 mL. Bladder was unstable reach a pressure of 9 cm water. She did not leak. She did not leak with a Valsalva pressure of 80 cm of water. During voiding she voided 500 mL with a maximum 25 mils per second. Maximum voiding pressure was 44 cm water and she emptied efficiently. EMG activity increased during the voiding phase. Bladder neck descent 1 cm. She did experience urgency with the instability. She did not complain of bladder pain.  PMH: Past Medical History:  Diagnosis Date  . Allergy   . Arthritis   . Chronic insomnia   . Colon cancer ([facility])    polyp was cancerous  . Diabetes mellitus without complication ([facility])   . GERD (gastroesophageal reflux disease)   . Hx of colonic polyps   . Hyperlipidemia   . Hypertension   . Neuromuscular disorder ([facility])   . Psoriasis    scalp    Surgical History: Past Surgical History:  Procedure Laterality Date  . ABDOMINAL HYSTERECTOMY     total  . CERVICAL FUSION  2005  . CHOLECYSTECTOMY    . COLONOSCOPY WITH PROPOFOL N/A 06/12/2017   Procedure: COLONOSCOPY WITH  PROPOFOL;  Surgeon: Lucilla Lame, MD;  Location: [facility];  Service: Gastroenterology;  Laterality: N/A;  . 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"  . HAND SURGERY Bilateral 2001  . POLYPECTOMY  2000  . POLYPECTOMY N/A 06/12/2017   Procedure: POLYPECTOMY;  Surgeon: Lucilla Lame, MD;  Location: [facility];  Service: Gastroenterology;  Laterality: N/A;  . rigger finger      Home Medications:  Allergies as of 08/25/2017      Reactions   Azithromycin Swelling   Propoxyphene Anaphylaxis   Tramadol Anaphylaxis   Penicillins Swelling   Piroxicam Rash   Sertraline Hcl Rash      Medication List       Accurate as of 08/25/17  1:31 PM. Always use your most recent med list.          ACCU-CHEK AVIVA PLUS w/Device Kit use to TEST BLOOD SUGAR   accu-chek multiclix lancets TEST BLOOD SUGAR daily or as directed by prescriber   albuterol 108 (90 Base) MCG/ACT inhaler Commonly known as:  PROVENTIL HFA;VENTOLIN HFA Inhale 2 puffs into the lungs every 6 (six) hours as needed for wheezing or shortness of breath.   amLODipine 5 MG tablet Commonly known as:  NORVASC take 1 tablet by mouth once daily   atorvastatin 40 MG tablet Commonly known as:  LIPITOR take 1 tablet  by mouth once daily   chlorthalidone 25 MG tablet Commonly known as:  HYGROTON take 1 tablet by mouth once daily   DULoxetine 60 MG capsule Commonly known as:  CYMBALTA Take 1 capsule (60 mg total) by mouth daily.   gabapentin 300 MG capsule Commonly known as:  NEURONTIN Take 1 capsule (300 mg total) by mouth at bedtime.   glucose blood test strip   meloxicam 15 MG tablet Commonly known as:  MOBIC Take 1 tablet (15 mg total) by mouth daily.   methocarbamol 750 MG tablet Commonly known as:  ROBAXIN take 1 tablet by mouth twice a day if needed   metoprolol tartrate 50 MG tablet Commonly known as:  LOPRESSOR Take 1 tablet (50 mg total) by mouth 2 (two) times daily.     neomycin-polymyxin b-dexamethasone 3.5-10000-0.1 Susp Commonly known as:  MAXITROL Place 2 drops into both eyes every 6 (six) hours.   nitroGLYCERIN 0.4 MG SL tablet Commonly known as:  NITROSTAT Place under the tongue.   potassium chloride SA 20 MEQ tablet Commonly known as:  K-DUR,KLOR-CON take 1 tablet by mouth once daily   tamsulosin 0.4 MG Caps capsule Commonly known as:  FLOMAX Take 1 capsule (0.4 mg total) by mouth daily.   traZODone 50 MG tablet Commonly known as:  DESYREL take 1 tablet by mouth at bedtime       Allergies:  Allergies  Allergen Reactions  . Azithromycin Swelling  . Propoxyphene Anaphylaxis  . Tramadol Anaphylaxis  . Penicillins Swelling  . Piroxicam Rash  . Sertraline Hcl Rash    Family History: Family History  Problem Relation Age of Onset  . Breast cancer Mother 20  . Diabetes Mother   . CAD Father   . Heart attack Father   . Diabetes Daughter   . Kidney failure Daughter   . Heart attack Sister   . Sickle cell anemia Other   . Kidney cancer Neg Hx   . Bladder Cancer Neg Hx     Social History:  reports that she has been smoking Cigarettes.  She has a 15.00 pack-year smoking history. She has never used smokeless tobacco. She reports that she drinks about 2.4 oz of alcohol per week . She reports that she does not use drugs.  ROS: UROLOGY Frequent Urination?: No Hard to postpone urination?: No Burning/pain with urination?: Yes Get up at night to urinate?: No Leakage of urine?: No Urine stream starts and stops?: Yes Trouble starting stream?: Yes Do you have to strain to urinate?: Yes Blood in urine?: No Urinary tract infection?: No Sexually transmitted disease?: No Injury to kidneys or bladder?: No Painful intercourse?: No Weak stream?: No Currently pregnant?: No Vaginal bleeding?: No Last menstrual period?: n  Gastrointestinal Nausea?: Yes Vomiting?: No Indigestion/heartburn?: Yes Diarrhea?: No Constipation?:  Yes  Constitutional Fever: No Night sweats?: No Weight loss?: No Fatigue?: Yes  Skin Skin rash/lesions?: Yes Itching?: Yes  Eyes Blurred vision?: Yes Double vision?: No  Ears/Nose/Throat Sore throat?: No Sinus problems?: No  Hematologic/Lymphatic Swollen glands?: No Easy bruising?: Yes  Cardiovascular Leg swelling?: No Chest pain?: Yes  Respiratory Cough?: No Shortness of breath?: No  Endocrine Excessive thirst?: Yes  Musculoskeletal Back pain?: Yes Joint pain?: Yes  Neurological Headaches?: Yes Dizziness?: No  Psychologic Depression?: Yes Anxiety?: Yes  Physical Exam: BP (!) 148/88 (BP Location: Right Arm, Patient Position: Sitting, Cuff Size: Normal)   Pulse 76   Ht 5' 1"  (1.549 m)   Wt 129 lb 4.8 oz (58.7  kg)   BMI 24.43 kg/m     Laboratory Data: Lab Results  Component Value Date   WBC 7.5 04/28/2017   HGB 13.4 04/28/2017   HCT 39.6 04/28/2017   MCV 91 04/28/2017   PLT 251 04/28/2017    Lab Results  Component Value Date   CREATININE 1.24 (H) 04/28/2017    No results found for: PSA  No results found for: TESTOSTERONE  Lab Results  Component Value Date   HGBA1C 5.8 (H) 04/28/2017    Urinalysis    Component Value Date/Time   COLORURINE Straw 07/06/2013 1813   APPEARANCEUR Clear 05/21/2017 1327   LABSPEC 1.025 07/06/2013 1813   PHURINE 7.0 07/06/2013 1813   GLUCOSEU Negative 05/21/2017 1327   GLUCOSEU Negative 07/06/2013 1813   HGBUR 1+ 07/06/2013 1813   BILIRUBINUR Negative 05/21/2017 1327   BILIRUBINUR Negative 07/06/2013 1813   KETONESUR Negative 07/06/2013 1813   PROTEINUR Negative 05/21/2017 1327   PROTEINUR Negative 07/06/2013 1813   UROBILINOGEN 0.2 04/28/2017 1141   NITRITE Negative 05/21/2017 1327   NITRITE Negative 07/06/2013 1813   LEUKOCYTESUR Negative 05/21/2017 1327   LEUKOCYTESUR Negative 07/06/2013 1813    Pertinent Imaging: none  Assessment & Plan:  The patient does hesitate before urination. She  can stop and start. She does not always feel empty. Clinically not infected. She has had a pulling sensation in the suprapubic area especially with voiding. I think she's had chronic pain issues perhaps.  Reassess in about 5 weeks on Flomax and if her symptoms are not dramatically better she understands I will perform a cystoscopy then. She may or may not need a CT scan or cystoscopy with hydrodistention in the future.  There are no diagnoses linked to this encounter.  No Follow-up on file.  Reece Packer, MD  [facility] [address] [place], [place] [address] [phone]

## 2017-08-26 MED ORDER — TAMSULOSIN HCL 0.4 MG PO CAPS: 0.4000 mg | ORAL_CAPSULE | Freq: Every day | ORAL | 11 refills | Status: DC

## 2017-08-26 NOTE — Addendum Note (Signed)
Addended by: Kyra Manges on: 08/26/2017 03:03 PM   Modules accepted: Orders

## 2017-08-28 ENCOUNTER — Ambulatory Visit: Payer: Medicare HMO | Admitting: Internal Medicine

## 2017-08-29 ENCOUNTER — Encounter: Payer: Self-pay | Admitting: Internal Medicine

## 2017-08-29 ENCOUNTER — Ambulatory Visit (INDEPENDENT_AMBULATORY_CARE_PROVIDER_SITE_OTHER): Payer: Medicare HMO | Admitting: Internal Medicine

## 2017-08-29 VITALS — BP 102/62 | HR 72 | Ht 61.0 in | Wt 132.0 lb

## 2017-08-29 DIAGNOSIS — R7302 Impaired glucose tolerance (oral): Secondary | ICD-10-CM | POA: Diagnosis not present

## 2017-08-29 DIAGNOSIS — F321 Major depressive disorder, single episode, moderate: Secondary | ICD-10-CM

## 2017-08-29 DIAGNOSIS — I10 Essential (primary) hypertension: Secondary | ICD-10-CM | POA: Diagnosis not present

## 2017-08-29 DIAGNOSIS — F17200 Nicotine dependence, unspecified, uncomplicated: Secondary | ICD-10-CM

## 2017-08-29 DIAGNOSIS — R21 Rash and other nonspecific skin eruption: Secondary | ICD-10-CM

## 2017-08-29 NOTE — Patient Instructions (Signed)
You need to get Influenza vaccine and the first pneumonia vaccine called Prevnar-13.  You will get Pneumococcal vaccine next year.

## 2017-08-29 NOTE — Progress Notes (Signed)
Date:  08/29/2017   Name:  Betty Vaughn   DOB:  February 10, 1951   MRN:  [id number]   Chief Complaint: Rash (On legs and arms . itching, putting neosporin and itch releif cream. Started two weeks ago. Some are welping. Dont know if its bug bites or stress rash.); Hypertension; Depression (PHQ9- 14.); and Glucose Intolerence Rash  This is a new problem. The problem has been gradually improving since onset. The affected locations include the left lower leg and left arm. Associated with: mosquitos. Pertinent negatives include no cough, diarrhea, fatigue, fever, shortness of breath or vomiting.  Hypertension  This is a chronic problem. The problem is unchanged. The problem is controlled. Pertinent negatives include no chest pain, headaches, palpitations or shortness of breath. Past treatments include beta blockers and diuretics.  Diabetes  She presents for her follow-up diabetic visit. She has type 2 diabetes mellitus. Pertinent negatives for hypoglycemia include no dizziness or headaches. Pertinent negatives for diabetes include no chest pain and no fatigue. Current diabetic treatment includes diet.  Depression         This is a chronic problem.  The problem has been waxing and waning since onset.  Associated symptoms include insomnia, decreased interest and sad.  Associated symptoms include no fatigue, no headaches and no suicidal ideas.     The symptoms are aggravated by family issues and social issues.  Past treatments include SNRIs - Serotonin and norepinephrine reuptake inhibitors.  Compliance with treatment is good.  Previous treatment provided moderate relief. Renal insuff - does not take any nsaids or aspirin.  Most recent labs showed elevated CR.  She believes that she consumes adequate fluids.  Lab Results  Component Value Date   HGBA1C 5.8 (H) 04/28/2017   Lab Results  Component Value Date   CREATININE 1.24 (H) 04/28/2017   BUN 19 04/28/2017   NA 141 04/28/2017   K 4.0 04/28/2017     CL 101 04/28/2017   CO2 25 04/28/2017     Review of Systems  Constitutional: Negative for chills, fatigue and fever.  HENT: Negative for trouble swallowing.   Respiratory: Negative for cough, chest tightness and shortness of breath.   Cardiovascular: Negative for chest pain, palpitations and leg swelling.  Gastrointestinal: Negative for abdominal pain, diarrhea and vomiting.  Musculoskeletal: Negative for arthralgias.  Skin: Positive for rash.  Allergic/Immunologic: Positive for environmental allergies.  Neurological: Negative for dizziness and headaches.  Hematological: Negative for adenopathy.  Psychiatric/Behavioral: Positive for depression, dysphoric mood and sleep disturbance. Negative for suicidal ideas. The patient has insomnia.     Patient Active Problem List   Diagnosis Date Noted  . Personal history of colonic polyps   . Rectal polyp   . Tobacco use disorder 11/20/2016  . Neuropathy 04/10/2016  . Glucose intolerance (impaired glucose tolerance) 05/13/2015  . Benign essential tremor 05/13/2015  . Chronic insomnia 05/13/2015  . 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 Multiple joint complaints 05/13/2015  . Clinical depression 02/23/2014  . Acid reflux 02/23/2014  . Essential hypertension 02/23/2014  . HLD (hyperlipidemia) 02/23/2014  . Facet arthritis of lumbar region ([place]) 01/16/2012  . Chronic headache disorder 01/16/2012  . 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 CAD S/P percutaneous coronary angioplasty 03/16/2010    Prior to Admission medications   Medication Sig Start Date End Date Taking? Authorizing Provider  albuterol (PROVENTIL HFA;VENTOLIN HFA) 108 (90 Base) MCG/ACT inhaler Inhale 2 puffs into the lungs every 6 (six)  hours as needed for wheezing or shortness of breath. 11/10/16  Yes Orbie Pyo, MD  amLODipine (NORVASC) 5 MG tablet take 1 tablet by mouth once daily 08/24/17  Yes Glean Hess, MD   atorvastatin (LIPITOR) 40 MG tablet take 1 tablet by mouth once daily 08/24/17  Yes Glean Hess, MD  Blood Glucose Monitoring Suppl (ACCU-CHEK AVIVA PLUS) W/DEVICE KIT use to TEST BLOOD SUGAR 06/21/15  Yes Glean Hess, MD  chlorthalidone (HYGROTON) 25 MG tablet take 1 tablet by mouth once daily 08/24/17  Yes Glean Hess, MD  DULoxetine (CYMBALTA) 60 MG capsule Take 1 capsule (60 mg total) by mouth daily. 02/04/17  Yes Glean Hess, MD  gabapentin (NEURONTIN) 300 MG capsule Take 1 capsule (300 mg total) by mouth at bedtime. 02/04/17  Yes Glean Hess, MD  glucose blood test strip  11/09/14  Yes [provider]  Lancets (ACCU-CHEK MULTICLIX) lancets TEST BLOOD SUGAR daily or as directed by prescriber 07/09/15  Yes Glean Hess, MD  meloxicam (MOBIC) 15 MG tablet Take 1 tablet (15 mg total) by mouth daily. 02/04/17  Yes Glean Hess, MD  methocarbamol (ROBAXIN) 750 MG tablet take 1 tablet by mouth twice a day if needed 01/23/17  Yes Glean Hess, MD  metoprolol (LOPRESSOR) 50 MG tablet Take 1 tablet (50 mg total) by mouth 2 (two) times daily. 02/04/17  Yes Glean Hess, MD  nitroGLYCERIN (NITROSTAT) 0.4 MG SL tablet Place under the tongue.   Yes [provider]  potassium chloride SA (K-DUR,KLOR-CON) 20 MEQ tablet take 1 tablet by mouth once daily 08/24/17  Yes Glean Hess, MD  tamsulosin (FLOMAX) 0.4 MG CAPS capsule Take 1 capsule (0.4 mg total) by mouth daily. 08/26/17  Yes MacDiarmid, Nicki Reaper, MD  traZODone (DESYREL) 50 MG tablet take 1 tablet by mouth at bedtime 08/24/17  Yes Glean Hess, MD  neomycin-polymyxin b-dexamethasone (MAXITROL) 3.5-10000-0.1 SUSP Place 2 drops into both eyes every 6 (six) hours. Patient not taking: Reported on 08/29/2017 11/08/16   Glean Hess, MD    Allergies  Allergen Reactions  . Azithromycin Swelling  . Propoxyphene Anaphylaxis  . Tramadol Anaphylaxis  . Penicillins Swelling  . Piroxicam Rash   . Sertraline Hcl Rash    Past Surgical History:  Procedure Laterality Date  . ABDOMINAL HYSTERECTOMY     total  . CERVICAL FUSION  2005  . CHOLECYSTECTOMY    . COLONOSCOPY WITH PROPOFOL N/A 06/12/2017   Procedure: COLONOSCOPY WITH PROPOFOL;  Surgeon: Lucilla Lame, MD;  Location: [facility];  Service: Gastroenterology;  Laterality: N/A;  . 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"  . HAND SURGERY Bilateral 2001  . POLYPECTOMY  2000  . POLYPECTOMY N/A 06/12/2017   Procedure: POLYPECTOMY;  Surgeon: Lucilla Lame, MD;  Location: [facility];  Service: Gastroenterology;  Laterality: N/A;  . rigger finger      Social History  Substance Use Topics  . Smoking status: Current Every Day Smoker    Packs/day: 0.50    Years: 30.00    Types: Cigarettes  . Smokeless tobacco: Never Used  . Alcohol use 2.4 oz/week    4 Standard drinks or equivalent per week     Medication list has been reviewed and updated.  PHQ 2/9 Scores 08/29/2017 04/28/2017 04/10/2016  PHQ - 2 Score 6 0 0  PHQ- 9 Score 14 - -    Physical Exam  Constitutional: She is oriented to person,  place, and time. She appears well-developed. No distress.  HENT:  Head: Normocephalic and atraumatic.  Neck: Normal range of motion. Neck supple.  Cardiovascular: Normal rate, regular rhythm and normal heart sounds.   Pulmonary/Chest: Effort normal and breath sounds normal. No respiratory distress. She has no wheezes.  Musculoskeletal: She exhibits no edema.  Neurological: She is alert and oriented to person, place, and time.  Skin: Skin is warm and dry. Rash noted.  Red papules over left lower leg c/w insect bites  Psychiatric: Her speech is normal and behavior is normal. Judgment and thought content normal. Cognition and memory are normal. She exhibits a depressed mood.  Nursing note and vitals reviewed.   BP 102/62   Pulse 72   Ht _0  (1.549 m)   Wt 132 lb (59.9 kg)   BMI 24.94 kg/m   Assessment  and Plan: 1. Essential hypertension Controlled Abnormal labs last visit - repeat today - Basic metabolic panel  2. Glucose intolerance (impaired glucose tolerance) Not diabetes - continue to monitor - Hemoglobin A1c  3. Tobacco use disorder Encouraged pt to quit completely  4. Rash Continue topical steroids PRN  5. Current moderate episode of major depressive disorder without prior episode ([place]) Continue cymbalta and trazodone  Pt declines flu and prevnar-13 - she says she will get them at [facility].  No orders of the defined types were placed in this encounter.   Partially dictated using Editor, commissioning. Any errors are unintentional.  Halina Maidens, MD [facility]  08/29/2017

## 2017-08-30 LAB — BASIC METABOLIC PANEL
BUN / CREAT RATIO: 23 (ref 12–28)
BUN: 27 mg/dL (ref 8–27)
CHLORIDE: 102 mmol/L (ref 96–106)
CO2: 28 mmol/L (ref 20–29)
Calcium: 9.8 mg/dL (ref 8.7–10.3)
Creatinine, Ser: 1.19 mg/dL — ABNORMAL HIGH (ref 0.57–1.00)
GFR calc non Af Amer: 48 mL/min/{1.73_m2} — ABNORMAL LOW (ref >59)
GFR, EST AFRICAN AMERICAN: 55 mL/min/{1.73_m2} — AB (ref >59)
GLUCOSE: 91 mg/dL (ref 65–99)
POTASSIUM: 4.1 mmol/L (ref 3.5–5.2)
SODIUM: 142 mmol/L (ref 134–144)

## 2017-08-30 LAB — HEMOGLOBIN A1C
Est. average glucose Bld gHb Est-mCnc: 117 mg/dL
Hgb A1c MFr Bld: 5.7 % — ABNORMAL HIGH (ref 4.8–5.6)

## 2017-09-22 ENCOUNTER — Other Ambulatory Visit: Payer: Self-pay | Admitting: Internal Medicine

## 2017-09-22 ENCOUNTER — Ambulatory Visit
Admission: RE | Admit: 2017-09-22 | Discharge: 2017-09-22 | Disposition: A | Discharge status: Home / Self Care | Payer: Medicare HMO | Source: Ambulatory Visit | Attending: Internal Medicine | Admitting: Internal Medicine

## 2017-09-22 DIAGNOSIS — Z1239 Encounter for other screening for malignant neoplasm of breast: Secondary | ICD-10-CM

## 2017-09-22 DIAGNOSIS — Z1231 Encounter for screening mammogram for malignant neoplasm of breast: Secondary | ICD-10-CM | POA: Insufficient documentation

## 2017-09-29 ENCOUNTER — Ambulatory Visit (INDEPENDENT_AMBULATORY_CARE_PROVIDER_SITE_OTHER): Payer: Medicare HMO | Admitting: Urology

## 2017-09-29 ENCOUNTER — Encounter: Payer: Self-pay | Admitting: Urology

## 2017-09-29 DIAGNOSIS — R3915 Urgency of urination: Secondary | ICD-10-CM | POA: Diagnosis not present

## 2017-09-29 DIAGNOSIS — R3912 Poor urinary stream: Secondary | ICD-10-CM | POA: Diagnosis not present

## 2017-09-29 LAB — URINALYSIS, COMPLETE
BILIRUBIN UA: NEGATIVE
GLUCOSE, UA: NEGATIVE
KETONES UA: NEGATIVE
NITRITE UA: NEGATIVE
Protein, UA: NEGATIVE
SPEC GRAV UA: 1.01 (ref 1.005–1.030)
UUROB: 0.2 mg/dL (ref 0.2–1.0)
pH, UA: 5.5 (ref 5.0–7.5)

## 2017-09-29 NOTE — Progress Notes (Signed)
09/29/2017 2:16 PM   Betty Vaughn 28-Aug-1951 [id number]  Referring provider: Glean Hess, MD [address] [place], [place] [address]  Chief Complaint  Patient presents with  . Urinary Incontinence    HPI: Betty Vaughn:   She states that she has been having issues with urgency and feelings of incomplete emptying for over the four years.  She is having to strain to get the urine out and feels a pulling sensation in her vagina. She has nocturia x 3 -4, hesitancy and a weak urinary stream.   She failed the beta 3 agonists and her residual was 0 mL.  Last visit Frequency and flow symptoms are stable. On urodynamics the patient emptied efficiently. Bladder capacity was 500 mL. Bladder was unstable reach a pressure of 9 cm water. She did not leak. She did not leak with a Valsalva pressure of 80 cm of water. During voiding she voided 500 mL with a maximum 25 mils per second. Maximum voiding pressure was 44 cm water and she emptied efficiently. EMG activity increased during the voiding phase. Bladder neck descent 1 cm. She did experience urgency with the instability. She did not complain of bladder pain.  The patient does hesitate before urination. She can stop and start. She does not always feel empty. Clinically not infected. She has had a pulling sensation in the suprapubic area especially with voiding. I think she's had chronic pain issues perhaps.  Reassess in about 5 weeks on Flomax and if her symptoms are not dramatically better she understands I will perform a cystoscopy then. She may or may not need a CT scan or cystoscopy with hydrodistention in the future.  Today Flow was much better.  She does not hesitate much.  She does not have discomfort that was associated when she could not urinate.  Clinically not infected.  I did send a urine for culture because last week once she had some pain.  Frequency is stable.  She had no blood in the urine     PMH: Past  Medical History:  Diagnosis Date  . Allergy   . Arthritis   . Chronic insomnia   . Colon cancer ([place])    polyp was cancerous  . Diabetes mellitus without complication ([place])   . GERD (gastroesophageal reflux disease)   . Hx of colonic polyps   . Hyperlipidemia   . Hypertension   . Neuromuscular disorder ([place])   . Psoriasis    scalp    Surgical History: Past Surgical History:  Procedure Laterality Date  . ABDOMINAL HYSTERECTOMY     total  . CERVICAL FUSION  2005  . CHOLECYSTECTOMY    . 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"  . HAND SURGERY Bilateral 2001  . POLYPECTOMY  2000  . rigger finger      Home Medications:  Allergies as of 09/29/2017      Reactions   Azithromycin Swelling   Propoxyphene Anaphylaxis   Tramadol Anaphylaxis   Penicillins Swelling   Piroxicam Rash   Sertraline Hcl Rash      Medication List        Accurate as of 09/29/17  2:16 PM. Always use your most recent med list.          ACCU-CHEK AVIVA PLUS w/Device Kit use to TEST BLOOD SUGAR   accu-chek multiclix lancets TEST BLOOD SUGAR daily or as directed by prescriber   albuterol 108 (90 Base) MCG/ACT inhaler Commonly known as:  PROVENTIL HFA;VENTOLIN HFA  Inhale 2 puffs into the lungs every 6 (six) hours as needed for wheezing or shortness of breath.   amLODipine 5 MG tablet Commonly known as:  NORVASC take 1 tablet by mouth once daily   atorvastatin 40 MG tablet Commonly known as:  LIPITOR take 1 tablet by mouth once daily   chlorthalidone 25 MG tablet Commonly known as:  HYGROTON take 1 tablet by mouth once daily   DULoxetine 60 MG capsule Commonly known as:  CYMBALTA Take 1 capsule (60 mg total) by mouth daily.   gabapentin 300 MG capsule Commonly known as:  NEURONTIN Take 1 capsule (300 mg total) by mouth at bedtime.   glucose blood test strip   meloxicam 15 MG tablet Commonly known as:  MOBIC Take 1 tablet (15 mg total) by mouth daily.   methocarbamol 750  MG tablet Commonly known as:  ROBAXIN take 1 tablet by mouth twice a day if needed   metoprolol tartrate 50 MG tablet Commonly known as:  LOPRESSOR Take 1 tablet (50 mg total) by mouth 2 (two) times daily.   neomycin-polymyxin b-dexamethasone 3.5-10000-0.1 Susp Commonly known as:  MAXITROL Place 2 drops into both eyes every 6 (six) hours.   nitroGLYCERIN 0.4 MG SL tablet Commonly known as:  NITROSTAT Place under the tongue.   potassium chloride SA 20 MEQ tablet Commonly known as:  K-DUR,KLOR-CON take 1 tablet by mouth once daily   tamsulosin 0.4 MG Caps capsule Commonly known as:  FLOMAX Take 1 capsule (0.4 mg total) by mouth daily.   traZODone 50 MG tablet Commonly known as:  DESYREL take 1 tablet by mouth at bedtime       Allergies:  Allergies  Allergen Reactions  . Azithromycin Swelling  . Propoxyphene Anaphylaxis  . Tramadol Anaphylaxis  . Penicillins Swelling  . Piroxicam Rash  . Sertraline Hcl Rash    Family History: Family History  Problem Relation Age of Onset  . Breast cancer Mother 8  . Diabetes Mother   . CAD Father   . Heart attack Father   . Diabetes Daughter   . Kidney failure Daughter   . Heart attack Sister   . Breast cancer Sister 8  . Sickle cell anemia Other   . Kidney cancer Neg Hx   . Bladder Cancer Neg Hx     Social History:  reports that she has been smoking cigarettes.  She has a 15.00 pack-year smoking history. she has never used smokeless tobacco. She reports that she drinks about 2.4 oz of alcohol per week. She reports that she does not use drugs.  ROS: UROLOGY Frequent Urination?: No Hard to postpone urination?: No Burning/pain with urination?: Yes Get up at night to urinate?: Yes Leakage of urine?: No Urine stream starts and stops?: No Trouble starting stream?: No Do you have to strain to urinate?: No Blood in urine?: No Urinary tract infection?: No Sexually transmitted disease?: No Injury to kidneys or bladder?:  No Painful intercourse?: No Weak stream?: No Currently pregnant?: No Vaginal bleeding?: No Last menstrual period?: n  Gastrointestinal Nausea?: No Vomiting?: No Indigestion/heartburn?: No Diarrhea?: No Constipation?: No  Constitutional Fever: No Night sweats?: No Weight loss?: No Fatigue?: Yes  Skin Skin rash/lesions?: No Itching?: No  Eyes Blurred vision?: No Double vision?: Yes  Ears/Nose/Throat Sore throat?: No Sinus problems?: No  Hematologic/Lymphatic Swollen glands?: No Easy bruising?: No  Cardiovascular Leg swelling?: No Chest pain?: No  Respiratory Cough?: No Shortness of breath?: No  Endocrine Excessive thirst?: No  Musculoskeletal Back pain?: Yes Joint pain?: Yes  Neurological Headaches?: No Dizziness?: Yes  Psychologic Depression?: Yes Anxiety?: Yes  Physical Exam: There were no vitals taken for this visit.  Constitutional:  Alert and oriented, No acute distress.  Laboratory Data: Lab Results  Component Value Date   WBC 7.5 04/28/2017   HGB 13.4 04/28/2017   HCT 39.6 04/28/2017   MCV 91 04/28/2017   PLT 251 04/28/2017    Lab Results  Component Value Date   CREATININE 1.19 (H) 08/29/2017    No results found for: PSA  No results found for: TESTOSTERONE  Lab Results  Component Value Date   HGBA1C 5.7 (H) 08/29/2017    Urinalysis    Component Value Date/Time   COLORURINE Straw 07/06/2013 1813   APPEARANCEUR Clear 05/21/2017 1327   LABSPEC 1.025 07/06/2013 1813   PHURINE 7.0 07/06/2013 1813   GLUCOSEU Negative 05/21/2017 1327   GLUCOSEU Negative 07/06/2013 1813   HGBUR 1+ 07/06/2013 1813   BILIRUBINUR Negative 05/21/2017 1327   BILIRUBINUR Negative 07/06/2013 1813   KETONESUR Negative 07/06/2013 1813   PROTEINUR Negative 05/21/2017 1327   PROTEINUR Negative 07/06/2013 1813   UROBILINOGEN 0.2 04/28/2017 1141   NITRITE Negative 05/21/2017 1327   NITRITE Negative 07/06/2013 1813   LEUKOCYTESUR Negative  05/21/2017 1327   LEUKOCYTESUR Negative 07/06/2013 1813    Pertinent Imaging: none  Assessment & Plan: Reassess in 3 months on Flomax.  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  At this stage I did not think she needed a cystoscopy.  She does smoke but again she has never had blood in the urine  1. Urgency of urination  - Urinalysis, Complete - CULTURE, URINE COMPREHENSIVE   Return in about 3 months (around 12/30/2017).  Reece Packer, MD  [facility] [address] [place], [place] [address] [phone]

## 2017-09-30 ENCOUNTER — Other Ambulatory Visit: Payer: Self-pay | Admitting: Internal Medicine

## 2017-10-06 LAB — CULTURE, URINE COMPREHENSIVE

## 2017-10-07 ENCOUNTER — Telehealth: Payer: Self-pay

## 2017-10-07 MED ORDER — NITROFURANTOIN MACROCRYSTAL 100 MG PO CAPS
100.0000 mg | ORAL_CAPSULE | Freq: Two times a day (BID) | ORAL | 0 refills | Status: DC
Start: 2017-10-07 — End: 2018-01-07

## 2017-10-07 NOTE — Telephone Encounter (Signed)
Bjorn Loser, MD  Toniann Fail C, LPN        Please check to see if I treated this already  If not macrodantin 100 mg bid for 7 days    Spoke with pt in reference to +ucx and needing to take abx. Made aware abx were sent to pharmacy. Pt voiced understanding.

## 2017-10-20 ENCOUNTER — Other Ambulatory Visit: Payer: Self-pay | Admitting: Internal Medicine

## 2017-10-20 DIAGNOSIS — R7309 Other abnormal glucose: Secondary | ICD-10-CM

## 2017-10-20 MED ORDER — ACCU-CHEK MULTICLIX LANCETS MISC: 1.0000 | Freq: Two times a day (BID) | 1 refills | Status: DC

## 2017-10-20 MED ORDER — GLUCOSE BLOOD VI STRP: 1.0000 | ORAL_STRIP | Freq: Two times a day (BID) | 1 refills | Status: DC

## 2017-12-30 ENCOUNTER — Ambulatory Visit: Payer: Medicare HMO | Admitting: Internal Medicine

## 2018-01-05 ENCOUNTER — Encounter: Payer: Self-pay | Admitting: Urology

## 2018-01-05 ENCOUNTER — Ambulatory Visit (INDEPENDENT_AMBULATORY_CARE_PROVIDER_SITE_OTHER): Payer: Medicare HMO | Admitting: Urology

## 2018-01-05 VITALS — BP 134/88 | HR 77 | Ht 61.0 in | Wt 139.0 lb

## 2018-01-05 DIAGNOSIS — R3915 Urgency of urination: Secondary | ICD-10-CM

## 2018-01-05 NOTE — Progress Notes (Signed)
01/05/2018 2:50 PM   Betty Vaughn Nov 11, 1951 [id number]  Referring provider: Glean Hess, MD [address] [place], [place] [address]  No chief complaint on file.   HPI: Betty Vaughn:   She states that she has been having issues with urgency and feelings of incomplete emptying for over the four years. It has been getting worse over the last few months. She is having to strain to get the urine out and feels a pulling sensation in her vagina. She has nocturia x 3 -4, hesitancy and a weak urinary stream. Patient states that she has not had urinary tract infections over the last year.She admits to constipation. She does not have incontinence.   She failed the beta 3 agonists and her residual was 0 mL  Frequency and flow symptoms are stable. On urodynamics the patient emptied efficiently. Bladder capacity was 500 mL. Bladder was unstable reach a pressure of 9 cm water. She did not leak. She did not leak with a Valsalva pressure of 80 cm of water. During voiding she voided 500 mL with a maximum 25 mils per second. Maximum voiding pressure was 44 cm water and she emptied efficiently. EMG activity increased during the voiding phase. Bladder neck descent 1 cm. She did experience urgency with the instability. She did not complain of bladder pain.    The patient does hesitate before urination. She can stop and start. She does not always feel empty. Clinically not infected. She has had a pulling sensation in the suprapubic area especially with voiding. I think she's had chronic pain issues perhaps.  Reassess in about 5 weeks on Flomax and if her symptoms are not dramatically better she understands I will perform a cystoscopy then. She may or may not need a CT scan or cystoscopy with hydrodistention in the future.  Today Last urine culture was positive.  Frequency is stable  It was difficult to put the history together today and the patient cannot remember taking Flomax.  By  history I think her symptoms actually got better but now they are starting again.  She had some flow symptoms this morning where she would void a small amount and it was hard to start.  She said the urine is darker and there is almost an uncomfortable feeling  Modifying factors: There are no other modifying factors  Associated signs and symptoms: There are no other associated signs and symptoms Aggravating and relieving factors: There are no other aggravating or relieving factors Severity: Moderate Duration: Persistent   PMH: Past Medical History:  Diagnosis Date  . Allergy   . Arthritis   . Chronic insomnia   . Colon cancer ([place])    polyp was cancerous  . Diabetes mellitus without complication ([place])   . GERD (gastroesophageal reflux disease)   . Hx of colonic polyps   . Hyperlipidemia   . Hypertension   . Neuromuscular disorder ([place])   . Psoriasis    scalp    Surgical History: Past Surgical History:  Procedure Laterality Date  . ABDOMINAL HYSTERECTOMY     total  . CERVICAL FUSION  2005  . CHOLECYSTECTOMY    . COLONOSCOPY WITH PROPOFOL N/A 06/12/2017   Procedure: COLONOSCOPY WITH PROPOFOL;  Surgeon: Lucilla Lame, MD;  Location: [facility];  Service: Gastroenterology;  Laterality: N/A;  . 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"  . HAND SURGERY Bilateral 2001  . POLYPECTOMY  2000  . POLYPECTOMY N/A 06/12/2017   Procedure: POLYPECTOMY;  Surgeon: Allen Norris,  Darren, MD;  Location: [facility];  Service: Gastroenterology;  Laterality: N/A;  . rigger finger      Home Medications:  Allergies as of 01/05/2018      Reactions   Azithromycin Swelling   Propoxyphene Anaphylaxis   Tramadol Anaphylaxis   Penicillins Swelling   Piroxicam Rash   Sertraline Hcl Rash      Medication List        Accurate as of 01/05/18  2:50 PM. Always use your most recent med list.          ACCU-CHEK AVIVA PLUS w/Device Kit use to TEST BLOOD SUGAR   accu-chek multiclix  lancets 1 each by Other route 2 (two) times daily. Use as instructed   albuterol 108 (90 Base) MCG/ACT inhaler Commonly known as:  PROVENTIL HFA;VENTOLIN HFA Inhale 2 puffs into the lungs every 6 (six) hours as needed for wheezing or shortness of breath.   amLODipine 5 MG tablet Commonly known as:  NORVASC take 1 tablet by mouth once daily   atorvastatin 40 MG tablet Commonly known as:  LIPITOR take 1 tablet by mouth once daily   chlorthalidone 25 MG tablet Commonly known as:  HYGROTON take 1 tablet by mouth once daily   DULoxetine 60 MG capsule Commonly known as:  CYMBALTA take 1 capsule by mouth once daily   gabapentin 300 MG capsule Commonly known as:  NEURONTIN take 1 capsule by mouth at bedtime   glucose blood test strip Commonly known as:  ACCU-CHEK AVIVA 1 each by Other route 2 (two) times daily. Use as instructed   meloxicam 15 MG tablet Commonly known as:  MOBIC take 1 tablet by mouth once daily   methocarbamol 750 MG tablet Commonly known as:  ROBAXIN take 1 tablet by mouth twice a day if needed   metoprolol tartrate 50 MG tablet Commonly known as:  LOPRESSOR take 1 tablet by mouth twice a day   nitrofurantoin 100 MG capsule Commonly known as:  MACRODANTIN Take 1 capsule (100 mg total) 2 (two) times daily by mouth.   nitroGLYCERIN 0.4 MG SL tablet Commonly known as:  NITROSTAT Place under the tongue.   potassium chloride SA 20 MEQ tablet Commonly known as:  K-DUR,KLOR-CON take 1 tablet by mouth once daily   tamsulosin 0.4 MG Caps capsule Commonly known as:  FLOMAX Take 1 capsule (0.4 mg total) by mouth daily.   traZODone 50 MG tablet Commonly known as:  DESYREL take 1 tablet by mouth at bedtime       Allergies:  Allergies  Allergen Reactions  . Azithromycin Swelling  . Propoxyphene Anaphylaxis  . Tramadol Anaphylaxis  . Penicillins Swelling  . Piroxicam Rash  . Sertraline Hcl Rash    Family History: Family History  Problem  Relation Age of Onset  . Breast cancer Mother 42  . Diabetes Mother   . CAD Father   . Heart attack Father   . Diabetes Daughter   . Kidney failure Daughter   . Heart attack Sister   . Breast cancer Sister 4  . Sickle cell anemia Other   . Kidney cancer Neg Hx   . Bladder Cancer Neg Hx     Social History:  reports that she has been smoking cigarettes.  She has a 15.00 pack-year smoking history. she has never used smokeless tobacco. She reports that she drinks about 2.4 oz of alcohol per week. She reports that she does not use drugs.  ROS:  Physical Exam: There were no vitals taken for this visit.  Constitutional:  Alert and oriented, No acute distress. HEENT: [place] AT, moist mucus membranes.  Trachea midline, no masses.   Laboratory Data: Lab Results  Component Value Date   WBC 7.5 04/28/2017   HGB 13.4 04/28/2017   HCT 39.6 04/28/2017   MCV 91 04/28/2017   PLT 251 04/28/2017    Lab Results  Component Value Date   CREATININE 1.19 (H) 08/29/2017    No results found for: PSA  No results found for: TESTOSTERONE  Lab Results  Component Value Date   HGBA1C 5.7 (H) 08/29/2017    Urinalysis    Component Value Date/Time   COLORURINE Straw 07/06/2013 1813   APPEARANCEUR Clear 09/29/2017 1409   LABSPEC 1.025 07/06/2013 1813   PHURINE 7.0 07/06/2013 1813   GLUCOSEU Negative 09/29/2017 1409   GLUCOSEU Negative 07/06/2013 1813   HGBUR 1+ 07/06/2013 1813   BILIRUBINUR Negative 09/29/2017 1409   BILIRUBINUR Negative 07/06/2013 1813   KETONESUR Negative 07/06/2013 1813   PROTEINUR Negative 09/29/2017 1409   PROTEINUR Negative 07/06/2013 1813   UROBILINOGEN 0.2 04/28/2017 1141   NITRITE Negative 09/29/2017 1409   NITRITE Negative 07/06/2013 1813   LEUKOCYTESUR Trace (A) 09/29/2017 1409   LEUKOCYTESUR Negative 07/06/2013 1813    Pertinent Imaging: none  Assessment & Plan: The patient might be having  recurrent bladder infections.  Her symptoms are quite nonspecific so I think I should assume this.  I sent the urine for culture.  I will get a renal ultrasound and have her come back for cystoscopy.  The patient thinks she is been getting infections as well.  We will proceed accordingly  There are no diagnoses linked to this encounter.  No Follow-up on file.  Reece Packer, MD  [facility] [address], [place], [place] [address] [phone]

## 2018-01-06 LAB — MICROSCOPIC EXAMINATION
BACTERIA UA: NONE SEEN
RBC MICROSCOPIC, UA: NONE SEEN /HPF (ref 0–?)

## 2018-01-06 LAB — URINALYSIS, COMPLETE
Bilirubin, UA: NEGATIVE
Glucose, UA: NEGATIVE
KETONES UA: NEGATIVE
Nitrite, UA: NEGATIVE
PROTEIN UA: NEGATIVE
RBC, UA: NEGATIVE
SPEC GRAV UA: 1.015 (ref 1.005–1.030)
Urobilinogen, Ur: 0.2 mg/dL (ref 0.2–1.0)
pH, UA: 7 (ref 5.0–7.5)

## 2018-01-07 ENCOUNTER — Other Ambulatory Visit: Payer: Self-pay | Admitting: Internal Medicine

## 2018-01-07 ENCOUNTER — Encounter: Payer: Self-pay | Admitting: Internal Medicine

## 2018-01-07 ENCOUNTER — Ambulatory Visit (INDEPENDENT_AMBULATORY_CARE_PROVIDER_SITE_OTHER): Payer: Medicare HMO | Admitting: Internal Medicine

## 2018-01-07 VITALS — BP 124/68 | HR 74 | Ht 61.0 in | Wt 139.0 lb

## 2018-01-07 DIAGNOSIS — M5000 Cervical disc disorder with myelopathy, unspecified cervical region: Secondary | ICD-10-CM

## 2018-01-07 DIAGNOSIS — I10 Essential (primary) hypertension: Secondary | ICD-10-CM

## 2018-01-07 DIAGNOSIS — M5116 Intervertebral disc disorders with radiculopathy, lumbar region: Secondary | ICD-10-CM | POA: Diagnosis not present

## 2018-01-07 DIAGNOSIS — R7302 Impaired glucose tolerance (oral): Secondary | ICD-10-CM | POA: Diagnosis not present

## 2018-01-07 DIAGNOSIS — F3341 Major depressive disorder, recurrent, in partial remission: Secondary | ICD-10-CM

## 2018-01-07 LAB — CULTURE, URINE COMPREHENSIVE

## 2018-01-07 NOTE — Progress Notes (Signed)
Date:  01/07/2018   Name:  Betty Vaughn   DOB:  08-16-1951   MRN:  [id number]   Chief Complaint: Hypertension; Diabetes; and Leg Pain (Noticing leg trying to turn in. Pain and aching for about a month in hip and radiating down into leg. ) Hypertension  This is a chronic problem. The problem is unchanged. The problem is controlled. Pertinent negatives include no chest pain, headaches or shortness of breath. Past treatments include calcium channel blockers and beta blockers.  Diabetes  She presents for her follow-up (glucose intolerance) diabetic visit. Her disease course has been stable. There are no hypoglycemic associated symptoms. Pertinent negatives for hypoglycemia include no dizziness or headaches. Associated symptoms include weakness. Pertinent negatives for diabetes include no chest pain and no fatigue. Current diabetic treatment includes diet. She is compliant with treatment most of the time. An ACE inhibitor/angiotensin II receptor blocker is not being taken.  Depression         This is a chronic problem.The problem is unchanged.  Associated symptoms include myalgias.  Associated symptoms include no fatigue and no headaches.  Past treatments include SNRIs - Serotonin and norepinephrine reuptake inhibitors.  Compliance with treatment is good.  Previous treatment provided significant relief. Back Pain  This is a new problem. The current episode started more than 1 month ago. The problem occurs constantly. The problem has been gradually worsening since onset. The pain is present in the lumbar spine. The quality of the pain is described as aching. The pain radiates to the right foot. The pain is mild. Associated symptoms include numbness and weakness. Pertinent negatives include no bladder incontinence, bowel incontinence, chest pain, fever, headaches or perianal numbness. Risk factors: hx of MRI lumbar showing mild disc bulge at several levels. She has tried NSAIDs (and gabapentin) for the  symptoms. The treatment provided no relief.   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  She is s/p cervical fusion C3-6 in 2006.   Review of Systems  Constitutional: Negative for chills, fatigue and fever.  Respiratory: Negative for chest tightness and shortness of breath.   Cardiovascular: Negative for chest pain and leg swelling.  Gastrointestinal: Negative for bowel incontinence.  Genitourinary: Positive for difficulty urinating. Negative for bladder incontinence.  Musculoskeletal: Positive for back pain, myalgias and neck stiffness.  Neurological: Positive for weakness and numbness. Negative for dizziness and headaches.  Psychiatric/Behavioral: Positive for depression.    Patient Active Problem List   Diagnosis Date Noted  . Personal history of colonic polyps   . Rectal polyp   . Tobacco use disorder 11/20/2016  . Neuropathy 04/10/2016  . Glucose intolerance (impaired glucose tolerance) 05/13/2015  . Benign essential tremor 05/13/2015  . Chronic insomnia 05/13/2015  . 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 Multiple joint complaints 05/13/2015  . Depression, major, recurrent, in partial remission ([place]) 02/23/2014  . Acid reflux 02/23/2014  . Essential hypertension 02/23/2014  . Hyperlipidemia, unspecified 02/23/2014  . Facet arthritis of lumbar region ([place]) 01/16/2012  . Chronic headache disorder 01/16/2012  . Syrinx of spinal cord ([place]) 01/16/2012  . 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 Pseudoarthrosis of cervical spine ([place]) 11/05/2011  . CAD S/P percutaneous coronary angioplasty 03/16/2010    Prior to Admission medications   Medication Sig Start Date End Date Taking? Authorizing Provider  albuterol (PROVENTIL HFA;VENTOLIN HFA) 108 (90 Base) MCG/ACT inhaler  Inhale 2 puffs into the lungs every 6 (six) hours as needed for wheezing or shortness of breath. 11/10/16    Orbie Pyo, MD  amLODipine (NORVASC) 5 MG tablet take 1 tablet by mouth once daily 09/30/17   Glean Hess, MD  atorvastatin (LIPITOR) 40 MG tablet take 1 tablet by mouth once daily 09/30/17   Glean Hess, MD  Blood Glucose Monitoring Suppl (ACCU-CHEK AVIVA PLUS) W/DEVICE KIT use to TEST BLOOD SUGAR 06/21/15   Glean Hess, MD  chlorthalidone (HYGROTON) 25 MG tablet take 1 tablet by mouth once daily 09/30/17   Glean Hess, MD  DULoxetine (CYMBALTA) 60 MG capsule take 1 capsule by mouth once daily 09/30/17   Glean Hess, MD  gabapentin (NEURONTIN) 300 MG capsule take 1 capsule by mouth at bedtime 09/30/17   Glean Hess, MD  glucose blood (ACCU-CHEK AVIVA) test strip 1 each by Other route 2 (two) times daily. Use as instructed 10/20/17   Glean Hess, MD  Lancets (ACCU-CHEK MULTICLIX) lancets 1 each by Other route 2 (two) times daily. Use as instructed 10/20/17   Glean Hess, MD  meloxicam [facility]) 15 MG tablet take 1 tablet by mouth once daily 09/30/17   Glean Hess, MD  methocarbamol (ROBAXIN) 750 MG tablet take 1 tablet by mouth twice a day if needed 01/23/17   Glean Hess, MD  metoprolol tartrate (LOPRESSOR) 50 MG tablet take 1 tablet by mouth twice a day 09/30/17   Glean Hess, MD  nitroGLYCERIN (NITROSTAT) 0.4 MG SL tablet Place under the tongue.    [provider]  potassium chloride SA (K-DUR,KLOR-CON) 20 MEQ tablet take 1 tablet by mouth once daily 09/30/17   Glean Hess, MD  tamsulosin (FLOMAX) 0.4 MG CAPS capsule Take 1 capsule (0.4 mg total) by mouth daily. 08/26/17   Bjorn Loser, MD  traZODone (DESYREL) 50 MG tablet take 1 tablet by mouth at bedtime 09/30/17   Glean Hess, MD    Allergies  Allergen Reactions  . Azithromycin Swelling  . Propoxyphene Anaphylaxis  . Tramadol Anaphylaxis  . Penicillins Swelling  . Piroxicam Rash  . Sertraline Hcl Rash    Past Surgical History:  Procedure  Laterality Date  . ABDOMINAL HYSTERECTOMY     total  . CERVICAL FUSION  2005  . CHOLECYSTECTOMY    . COLONOSCOPY WITH PROPOFOL N/A 06/12/2017   Procedure: COLONOSCOPY WITH PROPOFOL;  Surgeon: Lucilla Lame, MD;  Location: [facility];  Service: Gastroenterology;  Laterality: N/A;  . 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"  . HAND SURGERY Bilateral 2001  . POLYPECTOMY  2000  . POLYPECTOMY N/A 06/12/2017   Procedure: POLYPECTOMY;  Surgeon: Lucilla Lame, MD;  Location: [facility];  Service: Gastroenterology;  Laterality: N/A;  . rigger finger      Social History   Tobacco Use  . Smoking status: Current Every Day Smoker    Packs/day: 0.50    Years: 30.00    Pack years: 15.00    Types: Cigarettes  . Smokeless tobacco: Never Used  Substance Use Topics  . Alcohol use: Yes    Alcohol/week: 2.4 oz    Types: 4 Standard drinks or equivalent per week  . Drug use: No     Medication list has been reviewed and updated.  PHQ 2/9 Scores 01/07/2018 08/29/2017 04/28/2017 04/10/2016  PHQ - 2 Score 0 6 0 0  PHQ- 9 Score 0 14 - -  Physical Exam  Constitutional: She is oriented to person, place, and time. She appears well-developed. No distress.  HENT:  Head: Normocephalic and atraumatic.  Neck: Spinous process tenderness and muscular tenderness present. No edema present. No thyromegaly present.  Cardiovascular: Normal rate, regular rhythm and normal heart sounds.  Pulmonary/Chest: Effort normal and breath sounds normal. No respiratory distress.  Musculoskeletal: Normal range of motion.       Cervical back: She exhibits spasm.       Lumbar back: She exhibits bony tenderness.  Neurological: She is alert and oriented to person, place, and time. A sensory deficit is present. Gait (left sided foot drop noted) abnormal.  Reflex Scores:      Bicep reflexes are 2+ on the right side and 2+ on the left side.      Patellar reflexes are 2+ on the right side and 4+ on the left  side. Grip 3+/5 RUE RLE dorsiflexion 4/5 RLE quad 4/5 LLE dorsiflexion 3-/5 LLE quad strength 3/5  Skin: Skin is warm and dry. No rash noted.  Psychiatric: She has a normal mood and affect. Her behavior is normal. Thought content normal.  Nursing note and vitals reviewed.   BP 124/68   Pulse 74   Ht 5' 1"  (1.549 m)   Wt 139 lb (63 kg)   SpO2 98%   BMI 26.26 kg/m   Assessment and Plan: 1. Essential hypertension controlled - Basic metabolic panel  2. Lumbar disc herniation with radiculopathy Left foot drop and urinary retention indicate need for imaging Continue meloxicam and gabapentin - MR Lumbar Spine Wo Contrast; Future  3. Cervical disc disease with myelopathy Hx of fusion with RUE recurrent sx - MR CERVICAL SPINE W CONTRAST; Future  4. Glucose intolerance (impaired glucose tolerance) - Hemoglobin A1c  5. Depression, major, recurrent, in partial remission ([place]) Doing well on medication   No orders of the defined types were placed in this encounter.   Partially dictated using Editor, commissioning. Any errors are unintentional.  Halina Maidens, MD [facility]  01/07/2018

## 2018-01-08 LAB — BASIC METABOLIC PANEL
BUN / CREAT RATIO: 20 (ref 12–28)
BUN: 23 mg/dL (ref 8–27)
CO2: 24 mmol/L (ref 20–29)
CREATININE: 1.16 mg/dL — AB (ref 0.57–1.00)
Calcium: 9.6 mg/dL (ref 8.7–10.3)
Chloride: 98 mmol/L (ref 96–106)
GFR, EST AFRICAN AMERICAN: 57 mL/min/{1.73_m2} — AB (ref >59)
GFR, EST NON AFRICAN AMERICAN: 49 mL/min/{1.73_m2} — AB (ref >59)
Glucose: 82 mg/dL (ref 65–99)
Potassium: 4 mmol/L (ref 3.5–5.2)
Sodium: 140 mmol/L (ref 134–144)

## 2018-01-08 LAB — HEMOGLOBIN A1C
Est. average glucose Bld gHb Est-mCnc: 120 mg/dL
Hgb A1c MFr Bld: 5.8 % — ABNORMAL HIGH (ref 4.8–5.6)

## 2018-01-14 ENCOUNTER — Ambulatory Visit
Admission: RE | Admit: 2018-01-14 | Discharge: 2018-01-14 | Disposition: A | Discharge status: Home / Self Care | Payer: Medicare HMO | Source: Ambulatory Visit | Attending: Internal Medicine | Admitting: Internal Medicine

## 2018-01-14 ENCOUNTER — Other Ambulatory Visit: Payer: Self-pay | Admitting: Internal Medicine

## 2018-01-14 DIAGNOSIS — M48061 Spinal stenosis, lumbar region without neurogenic claudication: Secondary | ICD-10-CM | POA: Diagnosis not present

## 2018-01-14 DIAGNOSIS — M2578 Osteophyte, vertebrae: Secondary | ICD-10-CM | POA: Insufficient documentation

## 2018-01-14 DIAGNOSIS — M5000 Cervical disc disorder with myelopathy, unspecified cervical region: Secondary | ICD-10-CM | POA: Insufficient documentation

## 2018-01-14 DIAGNOSIS — M5126 Other intervertebral disc displacement, lumbar region: Secondary | ICD-10-CM | POA: Insufficient documentation

## 2018-01-14 DIAGNOSIS — M5116 Intervertebral disc disorders with radiculopathy, lumbar region: Secondary | ICD-10-CM

## 2018-01-14 DIAGNOSIS — M4802 Spinal stenosis, cervical region: Secondary | ICD-10-CM

## 2018-01-14 DIAGNOSIS — M4322 Fusion of spine, cervical region: Secondary | ICD-10-CM | POA: Insufficient documentation

## 2018-01-14 DIAGNOSIS — M545 Low back pain: Secondary | ICD-10-CM | POA: Diagnosis not present

## 2018-01-21 DIAGNOSIS — E119 Type 2 diabetes mellitus without complications: Secondary | ICD-10-CM | POA: Diagnosis not present

## 2018-01-21 LAB — HM DIABETES EYE EXAM

## 2018-01-23 DIAGNOSIS — M4712 Other spondylosis with myelopathy, cervical region: Secondary | ICD-10-CM | POA: Diagnosis not present

## 2018-01-23 DIAGNOSIS — Z981 Arthrodesis status: Secondary | ICD-10-CM | POA: Diagnosis not present

## 2018-01-23 HISTORY — PX: CERVICAL FUSION: SHX112

## 2018-01-26 ENCOUNTER — Encounter: Payer: Self-pay | Admitting: Internal Medicine

## 2018-01-28 ENCOUNTER — Ambulatory Visit: Payer: Medicare HMO

## 2018-01-28 DIAGNOSIS — Z87891 Personal history of nicotine dependence: Secondary | ICD-10-CM | POA: Diagnosis not present

## 2018-01-28 DIAGNOSIS — J384 Edema of larynx: Secondary | ICD-10-CM | POA: Diagnosis not present

## 2018-01-28 DIAGNOSIS — R1314 Dysphagia, pharyngoesophageal phase: Secondary | ICD-10-CM | POA: Diagnosis not present

## 2018-01-28 DIAGNOSIS — M5090 Cervical disc disorder, unspecified, unspecified cervical region: Secondary | ICD-10-CM | POA: Diagnosis not present

## 2018-01-29 DIAGNOSIS — Z01818 Encounter for other preprocedural examination: Secondary | ICD-10-CM | POA: Diagnosis not present

## 2018-01-29 DIAGNOSIS — Z01810 Encounter for preprocedural cardiovascular examination: Secondary | ICD-10-CM | POA: Diagnosis not present

## 2018-01-29 DIAGNOSIS — I10 Essential (primary) hypertension: Secondary | ICD-10-CM | POA: Diagnosis not present

## 2018-01-29 DIAGNOSIS — R0609 Other forms of dyspnea: Secondary | ICD-10-CM | POA: Diagnosis not present

## 2018-01-29 DIAGNOSIS — Z9889 Other specified postprocedural states: Secondary | ICD-10-CM | POA: Diagnosis not present

## 2018-01-29 DIAGNOSIS — E7800 Pure hypercholesterolemia, unspecified: Secondary | ICD-10-CM | POA: Diagnosis not present

## 2018-01-30 DIAGNOSIS — N183 Chronic kidney disease, stage 3 (moderate): Secondary | ICD-10-CM | POA: Diagnosis not present

## 2018-01-30 DIAGNOSIS — I129 Hypertensive chronic kidney disease with stage 1 through stage 4 chronic kidney disease, or unspecified chronic kidney disease: Secondary | ICD-10-CM | POA: Diagnosis not present

## 2018-01-30 DIAGNOSIS — I708 Atherosclerosis of other arteries: Secondary | ICD-10-CM | POA: Diagnosis not present

## 2018-01-30 DIAGNOSIS — M960 Pseudarthrosis after fusion or arthrodesis: Secondary | ICD-10-CM | POA: Diagnosis not present

## 2018-01-30 DIAGNOSIS — R1312 Dysphagia, oropharyngeal phase: Secondary | ICD-10-CM | POA: Diagnosis not present

## 2018-01-30 DIAGNOSIS — F17200 Nicotine dependence, unspecified, uncomplicated: Secondary | ICD-10-CM | POA: Diagnosis not present

## 2018-01-30 DIAGNOSIS — Z4689 Encounter for fitting and adjustment of other specified devices: Secondary | ICD-10-CM | POA: Diagnosis not present

## 2018-01-30 DIAGNOSIS — M50822 Other cervical disc disorders at C5-C6 level: Secondary | ICD-10-CM | POA: Diagnosis not present

## 2018-01-30 DIAGNOSIS — I2510 Atherosclerotic heart disease of native coronary artery without angina pectoris: Secondary | ICD-10-CM | POA: Diagnosis not present

## 2018-01-30 DIAGNOSIS — M5412 Radiculopathy, cervical region: Secondary | ICD-10-CM | POA: Diagnosis not present

## 2018-01-30 DIAGNOSIS — Z9189 Other specified personal risk factors, not elsewhere classified: Secondary | ICD-10-CM | POA: Diagnosis not present

## 2018-01-30 DIAGNOSIS — Z01818 Encounter for other preprocedural examination: Secondary | ICD-10-CM | POA: Diagnosis not present

## 2018-01-30 DIAGNOSIS — R7303 Prediabetes: Secondary | ICD-10-CM | POA: Diagnosis not present

## 2018-01-30 DIAGNOSIS — I10 Essential (primary) hypertension: Secondary | ICD-10-CM | POA: Diagnosis not present

## 2018-01-30 DIAGNOSIS — N390 Urinary tract infection, site not specified: Secondary | ICD-10-CM | POA: Diagnosis not present

## 2018-01-30 DIAGNOSIS — F329 Major depressive disorder, single episode, unspecified: Secondary | ICD-10-CM | POA: Diagnosis not present

## 2018-01-30 DIAGNOSIS — F17210 Nicotine dependence, cigarettes, uncomplicated: Secondary | ICD-10-CM | POA: Diagnosis not present

## 2018-01-30 DIAGNOSIS — M961 Postlaminectomy syndrome, not elsewhere classified: Secondary | ICD-10-CM | POA: Diagnosis not present

## 2018-01-30 DIAGNOSIS — M4322 Fusion of spine, cervical region: Secondary | ICD-10-CM | POA: Diagnosis not present

## 2018-01-30 DIAGNOSIS — G250 Essential tremor: Secondary | ICD-10-CM | POA: Diagnosis not present

## 2018-01-30 DIAGNOSIS — K219 Gastro-esophageal reflux disease without esophagitis: Secondary | ICD-10-CM | POA: Diagnosis not present

## 2018-01-30 DIAGNOSIS — E7800 Pure hypercholesterolemia, unspecified: Secondary | ICD-10-CM | POA: Diagnosis not present

## 2018-01-30 DIAGNOSIS — M4802 Spinal stenosis, cervical region: Secondary | ICD-10-CM | POA: Diagnosis not present

## 2018-01-30 DIAGNOSIS — G959 Disease of spinal cord, unspecified: Secondary | ICD-10-CM | POA: Diagnosis not present

## 2018-01-30 DIAGNOSIS — E785 Hyperlipidemia, unspecified: Secondary | ICD-10-CM | POA: Diagnosis not present

## 2018-02-02 ENCOUNTER — Other Ambulatory Visit: Payer: Medicare HMO

## 2018-02-08 DIAGNOSIS — Z4789 Encounter for other orthopedic aftercare: Secondary | ICD-10-CM | POA: Diagnosis not present

## 2018-02-08 DIAGNOSIS — I2510 Atherosclerotic heart disease of native coronary artery without angina pectoris: Secondary | ICD-10-CM | POA: Diagnosis not present

## 2018-02-08 DIAGNOSIS — M5412 Radiculopathy, cervical region: Secondary | ICD-10-CM | POA: Diagnosis not present

## 2018-02-08 DIAGNOSIS — I129 Hypertensive chronic kidney disease with stage 1 through stage 4 chronic kidney disease, or unspecified chronic kidney disease: Secondary | ICD-10-CM | POA: Diagnosis not present

## 2018-02-08 DIAGNOSIS — G9009 Other idiopathic peripheral autonomic neuropathy: Secondary | ICD-10-CM | POA: Diagnosis not present

## 2018-02-08 DIAGNOSIS — M4322 Fusion of spine, cervical region: Secondary | ICD-10-CM | POA: Diagnosis not present

## 2018-02-08 DIAGNOSIS — G4700 Insomnia, unspecified: Secondary | ICD-10-CM | POA: Diagnosis not present

## 2018-02-08 DIAGNOSIS — N183 Chronic kidney disease, stage 3 (moderate): Secondary | ICD-10-CM | POA: Diagnosis not present

## 2018-02-08 DIAGNOSIS — R7303 Prediabetes: Secondary | ICD-10-CM | POA: Diagnosis not present

## 2018-02-08 DIAGNOSIS — M1990 Unspecified osteoarthritis, unspecified site: Secondary | ICD-10-CM | POA: Diagnosis not present

## 2018-02-08 DIAGNOSIS — R1312 Dysphagia, oropharyngeal phase: Secondary | ICD-10-CM | POA: Diagnosis not present

## 2018-02-08 DIAGNOSIS — G959 Disease of spinal cord, unspecified: Secondary | ICD-10-CM | POA: Diagnosis not present

## 2018-02-10 DIAGNOSIS — M5412 Radiculopathy, cervical region: Secondary | ICD-10-CM | POA: Diagnosis not present

## 2018-02-10 DIAGNOSIS — Z4789 Encounter for other orthopedic aftercare: Secondary | ICD-10-CM | POA: Diagnosis not present

## 2018-02-10 DIAGNOSIS — R7303 Prediabetes: Secondary | ICD-10-CM | POA: Diagnosis not present

## 2018-02-10 DIAGNOSIS — G9009 Other idiopathic peripheral autonomic neuropathy: Secondary | ICD-10-CM | POA: Diagnosis not present

## 2018-02-10 DIAGNOSIS — I129 Hypertensive chronic kidney disease with stage 1 through stage 4 chronic kidney disease, or unspecified chronic kidney disease: Secondary | ICD-10-CM | POA: Diagnosis not present

## 2018-02-10 DIAGNOSIS — N183 Chronic kidney disease, stage 3 (moderate): Secondary | ICD-10-CM | POA: Diagnosis not present

## 2018-02-10 DIAGNOSIS — G4700 Insomnia, unspecified: Secondary | ICD-10-CM | POA: Diagnosis not present

## 2018-02-10 DIAGNOSIS — I2510 Atherosclerotic heart disease of native coronary artery without angina pectoris: Secondary | ICD-10-CM | POA: Diagnosis not present

## 2018-02-10 DIAGNOSIS — M1990 Unspecified osteoarthritis, unspecified site: Secondary | ICD-10-CM | POA: Diagnosis not present

## 2018-02-11 ENCOUNTER — Telehealth: Payer: Self-pay

## 2018-02-11 DIAGNOSIS — G9009 Other idiopathic peripheral autonomic neuropathy: Secondary | ICD-10-CM | POA: Diagnosis not present

## 2018-02-11 DIAGNOSIS — I129 Hypertensive chronic kidney disease with stage 1 through stage 4 chronic kidney disease, or unspecified chronic kidney disease: Secondary | ICD-10-CM | POA: Diagnosis not present

## 2018-02-11 DIAGNOSIS — Z4789 Encounter for other orthopedic aftercare: Secondary | ICD-10-CM | POA: Diagnosis not present

## 2018-02-11 DIAGNOSIS — G4700 Insomnia, unspecified: Secondary | ICD-10-CM | POA: Diagnosis not present

## 2018-02-11 DIAGNOSIS — I2510 Atherosclerotic heart disease of native coronary artery without angina pectoris: Secondary | ICD-10-CM | POA: Diagnosis not present

## 2018-02-11 DIAGNOSIS — M5412 Radiculopathy, cervical region: Secondary | ICD-10-CM | POA: Diagnosis not present

## 2018-02-11 DIAGNOSIS — N183 Chronic kidney disease, stage 3 (moderate): Secondary | ICD-10-CM | POA: Diagnosis not present

## 2018-02-11 DIAGNOSIS — M1990 Unspecified osteoarthritis, unspecified site: Secondary | ICD-10-CM | POA: Diagnosis not present

## 2018-02-11 DIAGNOSIS — R7303 Prediabetes: Secondary | ICD-10-CM | POA: Diagnosis not present

## 2018-02-11 NOTE — Telephone Encounter (Signed)
Colletta Maryland from [facility] called to request verbal orders for patient to received OT 2 x a week for 4 weeks. Gave verbal OK on her VM. Told to call if has any questions.

## 2018-02-13 DIAGNOSIS — I2510 Atherosclerotic heart disease of native coronary artery without angina pectoris: Secondary | ICD-10-CM | POA: Diagnosis not present

## 2018-02-13 DIAGNOSIS — Z4789 Encounter for other orthopedic aftercare: Secondary | ICD-10-CM | POA: Diagnosis not present

## 2018-02-13 DIAGNOSIS — I129 Hypertensive chronic kidney disease with stage 1 through stage 4 chronic kidney disease, or unspecified chronic kidney disease: Secondary | ICD-10-CM | POA: Diagnosis not present

## 2018-02-13 DIAGNOSIS — M5412 Radiculopathy, cervical region: Secondary | ICD-10-CM | POA: Diagnosis not present

## 2018-02-13 DIAGNOSIS — G4700 Insomnia, unspecified: Secondary | ICD-10-CM | POA: Diagnosis not present

## 2018-02-13 DIAGNOSIS — M1990 Unspecified osteoarthritis, unspecified site: Secondary | ICD-10-CM | POA: Diagnosis not present

## 2018-02-13 DIAGNOSIS — R7303 Prediabetes: Secondary | ICD-10-CM | POA: Diagnosis not present

## 2018-02-13 DIAGNOSIS — N183 Chronic kidney disease, stage 3 (moderate): Secondary | ICD-10-CM | POA: Diagnosis not present

## 2018-02-13 DIAGNOSIS — G9009 Other idiopathic peripheral autonomic neuropathy: Secondary | ICD-10-CM | POA: Diagnosis not present

## 2018-02-16 DIAGNOSIS — G4700 Insomnia, unspecified: Secondary | ICD-10-CM | POA: Diagnosis not present

## 2018-02-16 DIAGNOSIS — G9009 Other idiopathic peripheral autonomic neuropathy: Secondary | ICD-10-CM | POA: Diagnosis not present

## 2018-02-16 DIAGNOSIS — I2510 Atherosclerotic heart disease of native coronary artery without angina pectoris: Secondary | ICD-10-CM | POA: Diagnosis not present

## 2018-02-16 DIAGNOSIS — N183 Chronic kidney disease, stage 3 (moderate): Secondary | ICD-10-CM | POA: Diagnosis not present

## 2018-02-16 DIAGNOSIS — M1990 Unspecified osteoarthritis, unspecified site: Secondary | ICD-10-CM | POA: Diagnosis not present

## 2018-02-16 DIAGNOSIS — Z4789 Encounter for other orthopedic aftercare: Secondary | ICD-10-CM | POA: Diagnosis not present

## 2018-02-16 DIAGNOSIS — R7303 Prediabetes: Secondary | ICD-10-CM | POA: Diagnosis not present

## 2018-02-16 DIAGNOSIS — M5412 Radiculopathy, cervical region: Secondary | ICD-10-CM | POA: Diagnosis not present

## 2018-02-16 DIAGNOSIS — I129 Hypertensive chronic kidney disease with stage 1 through stage 4 chronic kidney disease, or unspecified chronic kidney disease: Secondary | ICD-10-CM | POA: Diagnosis not present

## 2018-02-17 DIAGNOSIS — N183 Chronic kidney disease, stage 3 (moderate): Secondary | ICD-10-CM | POA: Diagnosis not present

## 2018-02-17 DIAGNOSIS — I2510 Atherosclerotic heart disease of native coronary artery without angina pectoris: Secondary | ICD-10-CM | POA: Diagnosis not present

## 2018-02-17 DIAGNOSIS — R7303 Prediabetes: Secondary | ICD-10-CM | POA: Diagnosis not present

## 2018-02-17 DIAGNOSIS — I129 Hypertensive chronic kidney disease with stage 1 through stage 4 chronic kidney disease, or unspecified chronic kidney disease: Secondary | ICD-10-CM | POA: Diagnosis not present

## 2018-02-17 DIAGNOSIS — M5412 Radiculopathy, cervical region: Secondary | ICD-10-CM | POA: Diagnosis not present

## 2018-02-17 DIAGNOSIS — G4700 Insomnia, unspecified: Secondary | ICD-10-CM | POA: Diagnosis not present

## 2018-02-17 DIAGNOSIS — Z4789 Encounter for other orthopedic aftercare: Secondary | ICD-10-CM | POA: Diagnosis not present

## 2018-02-17 DIAGNOSIS — M1990 Unspecified osteoarthritis, unspecified site: Secondary | ICD-10-CM | POA: Diagnosis not present

## 2018-02-17 DIAGNOSIS — Z981 Arthrodesis status: Secondary | ICD-10-CM | POA: Diagnosis not present

## 2018-02-17 DIAGNOSIS — G9009 Other idiopathic peripheral autonomic neuropathy: Secondary | ICD-10-CM | POA: Diagnosis not present

## 2018-02-18 DIAGNOSIS — R7303 Prediabetes: Secondary | ICD-10-CM | POA: Diagnosis not present

## 2018-02-18 DIAGNOSIS — M5412 Radiculopathy, cervical region: Secondary | ICD-10-CM | POA: Diagnosis not present

## 2018-02-18 DIAGNOSIS — G9009 Other idiopathic peripheral autonomic neuropathy: Secondary | ICD-10-CM | POA: Diagnosis not present

## 2018-02-18 DIAGNOSIS — Z4789 Encounter for other orthopedic aftercare: Secondary | ICD-10-CM | POA: Diagnosis not present

## 2018-02-18 DIAGNOSIS — G4700 Insomnia, unspecified: Secondary | ICD-10-CM | POA: Diagnosis not present

## 2018-02-18 DIAGNOSIS — M1990 Unspecified osteoarthritis, unspecified site: Secondary | ICD-10-CM | POA: Diagnosis not present

## 2018-02-18 DIAGNOSIS — I2510 Atherosclerotic heart disease of native coronary artery without angina pectoris: Secondary | ICD-10-CM | POA: Diagnosis not present

## 2018-02-18 DIAGNOSIS — N183 Chronic kidney disease, stage 3 (moderate): Secondary | ICD-10-CM | POA: Diagnosis not present

## 2018-02-18 DIAGNOSIS — I129 Hypertensive chronic kidney disease with stage 1 through stage 4 chronic kidney disease, or unspecified chronic kidney disease: Secondary | ICD-10-CM | POA: Diagnosis not present

## 2018-02-19 DIAGNOSIS — I129 Hypertensive chronic kidney disease with stage 1 through stage 4 chronic kidney disease, or unspecified chronic kidney disease: Secondary | ICD-10-CM | POA: Diagnosis not present

## 2018-02-19 DIAGNOSIS — G959 Disease of spinal cord, unspecified: Secondary | ICD-10-CM | POA: Diagnosis not present

## 2018-02-19 DIAGNOSIS — I2510 Atherosclerotic heart disease of native coronary artery without angina pectoris: Secondary | ICD-10-CM | POA: Diagnosis not present

## 2018-02-19 DIAGNOSIS — Z981 Arthrodesis status: Secondary | ICD-10-CM | POA: Diagnosis not present

## 2018-02-19 DIAGNOSIS — N183 Chronic kidney disease, stage 3 (moderate): Secondary | ICD-10-CM | POA: Diagnosis not present

## 2018-02-19 DIAGNOSIS — G9009 Other idiopathic peripheral autonomic neuropathy: Secondary | ICD-10-CM | POA: Diagnosis not present

## 2018-02-19 DIAGNOSIS — G4700 Insomnia, unspecified: Secondary | ICD-10-CM | POA: Diagnosis not present

## 2018-02-19 DIAGNOSIS — Z4789 Encounter for other orthopedic aftercare: Secondary | ICD-10-CM | POA: Diagnosis not present

## 2018-02-19 DIAGNOSIS — M1990 Unspecified osteoarthritis, unspecified site: Secondary | ICD-10-CM | POA: Diagnosis not present

## 2018-02-19 DIAGNOSIS — R7303 Prediabetes: Secondary | ICD-10-CM | POA: Diagnosis not present

## 2018-02-19 DIAGNOSIS — M5412 Radiculopathy, cervical region: Secondary | ICD-10-CM | POA: Diagnosis not present

## 2018-02-20 DIAGNOSIS — R7303 Prediabetes: Secondary | ICD-10-CM | POA: Diagnosis not present

## 2018-02-20 DIAGNOSIS — I2510 Atherosclerotic heart disease of native coronary artery without angina pectoris: Secondary | ICD-10-CM | POA: Diagnosis not present

## 2018-02-20 DIAGNOSIS — N183 Chronic kidney disease, stage 3 (moderate): Secondary | ICD-10-CM | POA: Diagnosis not present

## 2018-02-20 DIAGNOSIS — M5412 Radiculopathy, cervical region: Secondary | ICD-10-CM | POA: Diagnosis not present

## 2018-02-20 DIAGNOSIS — G4700 Insomnia, unspecified: Secondary | ICD-10-CM | POA: Diagnosis not present

## 2018-02-20 DIAGNOSIS — M1990 Unspecified osteoarthritis, unspecified site: Secondary | ICD-10-CM | POA: Diagnosis not present

## 2018-02-20 DIAGNOSIS — I129 Hypertensive chronic kidney disease with stage 1 through stage 4 chronic kidney disease, or unspecified chronic kidney disease: Secondary | ICD-10-CM | POA: Diagnosis not present

## 2018-02-20 DIAGNOSIS — G9009 Other idiopathic peripheral autonomic neuropathy: Secondary | ICD-10-CM | POA: Diagnosis not present

## 2018-02-20 DIAGNOSIS — Z4789 Encounter for other orthopedic aftercare: Secondary | ICD-10-CM | POA: Diagnosis not present

## 2018-02-23 DIAGNOSIS — I639 Cerebral infarction, unspecified: Secondary | ICD-10-CM | POA: Diagnosis not present

## 2018-02-23 DIAGNOSIS — R29898 Other symptoms and signs involving the musculoskeletal system: Secondary | ICD-10-CM | POA: Diagnosis not present

## 2018-02-23 DIAGNOSIS — R4182 Altered mental status, unspecified: Secondary | ICD-10-CM | POA: Diagnosis not present

## 2018-02-23 DIAGNOSIS — T43215A Adverse effect of selective serotonin and norepinephrine reuptake inhibitors, initial encounter: Secondary | ICD-10-CM | POA: Diagnosis not present

## 2018-02-23 DIAGNOSIS — R2990 Unspecified symptoms and signs involving the nervous system: Secondary | ICD-10-CM | POA: Diagnosis not present

## 2018-02-23 DIAGNOSIS — I952 Hypotension due to drugs: Secondary | ICD-10-CM | POA: Diagnosis not present

## 2018-02-23 DIAGNOSIS — E119 Type 2 diabetes mellitus without complications: Secondary | ICD-10-CM | POA: Diagnosis not present

## 2018-02-23 DIAGNOSIS — R531 Weakness: Secondary | ICD-10-CM | POA: Diagnosis not present

## 2018-02-23 DIAGNOSIS — M5412 Radiculopathy, cervical region: Secondary | ICD-10-CM | POA: Diagnosis not present

## 2018-02-23 DIAGNOSIS — E782 Mixed hyperlipidemia: Secondary | ICD-10-CM | POA: Diagnosis not present

## 2018-02-23 DIAGNOSIS — I10 Essential (primary) hypertension: Secondary | ICD-10-CM | POA: Diagnosis not present

## 2018-02-23 DIAGNOSIS — R51 Headache: Secondary | ICD-10-CM | POA: Diagnosis not present

## 2018-02-23 DIAGNOSIS — R7303 Prediabetes: Secondary | ICD-10-CM | POA: Diagnosis not present

## 2018-02-23 DIAGNOSIS — T426X5A Adverse effect of other antiepileptic and sedative-hypnotic drugs, initial encounter: Secondary | ICD-10-CM | POA: Diagnosis not present

## 2018-02-23 DIAGNOSIS — Z8673 Personal history of transient ischemic attack (TIA), and cerebral infarction without residual deficits: Secondary | ICD-10-CM | POA: Diagnosis not present

## 2018-02-23 DIAGNOSIS — Z950 Presence of cardiac pacemaker: Secondary | ICD-10-CM | POA: Diagnosis not present

## 2018-02-23 DIAGNOSIS — Z21 Asymptomatic human immunodeficiency virus [HIV] infection status: Secondary | ICD-10-CM | POA: Diagnosis not present

## 2018-02-23 DIAGNOSIS — Z981 Arthrodesis status: Secondary | ICD-10-CM | POA: Diagnosis not present

## 2018-02-23 DIAGNOSIS — R262 Difficulty in walking, not elsewhere classified: Secondary | ICD-10-CM | POA: Diagnosis not present

## 2018-02-23 DIAGNOSIS — I2510 Atherosclerotic heart disease of native coronary artery without angina pectoris: Secondary | ICD-10-CM | POA: Diagnosis not present

## 2018-02-23 DIAGNOSIS — T465X5A Adverse effect of other antihypertensive drugs, initial encounter: Secondary | ICD-10-CM | POA: Diagnosis not present

## 2018-02-23 DIAGNOSIS — M6281 Muscle weakness (generalized): Secondary | ICD-10-CM | POA: Diagnosis not present

## 2018-02-23 DIAGNOSIS — Z7982 Long term (current) use of aspirin: Secondary | ICD-10-CM | POA: Diagnosis not present

## 2018-02-27 ENCOUNTER — Other Ambulatory Visit: Payer: Self-pay

## 2018-02-27 NOTE — Patient Outreach (Signed)
[facility]) Care Management  02/27/2018  Betty Vaughn 1951-07-02 [id number]   Referral received. No outreach warranted at this time. Transition of Care  will be completed by primary care provider office who will refer to [facility] care management if needed.  Plan: RN CM will close case at this time.    [name], RN, MSN [facility] Care Management Care Management Coordinator Direct Line [phone] Toll Free: [phone]  Fax: [phone]

## 2018-03-03 DIAGNOSIS — G4700 Insomnia, unspecified: Secondary | ICD-10-CM | POA: Diagnosis not present

## 2018-03-03 DIAGNOSIS — R7303 Prediabetes: Secondary | ICD-10-CM | POA: Diagnosis not present

## 2018-03-03 DIAGNOSIS — G9009 Other idiopathic peripheral autonomic neuropathy: Secondary | ICD-10-CM | POA: Diagnosis not present

## 2018-03-03 DIAGNOSIS — M1990 Unspecified osteoarthritis, unspecified site: Secondary | ICD-10-CM | POA: Diagnosis not present

## 2018-03-03 DIAGNOSIS — I129 Hypertensive chronic kidney disease with stage 1 through stage 4 chronic kidney disease, or unspecified chronic kidney disease: Secondary | ICD-10-CM | POA: Diagnosis not present

## 2018-03-03 DIAGNOSIS — Z4789 Encounter for other orthopedic aftercare: Secondary | ICD-10-CM | POA: Diagnosis not present

## 2018-03-03 DIAGNOSIS — I2510 Atherosclerotic heart disease of native coronary artery without angina pectoris: Secondary | ICD-10-CM | POA: Diagnosis not present

## 2018-03-03 DIAGNOSIS — M5412 Radiculopathy, cervical region: Secondary | ICD-10-CM | POA: Diagnosis not present

## 2018-03-03 DIAGNOSIS — N183 Chronic kidney disease, stage 3 (moderate): Secondary | ICD-10-CM | POA: Diagnosis not present

## 2018-03-05 DIAGNOSIS — Z981 Arthrodesis status: Secondary | ICD-10-CM | POA: Diagnosis not present

## 2018-03-05 DIAGNOSIS — I2510 Atherosclerotic heart disease of native coronary artery without angina pectoris: Secondary | ICD-10-CM | POA: Diagnosis not present

## 2018-03-05 DIAGNOSIS — N183 Chronic kidney disease, stage 3 (moderate): Secondary | ICD-10-CM | POA: Diagnosis not present

## 2018-03-05 DIAGNOSIS — I129 Hypertensive chronic kidney disease with stage 1 through stage 4 chronic kidney disease, or unspecified chronic kidney disease: Secondary | ICD-10-CM | POA: Diagnosis not present

## 2018-03-05 DIAGNOSIS — M5412 Radiculopathy, cervical region: Secondary | ICD-10-CM | POA: Diagnosis not present

## 2018-03-05 DIAGNOSIS — G4700 Insomnia, unspecified: Secondary | ICD-10-CM | POA: Diagnosis not present

## 2018-03-05 DIAGNOSIS — Z4789 Encounter for other orthopedic aftercare: Secondary | ICD-10-CM | POA: Diagnosis not present

## 2018-03-05 DIAGNOSIS — R7303 Prediabetes: Secondary | ICD-10-CM | POA: Diagnosis not present

## 2018-03-05 DIAGNOSIS — G9009 Other idiopathic peripheral autonomic neuropathy: Secondary | ICD-10-CM | POA: Diagnosis not present

## 2018-03-05 DIAGNOSIS — M1990 Unspecified osteoarthritis, unspecified site: Secondary | ICD-10-CM | POA: Diagnosis not present

## 2018-03-06 DIAGNOSIS — N183 Chronic kidney disease, stage 3 (moderate): Secondary | ICD-10-CM | POA: Diagnosis not present

## 2018-03-06 DIAGNOSIS — I129 Hypertensive chronic kidney disease with stage 1 through stage 4 chronic kidney disease, or unspecified chronic kidney disease: Secondary | ICD-10-CM | POA: Diagnosis not present

## 2018-03-06 DIAGNOSIS — M5412 Radiculopathy, cervical region: Secondary | ICD-10-CM | POA: Diagnosis not present

## 2018-03-06 DIAGNOSIS — G4700 Insomnia, unspecified: Secondary | ICD-10-CM | POA: Diagnosis not present

## 2018-03-06 DIAGNOSIS — G9009 Other idiopathic peripheral autonomic neuropathy: Secondary | ICD-10-CM | POA: Diagnosis not present

## 2018-03-06 DIAGNOSIS — R7303 Prediabetes: Secondary | ICD-10-CM | POA: Diagnosis not present

## 2018-03-06 DIAGNOSIS — I2510 Atherosclerotic heart disease of native coronary artery without angina pectoris: Secondary | ICD-10-CM | POA: Diagnosis not present

## 2018-03-06 DIAGNOSIS — M1990 Unspecified osteoarthritis, unspecified site: Secondary | ICD-10-CM | POA: Diagnosis not present

## 2018-03-06 DIAGNOSIS — Z4789 Encounter for other orthopedic aftercare: Secondary | ICD-10-CM | POA: Diagnosis not present

## 2018-03-09 ENCOUNTER — Other Ambulatory Visit: Payer: Self-pay | Admitting: Internal Medicine

## 2018-03-09 ENCOUNTER — Ambulatory Visit (INDEPENDENT_AMBULATORY_CARE_PROVIDER_SITE_OTHER): Payer: Medicare HMO | Admitting: Internal Medicine

## 2018-03-09 ENCOUNTER — Encounter: Payer: Self-pay | Admitting: Internal Medicine

## 2018-03-09 VITALS — BP 122/86 | HR 70 | Ht 61.0 in | Wt 139.0 lb

## 2018-03-09 DIAGNOSIS — I10 Essential (primary) hypertension: Secondary | ICD-10-CM

## 2018-03-09 DIAGNOSIS — I129 Hypertensive chronic kidney disease with stage 1 through stage 4 chronic kidney disease, or unspecified chronic kidney disease: Secondary | ICD-10-CM | POA: Diagnosis not present

## 2018-03-09 DIAGNOSIS — G4700 Insomnia, unspecified: Secondary | ICD-10-CM | POA: Diagnosis not present

## 2018-03-09 DIAGNOSIS — I2510 Atherosclerotic heart disease of native coronary artery without angina pectoris: Secondary | ICD-10-CM | POA: Diagnosis not present

## 2018-03-09 DIAGNOSIS — M62838 Other muscle spasm: Secondary | ICD-10-CM

## 2018-03-09 DIAGNOSIS — R7309 Other abnormal glucose: Secondary | ICD-10-CM

## 2018-03-09 DIAGNOSIS — M5412 Radiculopathy, cervical region: Secondary | ICD-10-CM | POA: Diagnosis not present

## 2018-03-09 DIAGNOSIS — I639 Cerebral infarction, unspecified: Secondary | ICD-10-CM

## 2018-03-09 DIAGNOSIS — R2990 Unspecified symptoms and signs involving the nervous system: Secondary | ICD-10-CM

## 2018-03-09 DIAGNOSIS — R7303 Prediabetes: Secondary | ICD-10-CM | POA: Diagnosis not present

## 2018-03-09 DIAGNOSIS — G9009 Other idiopathic peripheral autonomic neuropathy: Secondary | ICD-10-CM | POA: Diagnosis not present

## 2018-03-09 DIAGNOSIS — M1990 Unspecified osteoarthritis, unspecified site: Secondary | ICD-10-CM | POA: Diagnosis not present

## 2018-03-09 DIAGNOSIS — G8918 Other acute postprocedural pain: Secondary | ICD-10-CM | POA: Diagnosis not present

## 2018-03-09 DIAGNOSIS — N183 Chronic kidney disease, stage 3 (moderate): Secondary | ICD-10-CM | POA: Diagnosis not present

## 2018-03-09 DIAGNOSIS — Z4789 Encounter for other orthopedic aftercare: Secondary | ICD-10-CM | POA: Diagnosis not present

## 2018-03-09 MED ORDER — OXYCODONE HCL 5 MG PO TABS: 0.5000 mg | ORAL_TABLET | Freq: Three times a day (TID) | ORAL | 0 refills | Status: DC | PRN

## 2018-03-09 MED ORDER — OXYCODONE HCL 5 MG PO TABA: 0.5000 | ORAL_TABLET | Freq: Three times a day (TID) | ORAL | 0 refills | Status: DC | PRN

## 2018-03-09 MED ORDER — METHOCARBAMOL 500 MG PO TABS: 500.0000 mg | ORAL_TABLET | Freq: Four times a day (QID) | ORAL | 3 refills | Status: DC

## 2018-03-09 NOTE — Progress Notes (Signed)
Date:  03/09/2018   Name:  Betty Vaughn   DOB:  [birth year]   MRN:  [id number]   Chief Complaint: Hospitalization Follow-up (F/up from hospital stay for stroke. Patient is accompanied by her daughter today. )  Admitted to [facility] 02/23/18 to 02/24/18 with stroke like sx.  See summary below - all workup was negative, including MRI brain, ECHO and bubble study. She did have mild persistence of left sided weakness so was started on aspirin.  Blood pressure medications were adjusted w/r to dose timing.   # Left sided weakness and extinction Her exam was challenging upon initial encounter due to giveway and perseveration, but she had some left upper and lower extremity weakness, about a 4/5, which was new based on revision of Neurosurgery notes. She was also not oriented to time and place. She did not have a facial droop despite this being reported by her family. Stroke was suspected due to the reportedly acute onset of this symptoms. CTH and CTA were negative and she was outside the window for tPA. MRI brain and cervical spine showed no stroke or abnormality to explain her weakness. Work-up for stroke risk factors revealed an HbA1c 6.1% (on 02/02/2018; not on any home antihyperglycemics), normal TSH, normal TTE with bubble. She was restarted on her home aspirin with Neurosurgery in agreement; she was given a dose of 324 mg in the ED and resumed her home dose of 81 mg daily (has a history of CAD). The next morning, her mental status had improved and her left-sided weakness had largely resolved, with only residual weakness (4+/5). Ultimately, differential diagnosis for this new weakness was: pain limiting movement, undetected hypotensive episode (as she was taking 3 antihypertensives in the AM), drowsiness due to medications (she takes gabapentin and trazodone), and TIA (unlikely given prolonged symptoms with no MRI abnormalities). These considerations were explained to the patient and her family and she was  advised to take her amlodipine 5 mg at night instead of the morning.    Hypertension  This is a chronic problem. The problem is controlled. Pertinent negatives include no chest pain, headaches, palpitations or shortness of breath.   Tremor - seen by Neurology and now worse with post op pain.  Under the care of family around the clock at home with OT and PT.    Cervical fusion with muscle transplant - continue brace as instructed.  Will see surgeon next week.  Took her last pain pill this am.  She is here with her youngest daughter.  She admits to being very uncomfortable and having trouble with sleep.  Review of Systems  Constitutional: Negative for chills, fatigue and fever.  Respiratory: Negative for chest tightness and shortness of breath.   Cardiovascular: Negative for chest pain, palpitations and leg swelling.  Genitourinary: Positive for dysuria.  Musculoskeletal: Positive for arthralgias, back pain, gait problem and joint swelling.  Skin: Positive for wound (anterior surgical site with small irritated area).  Neurological: Positive for tremors. Negative for dizziness and headaches.  Hematological: Negative for adenopathy. Does not bruise/bleed easily.  Psychiatric/Behavioral: Positive for sleep disturbance.    Patient Active Problem List   Diagnosis Date Noted  . 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 Cervical stenosis of spinal canal 01/14/2018  . Personal history of colonic polyps   . Rectal polyp   . Tobacco use disorder 11/20/2016  . Neuropathy 04/10/2016  . Glucose intolerance (impaired glucose tolerance) 05/13/2015  . Benign essential tremor 05/13/2015  . Chronic  insomnia 05/13/2015  . 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 Multiple joint complaints 05/13/2015  . Depression, major, recurrent, in partial remission ([place]) 02/23/2014  . Acid reflux 02/23/2014  . Essential hypertension 02/23/2014  . Hyperlipidemia, unspecified 02/23/2014  . Chronic headache  disorder 01/16/2012  . 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 CAD S/P percutaneous coronary angioplasty 03/16/2010    Prior to Admission medications   Medication Sig Start Date End Date Taking? Authorizing Provider  ACCU-CHEK AVIVA PLUS test strip TEST twice a day 03/09/18   Glean Hess, MD  ACCU-CHEK SOFTCLIX LANCETS lancets TEST BLOOD SUGAR twice a day 03/09/18   Glean Hess, MD  albuterol (PROVENTIL HFA;VENTOLIN HFA) 108 (90 Base) MCG/ACT inhaler Inhale 2 puffs into the lungs every 6 (six) hours as needed for wheezing or shortness of breath. 11/10/16   Orbie Pyo, MD  amLODipine (NORVASC) 5 MG tablet take 1 tablet by mouth once daily 09/30/17   Glean Hess, MD  atorvastatin (LIPITOR) 40 MG tablet take 1 tablet by mouth once daily 09/30/17   Glean Hess, MD  Blood Glucose Monitoring Suppl (ACCU-CHEK AVIVA PLUS) W/DEVICE KIT use to TEST BLOOD SUGAR 06/21/15   Glean Hess, MD  chlorthalidone (HYGROTON) 25 MG tablet take 1 tablet by mouth once daily 09/30/17   Glean Hess, MD  DULoxetine (CYMBALTA) 60 MG capsule take 1 capsule by mouth once daily 03/09/18   Glean Hess, MD  gabapentin (NEURONTIN) 300 MG capsule take 1 capsule by mouth at bedtime 03/09/18   Glean Hess, MD  meloxicam [facility]) 15 MG tablet take 1 tablet by mouth once daily 09/30/17   Glean Hess, MD  methocarbamol (ROBAXIN) 500 MG tablet take 1 tablet by mouth four times a day 03/09/18   Glean Hess, MD  metoprolol tartrate (LOPRESSOR) 50 MG tablet take 1 tablet by mouth twice a day 03/09/18   Glean Hess, MD  nitroGLYCERIN (NITROSTAT) 0.4 MG SL tablet Place under the tongue.    [provider]  potassium chloride SA (K-DUR,KLOR-CON) 20 MEQ tablet take 1 tablet by mouth once daily 09/30/17   Glean Hess, MD  tamsulosin (FLOMAX) 0.4 MG CAPS capsule Take 1 capsule (0.4 mg total) by mouth daily. 08/26/17    Bjorn Loser, MD  traZODone (DESYREL) 50 MG tablet take 1 tablet by mouth at bedtime 09/30/17   Glean Hess, MD    Allergies  Allergen Reactions  . Azithromycin Swelling  . Propoxyphene Anaphylaxis  . Tramadol Anaphylaxis  . Penicillins Swelling  . Piroxicam Rash  . Sertraline Hcl Rash    Past Surgical History:  Procedure Laterality Date  . ABDOMINAL HYSTERECTOMY     total  . CERVICAL FUSION  2005  . CERVICAL FUSION  01/2018   C2-T2 posterior laminectomy and fusion; removal of previous hardware  . CHOLECYSTECTOMY    . COLONOSCOPY WITH PROPOFOL N/A 06/12/2017   Procedure: COLONOSCOPY WITH PROPOFOL;  Surgeon: Lucilla Lame, MD;  Location: [place];  Service: Gastroenterology;  Laterality: N/A;  . 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"  . HAND SURGERY Bilateral 2001  . POLYPECTOMY  2000  . POLYPECTOMY N/A 06/12/2017   Procedure: POLYPECTOMY;  Surgeon: Lucilla Lame, MD;  Location: [place];  Service: Gastroenterology;  Laterality: N/A;  . rigger finger      Social History   Tobacco Use  . Smoking status: Current Every Day Smoker  Packs/day: 0.50    Years: 30.00    Pack years: 15.00    Types: Cigarettes  . Smokeless tobacco: Never Used  Substance Use Topics  . Alcohol use: Yes    Alcohol/week: 2.4 oz    Types: 4 Standard drinks or equivalent per week  . Drug use: No     Medication list has been reviewed and updated.  PHQ 2/9 Scores 01/07/2018 08/29/2017 04/28/2017 04/10/2016  PHQ - 2 Score 0 6 0 0  PHQ- 9 Score 0 14 - -    Physical Exam  Constitutional: She is oriented to person, place, and time. She appears well-developed. She appears distressed (uncomfortable).  HENT:  Head: Normocephalic and atraumatic.  Eyes: Pupils are equal, round, and reactive to light.  Cardiovascular: Normal rate, regular rhythm and normal heart sounds.  Pulmonary/Chest: Effort normal. No respiratory distress. She has no wheezes.  Musculoskeletal:  Normal range of motion.  Post surgical brace from neck to mid chest in place Spasm and pain of both trapezius muscles noted Grips 4+/5 Tremors of both LEs - able to stand and transfer but too weak to walk  Neurological: She is alert and oriented to person, place, and time.  Skin: Skin is warm and dry. No rash noted.  Surgical wound not examined  Psychiatric: She has a normal mood and affect. Her speech is normal and behavior is normal. Thought content normal.    BP 122/86   Pulse 70   Ht 5' 1"  (1.549 m)   Wt 139 lb (63 kg)   SpO2 99%   BMI 26.26 kg/m   Assessment and Plan: 1. Stroke-like episode ([place]) Possible TIA - more likely related to pain Recommend 2.5 mg oxycodone 3 times a day to get pain under control  2. Essential hypertension stable  3. Muscle spasm Trial of ice to both shoulders Increase robaxin to qid - methocarbamol (ROBAXIN) 500 MG tablet; Take 1 tablet (500 mg total) by mouth 4 (four) times daily.  Dispense: 120 tablet; Refill: 3  4. Post-op pain As above - oxyCODONE (OXY IR/ROXICODONE) 5 MG immediate release tablet; Take 0.5 tablets (2.5 mg total) by mouth every 8 (eight) hours as needed for severe pain.  Dispense: 20 tablet; Refill: 0   Meds ordered this encounter  Medications  . methocarbamol (ROBAXIN) 500 MG tablet    Sig: Take 1 tablet (500 mg total) by mouth 4 (four) times daily.    Dispense:  120 tablet    Refill:  3  . DISCONTD: OxyCODONE HCl, Abuse Deter, (OXAYDO) 5 MG TABA    Sig: Take 0.5 tablets by mouth 3 (three) times daily as needed.    Dispense:  20 tablet    Refill:  0  . oxyCODONE (OXY IR/ROXICODONE) 5 MG immediate release tablet    Sig: Take 0.5 tablets (2.5 mg total) by mouth every 8 (eight) hours as needed for severe pain.    Dispense:  20 tablet    Refill:  0    Partially dictated using Editor, commissioning. Any errors are unintentional.  Halina Maidens, MD [facility]  03/09/2018

## 2018-03-10 DIAGNOSIS — I2510 Atherosclerotic heart disease of native coronary artery without angina pectoris: Secondary | ICD-10-CM | POA: Diagnosis not present

## 2018-03-10 DIAGNOSIS — N183 Chronic kidney disease, stage 3 (moderate): Secondary | ICD-10-CM | POA: Diagnosis not present

## 2018-03-10 DIAGNOSIS — R7303 Prediabetes: Secondary | ICD-10-CM | POA: Diagnosis not present

## 2018-03-10 DIAGNOSIS — G4700 Insomnia, unspecified: Secondary | ICD-10-CM | POA: Diagnosis not present

## 2018-03-10 DIAGNOSIS — M1990 Unspecified osteoarthritis, unspecified site: Secondary | ICD-10-CM | POA: Diagnosis not present

## 2018-03-10 DIAGNOSIS — I129 Hypertensive chronic kidney disease with stage 1 through stage 4 chronic kidney disease, or unspecified chronic kidney disease: Secondary | ICD-10-CM | POA: Diagnosis not present

## 2018-03-10 DIAGNOSIS — Z4789 Encounter for other orthopedic aftercare: Secondary | ICD-10-CM | POA: Diagnosis not present

## 2018-03-10 DIAGNOSIS — M5412 Radiculopathy, cervical region: Secondary | ICD-10-CM | POA: Diagnosis not present

## 2018-03-10 DIAGNOSIS — G9009 Other idiopathic peripheral autonomic neuropathy: Secondary | ICD-10-CM | POA: Diagnosis not present

## 2018-03-11 DIAGNOSIS — G4700 Insomnia, unspecified: Secondary | ICD-10-CM | POA: Diagnosis not present

## 2018-03-11 DIAGNOSIS — G9009 Other idiopathic peripheral autonomic neuropathy: Secondary | ICD-10-CM | POA: Diagnosis not present

## 2018-03-11 DIAGNOSIS — Z4789 Encounter for other orthopedic aftercare: Secondary | ICD-10-CM | POA: Diagnosis not present

## 2018-03-11 DIAGNOSIS — N183 Chronic kidney disease, stage 3 (moderate): Secondary | ICD-10-CM | POA: Diagnosis not present

## 2018-03-11 DIAGNOSIS — M1990 Unspecified osteoarthritis, unspecified site: Secondary | ICD-10-CM | POA: Diagnosis not present

## 2018-03-11 DIAGNOSIS — I129 Hypertensive chronic kidney disease with stage 1 through stage 4 chronic kidney disease, or unspecified chronic kidney disease: Secondary | ICD-10-CM | POA: Diagnosis not present

## 2018-03-11 DIAGNOSIS — I2510 Atherosclerotic heart disease of native coronary artery without angina pectoris: Secondary | ICD-10-CM | POA: Diagnosis not present

## 2018-03-11 DIAGNOSIS — R7303 Prediabetes: Secondary | ICD-10-CM | POA: Diagnosis not present

## 2018-03-11 DIAGNOSIS — M5412 Radiculopathy, cervical region: Secondary | ICD-10-CM | POA: Diagnosis not present

## 2018-03-13 DIAGNOSIS — R7303 Prediabetes: Secondary | ICD-10-CM | POA: Diagnosis not present

## 2018-03-13 DIAGNOSIS — I129 Hypertensive chronic kidney disease with stage 1 through stage 4 chronic kidney disease, or unspecified chronic kidney disease: Secondary | ICD-10-CM | POA: Diagnosis not present

## 2018-03-13 DIAGNOSIS — G4700 Insomnia, unspecified: Secondary | ICD-10-CM | POA: Diagnosis not present

## 2018-03-13 DIAGNOSIS — G9009 Other idiopathic peripheral autonomic neuropathy: Secondary | ICD-10-CM | POA: Diagnosis not present

## 2018-03-13 DIAGNOSIS — N183 Chronic kidney disease, stage 3 (moderate): Secondary | ICD-10-CM | POA: Diagnosis not present

## 2018-03-13 DIAGNOSIS — M1990 Unspecified osteoarthritis, unspecified site: Secondary | ICD-10-CM | POA: Diagnosis not present

## 2018-03-13 DIAGNOSIS — M5412 Radiculopathy, cervical region: Secondary | ICD-10-CM | POA: Diagnosis not present

## 2018-03-13 DIAGNOSIS — Z4789 Encounter for other orthopedic aftercare: Secondary | ICD-10-CM | POA: Diagnosis not present

## 2018-03-13 DIAGNOSIS — I2510 Atherosclerotic heart disease of native coronary artery without angina pectoris: Secondary | ICD-10-CM | POA: Diagnosis not present

## 2018-03-16 DIAGNOSIS — Z4789 Encounter for other orthopedic aftercare: Secondary | ICD-10-CM | POA: Diagnosis not present

## 2018-03-16 DIAGNOSIS — M1990 Unspecified osteoarthritis, unspecified site: Secondary | ICD-10-CM | POA: Diagnosis not present

## 2018-03-16 DIAGNOSIS — G4700 Insomnia, unspecified: Secondary | ICD-10-CM | POA: Diagnosis not present

## 2018-03-16 DIAGNOSIS — I2510 Atherosclerotic heart disease of native coronary artery without angina pectoris: Secondary | ICD-10-CM | POA: Diagnosis not present

## 2018-03-16 DIAGNOSIS — N183 Chronic kidney disease, stage 3 (moderate): Secondary | ICD-10-CM | POA: Diagnosis not present

## 2018-03-16 DIAGNOSIS — I129 Hypertensive chronic kidney disease with stage 1 through stage 4 chronic kidney disease, or unspecified chronic kidney disease: Secondary | ICD-10-CM | POA: Diagnosis not present

## 2018-03-16 DIAGNOSIS — G9009 Other idiopathic peripheral autonomic neuropathy: Secondary | ICD-10-CM | POA: Diagnosis not present

## 2018-03-16 DIAGNOSIS — M5412 Radiculopathy, cervical region: Secondary | ICD-10-CM | POA: Diagnosis not present

## 2018-03-16 DIAGNOSIS — R7303 Prediabetes: Secondary | ICD-10-CM | POA: Diagnosis not present

## 2018-03-17 DIAGNOSIS — Z981 Arthrodesis status: Secondary | ICD-10-CM | POA: Diagnosis not present

## 2018-03-18 DIAGNOSIS — G4700 Insomnia, unspecified: Secondary | ICD-10-CM | POA: Diagnosis not present

## 2018-03-18 DIAGNOSIS — R7303 Prediabetes: Secondary | ICD-10-CM | POA: Diagnosis not present

## 2018-03-18 DIAGNOSIS — N183 Chronic kidney disease, stage 3 (moderate): Secondary | ICD-10-CM | POA: Diagnosis not present

## 2018-03-18 DIAGNOSIS — I129 Hypertensive chronic kidney disease with stage 1 through stage 4 chronic kidney disease, or unspecified chronic kidney disease: Secondary | ICD-10-CM | POA: Diagnosis not present

## 2018-03-18 DIAGNOSIS — M1990 Unspecified osteoarthritis, unspecified site: Secondary | ICD-10-CM | POA: Diagnosis not present

## 2018-03-18 DIAGNOSIS — G9009 Other idiopathic peripheral autonomic neuropathy: Secondary | ICD-10-CM | POA: Diagnosis not present

## 2018-03-18 DIAGNOSIS — I2510 Atherosclerotic heart disease of native coronary artery without angina pectoris: Secondary | ICD-10-CM | POA: Diagnosis not present

## 2018-03-18 DIAGNOSIS — M5412 Radiculopathy, cervical region: Secondary | ICD-10-CM | POA: Diagnosis not present

## 2018-03-18 DIAGNOSIS — Z4789 Encounter for other orthopedic aftercare: Secondary | ICD-10-CM | POA: Diagnosis not present

## 2018-03-19 DIAGNOSIS — G4700 Insomnia, unspecified: Secondary | ICD-10-CM | POA: Diagnosis not present

## 2018-03-19 DIAGNOSIS — M1990 Unspecified osteoarthritis, unspecified site: Secondary | ICD-10-CM | POA: Diagnosis not present

## 2018-03-19 DIAGNOSIS — M5412 Radiculopathy, cervical region: Secondary | ICD-10-CM | POA: Diagnosis not present

## 2018-03-19 DIAGNOSIS — R7303 Prediabetes: Secondary | ICD-10-CM | POA: Diagnosis not present

## 2018-03-19 DIAGNOSIS — I2510 Atherosclerotic heart disease of native coronary artery without angina pectoris: Secondary | ICD-10-CM | POA: Diagnosis not present

## 2018-03-19 DIAGNOSIS — Z4789 Encounter for other orthopedic aftercare: Secondary | ICD-10-CM | POA: Diagnosis not present

## 2018-03-19 DIAGNOSIS — I129 Hypertensive chronic kidney disease with stage 1 through stage 4 chronic kidney disease, or unspecified chronic kidney disease: Secondary | ICD-10-CM | POA: Diagnosis not present

## 2018-03-19 DIAGNOSIS — G9009 Other idiopathic peripheral autonomic neuropathy: Secondary | ICD-10-CM | POA: Diagnosis not present

## 2018-03-19 DIAGNOSIS — N183 Chronic kidney disease, stage 3 (moderate): Secondary | ICD-10-CM | POA: Diagnosis not present

## 2018-03-20 DIAGNOSIS — M5412 Radiculopathy, cervical region: Secondary | ICD-10-CM | POA: Diagnosis not present

## 2018-03-20 DIAGNOSIS — I129 Hypertensive chronic kidney disease with stage 1 through stage 4 chronic kidney disease, or unspecified chronic kidney disease: Secondary | ICD-10-CM | POA: Diagnosis not present

## 2018-03-20 DIAGNOSIS — I2510 Atherosclerotic heart disease of native coronary artery without angina pectoris: Secondary | ICD-10-CM | POA: Diagnosis not present

## 2018-03-20 DIAGNOSIS — G9009 Other idiopathic peripheral autonomic neuropathy: Secondary | ICD-10-CM | POA: Diagnosis not present

## 2018-03-20 DIAGNOSIS — M1990 Unspecified osteoarthritis, unspecified site: Secondary | ICD-10-CM | POA: Diagnosis not present

## 2018-03-20 DIAGNOSIS — Z4789 Encounter for other orthopedic aftercare: Secondary | ICD-10-CM | POA: Diagnosis not present

## 2018-03-20 DIAGNOSIS — N183 Chronic kidney disease, stage 3 (moderate): Secondary | ICD-10-CM | POA: Diagnosis not present

## 2018-03-20 DIAGNOSIS — R7303 Prediabetes: Secondary | ICD-10-CM | POA: Diagnosis not present

## 2018-03-20 DIAGNOSIS — G4700 Insomnia, unspecified: Secondary | ICD-10-CM | POA: Diagnosis not present

## 2018-03-23 ENCOUNTER — Other Ambulatory Visit: Payer: Self-pay | Admitting: Internal Medicine

## 2018-03-23 DIAGNOSIS — M5412 Radiculopathy, cervical region: Secondary | ICD-10-CM | POA: Diagnosis not present

## 2018-03-23 DIAGNOSIS — G9009 Other idiopathic peripheral autonomic neuropathy: Secondary | ICD-10-CM | POA: Diagnosis not present

## 2018-03-23 DIAGNOSIS — I2510 Atherosclerotic heart disease of native coronary artery without angina pectoris: Secondary | ICD-10-CM | POA: Diagnosis not present

## 2018-03-23 DIAGNOSIS — M1990 Unspecified osteoarthritis, unspecified site: Secondary | ICD-10-CM | POA: Diagnosis not present

## 2018-03-23 DIAGNOSIS — N183 Chronic kidney disease, stage 3 (moderate): Secondary | ICD-10-CM | POA: Diagnosis not present

## 2018-03-23 DIAGNOSIS — G4700 Insomnia, unspecified: Secondary | ICD-10-CM | POA: Diagnosis not present

## 2018-03-23 DIAGNOSIS — I129 Hypertensive chronic kidney disease with stage 1 through stage 4 chronic kidney disease, or unspecified chronic kidney disease: Secondary | ICD-10-CM | POA: Diagnosis not present

## 2018-03-23 DIAGNOSIS — R7303 Prediabetes: Secondary | ICD-10-CM | POA: Diagnosis not present

## 2018-03-23 DIAGNOSIS — Z4789 Encounter for other orthopedic aftercare: Secondary | ICD-10-CM | POA: Diagnosis not present

## 2018-03-24 ENCOUNTER — Other Ambulatory Visit: Payer: Self-pay | Admitting: Internal Medicine

## 2018-03-24 DIAGNOSIS — Z4789 Encounter for other orthopedic aftercare: Secondary | ICD-10-CM | POA: Diagnosis not present

## 2018-03-24 DIAGNOSIS — M5412 Radiculopathy, cervical region: Secondary | ICD-10-CM | POA: Diagnosis not present

## 2018-03-24 DIAGNOSIS — N183 Chronic kidney disease, stage 3 (moderate): Secondary | ICD-10-CM | POA: Diagnosis not present

## 2018-03-24 DIAGNOSIS — G9009 Other idiopathic peripheral autonomic neuropathy: Secondary | ICD-10-CM | POA: Diagnosis not present

## 2018-03-24 DIAGNOSIS — I2510 Atherosclerotic heart disease of native coronary artery without angina pectoris: Secondary | ICD-10-CM | POA: Diagnosis not present

## 2018-03-24 DIAGNOSIS — R7303 Prediabetes: Secondary | ICD-10-CM | POA: Diagnosis not present

## 2018-03-24 DIAGNOSIS — I129 Hypertensive chronic kidney disease with stage 1 through stage 4 chronic kidney disease, or unspecified chronic kidney disease: Secondary | ICD-10-CM | POA: Diagnosis not present

## 2018-03-24 DIAGNOSIS — M1990 Unspecified osteoarthritis, unspecified site: Secondary | ICD-10-CM | POA: Diagnosis not present

## 2018-03-24 DIAGNOSIS — G4700 Insomnia, unspecified: Secondary | ICD-10-CM | POA: Diagnosis not present

## 2018-03-25 DIAGNOSIS — I2510 Atherosclerotic heart disease of native coronary artery without angina pectoris: Secondary | ICD-10-CM | POA: Diagnosis not present

## 2018-03-25 DIAGNOSIS — I129 Hypertensive chronic kidney disease with stage 1 through stage 4 chronic kidney disease, or unspecified chronic kidney disease: Secondary | ICD-10-CM | POA: Diagnosis not present

## 2018-03-25 DIAGNOSIS — M1990 Unspecified osteoarthritis, unspecified site: Secondary | ICD-10-CM | POA: Diagnosis not present

## 2018-03-25 DIAGNOSIS — G4700 Insomnia, unspecified: Secondary | ICD-10-CM | POA: Diagnosis not present

## 2018-03-25 DIAGNOSIS — R7303 Prediabetes: Secondary | ICD-10-CM | POA: Diagnosis not present

## 2018-03-25 DIAGNOSIS — M5412 Radiculopathy, cervical region: Secondary | ICD-10-CM | POA: Diagnosis not present

## 2018-03-25 DIAGNOSIS — G9009 Other idiopathic peripheral autonomic neuropathy: Secondary | ICD-10-CM | POA: Diagnosis not present

## 2018-03-25 DIAGNOSIS — Z4789 Encounter for other orthopedic aftercare: Secondary | ICD-10-CM | POA: Diagnosis not present

## 2018-03-25 DIAGNOSIS — N183 Chronic kidney disease, stage 3 (moderate): Secondary | ICD-10-CM | POA: Diagnosis not present

## 2018-03-26 DIAGNOSIS — M1990 Unspecified osteoarthritis, unspecified site: Secondary | ICD-10-CM | POA: Diagnosis not present

## 2018-03-26 DIAGNOSIS — M5412 Radiculopathy, cervical region: Secondary | ICD-10-CM | POA: Diagnosis not present

## 2018-03-26 DIAGNOSIS — G9009 Other idiopathic peripheral autonomic neuropathy: Secondary | ICD-10-CM | POA: Diagnosis not present

## 2018-03-26 DIAGNOSIS — Z4789 Encounter for other orthopedic aftercare: Secondary | ICD-10-CM | POA: Diagnosis not present

## 2018-03-26 DIAGNOSIS — N183 Chronic kidney disease, stage 3 (moderate): Secondary | ICD-10-CM | POA: Diagnosis not present

## 2018-03-26 DIAGNOSIS — I2510 Atherosclerotic heart disease of native coronary artery without angina pectoris: Secondary | ICD-10-CM | POA: Diagnosis not present

## 2018-03-26 DIAGNOSIS — I129 Hypertensive chronic kidney disease with stage 1 through stage 4 chronic kidney disease, or unspecified chronic kidney disease: Secondary | ICD-10-CM | POA: Diagnosis not present

## 2018-03-26 DIAGNOSIS — G4700 Insomnia, unspecified: Secondary | ICD-10-CM | POA: Diagnosis not present

## 2018-03-26 DIAGNOSIS — R7303 Prediabetes: Secondary | ICD-10-CM | POA: Diagnosis not present

## 2018-03-27 DIAGNOSIS — M1990 Unspecified osteoarthritis, unspecified site: Secondary | ICD-10-CM | POA: Diagnosis not present

## 2018-03-27 DIAGNOSIS — N183 Chronic kidney disease, stage 3 (moderate): Secondary | ICD-10-CM | POA: Diagnosis not present

## 2018-03-27 DIAGNOSIS — I2510 Atherosclerotic heart disease of native coronary artery without angina pectoris: Secondary | ICD-10-CM | POA: Diagnosis not present

## 2018-03-27 DIAGNOSIS — G4700 Insomnia, unspecified: Secondary | ICD-10-CM | POA: Diagnosis not present

## 2018-03-27 DIAGNOSIS — Z4789 Encounter for other orthopedic aftercare: Secondary | ICD-10-CM | POA: Diagnosis not present

## 2018-03-27 DIAGNOSIS — G9009 Other idiopathic peripheral autonomic neuropathy: Secondary | ICD-10-CM | POA: Diagnosis not present

## 2018-03-27 DIAGNOSIS — I129 Hypertensive chronic kidney disease with stage 1 through stage 4 chronic kidney disease, or unspecified chronic kidney disease: Secondary | ICD-10-CM | POA: Diagnosis not present

## 2018-03-27 DIAGNOSIS — R7303 Prediabetes: Secondary | ICD-10-CM | POA: Diagnosis not present

## 2018-03-27 DIAGNOSIS — M5412 Radiculopathy, cervical region: Secondary | ICD-10-CM | POA: Diagnosis not present

## 2018-04-01 DIAGNOSIS — I2510 Atherosclerotic heart disease of native coronary artery without angina pectoris: Secondary | ICD-10-CM | POA: Diagnosis not present

## 2018-04-01 DIAGNOSIS — G9009 Other idiopathic peripheral autonomic neuropathy: Secondary | ICD-10-CM | POA: Diagnosis not present

## 2018-04-01 DIAGNOSIS — G4700 Insomnia, unspecified: Secondary | ICD-10-CM | POA: Diagnosis not present

## 2018-04-01 DIAGNOSIS — N183 Chronic kidney disease, stage 3 (moderate): Secondary | ICD-10-CM | POA: Diagnosis not present

## 2018-04-01 DIAGNOSIS — M1990 Unspecified osteoarthritis, unspecified site: Secondary | ICD-10-CM | POA: Diagnosis not present

## 2018-04-01 DIAGNOSIS — M5412 Radiculopathy, cervical region: Secondary | ICD-10-CM | POA: Diagnosis not present

## 2018-04-01 DIAGNOSIS — R7303 Prediabetes: Secondary | ICD-10-CM | POA: Diagnosis not present

## 2018-04-01 DIAGNOSIS — Z4789 Encounter for other orthopedic aftercare: Secondary | ICD-10-CM | POA: Diagnosis not present

## 2018-04-01 DIAGNOSIS — I129 Hypertensive chronic kidney disease with stage 1 through stage 4 chronic kidney disease, or unspecified chronic kidney disease: Secondary | ICD-10-CM | POA: Diagnosis not present

## 2018-04-09 ENCOUNTER — Other Ambulatory Visit: Payer: Self-pay | Admitting: Internal Medicine

## 2018-04-09 DIAGNOSIS — G8918 Other acute postprocedural pain: Secondary | ICD-10-CM

## 2018-04-14 ENCOUNTER — Telehealth: Payer: Self-pay | Admitting: Internal Medicine

## 2018-04-14 NOTE — Telephone Encounter (Signed)
Patient is waiting on Dr [name] to send the Oxycodone request back to the pharmacist for oxyCODONE (OXY IR/ROXICODONE) 5 MG immediate release tablet  [facility] [place], [place] #:  [id number]

## 2018-04-14 NOTE — Telephone Encounter (Signed)
Denied and she is to contact Neurosurgery at [facility] for this Rx

## 2018-05-04 ENCOUNTER — Encounter: Payer: Self-pay | Admitting: Urology

## 2018-05-04 ENCOUNTER — Other Ambulatory Visit: Payer: Medicare HMO | Admitting: Urology

## 2018-05-13 ENCOUNTER — Ambulatory Visit: Payer: Medicare HMO

## 2018-05-19 DIAGNOSIS — Z981 Arthrodesis status: Secondary | ICD-10-CM | POA: Diagnosis not present

## 2018-05-19 DIAGNOSIS — M47812 Spondylosis without myelopathy or radiculopathy, cervical region: Secondary | ICD-10-CM | POA: Diagnosis not present

## 2018-05-20 ENCOUNTER — Other Ambulatory Visit: Payer: Self-pay | Admitting: Internal Medicine

## 2018-06-11 ENCOUNTER — Other Ambulatory Visit: Payer: Self-pay | Admitting: Internal Medicine

## 2018-06-17 ENCOUNTER — Ambulatory Visit (INDEPENDENT_AMBULATORY_CARE_PROVIDER_SITE_OTHER): Payer: Medicare HMO

## 2018-06-17 ENCOUNTER — Other Ambulatory Visit: Payer: Self-pay | Admitting: Internal Medicine

## 2018-06-17 VITALS — BP 112/70 | HR 75 | Temp 98.2°F | Resp 12 | Ht 61.0 in | Wt 139.2 lb

## 2018-06-17 DIAGNOSIS — H9192 Unspecified hearing loss, left ear: Secondary | ICD-10-CM | POA: Diagnosis not present

## 2018-06-17 DIAGNOSIS — Z598 Other problems related to housing and economic circumstances: Secondary | ICD-10-CM | POA: Diagnosis not present

## 2018-06-17 DIAGNOSIS — Z9181 History of falling: Secondary | ICD-10-CM

## 2018-06-17 DIAGNOSIS — F17200 Nicotine dependence, unspecified, uncomplicated: Secondary | ICD-10-CM

## 2018-06-17 DIAGNOSIS — Z23 Encounter for immunization: Secondary | ICD-10-CM

## 2018-06-17 DIAGNOSIS — E2839 Other primary ovarian failure: Secondary | ICD-10-CM

## 2018-06-17 DIAGNOSIS — Z0000 Encounter for general adult medical examination without abnormal findings: Secondary | ICD-10-CM

## 2018-06-17 DIAGNOSIS — H9190 Unspecified hearing loss, unspecified ear: Secondary | ICD-10-CM | POA: Diagnosis not present

## 2018-06-17 DIAGNOSIS — Z599 Problem related to housing and economic circumstances, unspecified: Secondary | ICD-10-CM

## 2018-06-17 MED ORDER — NICOTINE 7 MG/24HR TD PT24: 7.0000 mg | MEDICATED_PATCH | Freq: Every day | TRANSDERMAL | 2 refills | Status: DC

## 2018-06-17 NOTE — Progress Notes (Addendum)
Subjective:   Betty Vaughn is a 67 y.o. female who presents for Medicare Annual (Subsequent) preventive examination.  Review of Systems:  N/A Cardiac Risk Factors include: advanced age (>41mn, >>28women);dyslipidemia;sedentary lifestyle;smoking/ tobacco exposure;hypertension     Objective:     Vitals: BP 112/70 (BP Location: Right Arm, Patient Position: Sitting, Cuff Size: Normal)   Pulse 75   Temp 98.2 F (36.8 C) (Oral)   Resp 12   Ht 5' 1"  (1.549 m)   Wt 139 lb 3.2 oz (63.1 kg)   SpO2 92%   BMI 26.30 kg/m   Body mass index is 26.3 kg/m.  Advanced Directives 06/17/2018 06/12/2017 11/10/2016  Does Patient Have a Medical Advance Directive? No No No  Would patient like information on creating a medical advance directive? Yes (MAU/Ambulatory/Procedural Areas - Information given) No - Patient declined -    Tobacco Social History   Tobacco Use  Smoking Status Heavy Tobacco Smoker  . Packs/day: 0.80  . Years: 30.00  . Pack years: 24.00  . Types: Cigarettes  Smokeless Tobacco Never Used  Tobacco Comment   stress has caused her to resume smoking habits. Nicoderm ordered today per Dr. BArmy Melia    Ready to quit: Yes Counseling given: Yes Comment: stress has caused her to resume smoking habits. Nicoderm ordered today per Dr. BArmy Melia Clinical Intake:  Pre-visit preparation completed: Yes  Pain : 0-10 Pain Score: 6  Pain Type: Other (Comment)(postop pain) Pain Location: Neck Pain Orientation: Mid, Lateral Pain Descriptors / Indicators: Throbbing, Tingling, Burning, Aching, Nagging Pain Onset: More than a month ago Pain Frequency: Constant Pain Relieving Factors: narcotics Effect of Pain on Daily Activities: limited ability to perform ADL's Pain Relieving Factors: narcotics BMI - recorded: 26.3 Nutritional Status: BMI 25 -29 Overweight Nutritional Risks: None Diabetes: No  How often do you need to have someone help you when you read instructions,  pamphlets, or other written materials from your doctor or pharmacy?: 1 - Never  Interpreter Needed?: No  Information entered by :: AEversole, LPN  Past Medical History:  Diagnosis Date  . Allergy   . Arthritis   . Chronic insomnia   . Colon cancer ([place]    polyp was cancerous  . Diabetes mellitus without complication ([place]   . GERD (gastroesophageal reflux disease)   . Hx of colonic polyps   . Hyperlipidemia   . Hypertension   . Neuromuscular disorder ([place]   . Psoriasis    scalp   Past Surgical History:  Procedure Laterality Date  . ABDOMINAL HYSTERECTOMY     total  . CERVICAL FUSION  2005  . CERVICAL FUSION  01/2018   C2-T2 posterior laminectomy and fusion; removal of previous hardware  . CHOLECYSTECTOMY    . COLONOSCOPY WITH PROPOFOL N/A 06/12/2017   Procedure: COLONOSCOPY WITH PROPOFOL;  Surgeon: WLucilla Lame MD;  [place]  Service: Gastroenterology;  Laterality: N/A;  . 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"  . HAND SURGERY Bilateral 2001  . POLYPECTOMY  2000  . POLYPECTOMY N/A 06/12/2017   Procedure: POLYPECTOMY;  Surgeon: WLucilla Lame MD;  [place]  Service: Gastroenterology;  Laterality: N/A;  . rigger finger     Family History  Problem Relation Age of Onset  . Breast cancer Mother 655 . Diabetes Mother   . CAD Father   . Heart attack Father   . Diabetes Daughter   . Kidney failure Daughter   . Breast cancer Sister  60  . Sickle cell anemia Other   . Cancer Brother        lung  . Heart attack Sister   . Cancer Sister        ovarian  . Kidney cancer Neg Hx   . Bladder Cancer Neg Hx    Social History   Socioeconomic History  . Marital status: Divorced    Spouse name: Not on file  . Number of children: 3  . Years of education: Not on file  . Highest education level: 12th grade  Occupational History  . Occupation: Retired  Scientific laboratory technician  . Financial resource strain: Very hard  . Food  insecurity:    Worry: Sometimes true    Inability: Sometimes true  . Transportation needs:    Medical: No    Non-medical: No  Tobacco Use  . Smoking status: Heavy Tobacco Smoker    Packs/day: 0.80    Years: 30.00    Pack years: 24.00    Types: Cigarettes  . Smokeless tobacco: Never Used  . Tobacco comment: stress has caused her to resume smoking habits. Nicoderm ordered today per Dr. [name]  Substance and Sexual Activity  . Alcohol use: Yes    Comment: occ  . Drug use: No  . Sexual activity: Not Currently  Lifestyle  . Physical activity:    Days per week: 0 days    Minutes per session: 0 min  . Stress: Not at all  Relationships  . Social connections:    Talks on phone: Patient refused    Gets together: Patient refused    Attends religious service: Patient refused    Active member of club or organization: Patient refused    Attends meetings of clubs or organizations: Patient refused    Relationship status: Divorced  Other Topics Concern  . Not on file  Social History Narrative  . Not on file    Outpatient Encounter Medications as of 06/17/2018  Medication Sig  . ACCU-CHEK AVIVA PLUS test strip TEST twice a day  . ACCU-CHEK SOFTCLIX LANCETS lancets TEST BLOOD SUGAR twice a day  . albuterol (PROVENTIL HFA;VENTOLIN HFA) 108 (90 Base) MCG/ACT inhaler Inhale 2 puffs into the lungs every 6 (six) hours as needed for wheezing or shortness of breath.  Marland Kitchen amLODipine (NORVASC) 5 MG tablet take 1 tablet by mouth at bedtime  . atorvastatin (LIPITOR) 40 MG tablet take 1 tablet by mouth once daily  . Blood Glucose Monitoring Suppl (ACCU-CHEK AVIVA PLUS) W/DEVICE KIT use to TEST BLOOD SUGAR  . chlorthalidone (HYGROTON) 25 MG tablet take 1 tablet by mouth once daily  . docusate sodium (COLACE) 100 MG capsule Take 100 mg by mouth 2 (two) times daily.  . DULoxetine (CYMBALTA) 60 MG capsule take 1 capsule by mouth once daily  . gabapentin (NEURONTIN) 300 MG capsule take 1 capsule by mouth  at bedtime (Patient taking differently: Take 1 tablet twice daily.)  . meloxicam (MOBIC) 15 MG tablet take 1 tablet by mouth once daily  . methocarbamol (ROBAXIN) 500 MG tablet Take 1 tablet (500 mg total) by mouth 4 (four) times daily.  . metoprolol tartrate (LOPRESSOR) 50 MG tablet take 1 tablet by mouth twice a day  . Multiple Minerals-Vitamins (CALCIUM & VIT D3 BONE HEALTH PO) Take by mouth. 600-400 mg  . Multiple Vitamin (MULTI-VITAMIN DAILY PO) Take by mouth.  . nitroGLYCERIN (NITROSTAT) 0.4 MG SL tablet Place under the tongue.  . potassium chloride SA (K-DUR,KLOR-CON) 20 MEQ tablet take 1  tablet by mouth once daily  . RA ASPIRIN EC 81 MG EC tablet take 1 tablet by mouth once daily  . RA SENNA 8.6 MG tablet take 1 to 2 tablets by mouth once daily  . tamsulosin (FLOMAX) 0.4 MG CAPS capsule Take 1 capsule (0.4 mg total) by mouth daily.  . traZODone (DESYREL) 50 MG tablet take 1 tablets by mouth at bedtime  . Zinc 50 MG CAPS Take 220 mg by mouth.   . oxyCODONE (OXY IR/ROXICODONE) 5 MG immediate release tablet Take 0.5 tablets (2.5 mg total) by mouth every 8 (eight) hours as needed for severe pain. (Patient not taking: Reported on 06/17/2018)   No facility-administered encounter medications on file as of 06/17/2018.     Activities of Daily Living In your present state of health, do you have any difficulty performing the following activities: 06/17/2018  Hearing? 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 N  Comment wears eyeglasses  Difficulty concentrating or making decisions? N  Walking or climbing stairs? Y  Comment avoids stairs  Dressing or bathing? N  Doing errands, shopping? N  Preparing Food and eating ? N  Comment denies dentures  Using the Toilet? N  In the past six months, have you accidently leaked urine? N  Do you have problems with loss of bowel control? N  Managing your Medications? N  Managing your Finances? N  Housekeeping or managing your  Housekeeping? Y  Comment family assists  Some recent data might be hidden    Patient Care Team: Glean Hess, MD as PCP - General (Family Medicine) Bayard Hugger, MD as Consulting Physician (Neurosurgery) Laneta Simmers as Physician Assistant (Urology)    Assessment:   This is a routine wellness examination for [place].  Exercise Activities and Dietary recommendations Current Exercise Habits: The patient does not participate in regular exercise at present, Exercise limited by: neurologic condition(s);Other - see comments(cervical fusion; neck pain)  Goals    . DIET - INCREASE WATER INTAKE     Recommend to drink at least 6-8 8oz glasses of water per day.       Fall Risk Fall Risk  06/17/2018 04/28/2017 04/10/2016  Falls in the past year? No No No  Risk for fall due to : Impaired vision;Impaired balance/gait;History of fall(s);Medication side effect;Other (Comment) - -  Risk for fall due to: Comment wears eyeglasses; cervical fusion, ambulates with cane; dizziness - -   FALL RISK PREVENTION PERTAINING TO HOME: Is your home free of loose throw rugs in walkways, pet beds, electrical cords, etc? Yes Is there adequate lighting in your home to reduce risk of falls?  Yes Are there stairs in or around your home WITH handrails? No stairs  ASSISTIVE DEVICES UTILIZED TO PREVENT FALLS: Use of a cane, walker or w/c? Yes, cane Grab bars in the bathroom? No  Shower chair or a place to sit while bathing? Yes An elevated toilet seat or a handicapped toilet? Yes  Timed Get Up and Go Performed: Yes. Pt ambulated 10 feet within 28 sec. Gait slow, steady and with the use of an assistive device. No intervention required at this time. Fall risk prevention has been discussed.  Community Resource Referral:  Data processing manager Referral sent to [employer] for installation of grab bars in the shower.  Depression Screen PHQ 2/9 Scores 06/17/2018 01/07/2018 08/29/2017 04/28/2017  PHQ - 2  Score 6 0 6 0  PHQ- 9 Score 24  0 14 -     Cognitive Function: DECLINED - pt states she is in too much pain to complete today     6CIT Screen 04/28/2017  What Year? 0 points  What month? 0 points  What time? 0 points  Count back from 20 0 points  Months in reverse 0 points  Repeat phrase 0 points  Total Score 0    Immunization History  Administered Date(s) Administered  . H1N1 07/31/2017  . Influenza, High Dose Seasonal PF 08/29/2017  . Influenza-Unspecified 08/29/2017  . PPD Test 06/19/2015  . Pneumococcal Conjugate-13 06/17/2018  . Tdap 04/11/2016  . Zoster 06/12/2016    Qualifies for Shingles Vaccine? Yes. Zostavax completed 06/12/16. Due for Shingrix. Education has been provided regarding the importance of this vaccine. Pt has been advised to call insurance company to determine out of pocket expense. Advised may also receive vaccine at local pharmacy or Health Dept. Verbalized acceptance and understanding.  Screening Tests Health Maintenance  Topic Date Due  . DEXA SCAN  09/19/2016  . INFLUENZA VACCINE  12/30/2018 (Originally 06/25/2018)  . MAMMOGRAM  09/22/2018  . PNA vac Low Risk Adult (2 of 2 - PPSV23) 06/18/2019  . COLONOSCOPY  06/12/2022  . TETANUS/TDAP  04/11/2026  . Hepatitis C Screening  Completed    Cancer Screenings: Lung: Low Dose CT Chest recommended if Age 62-80 years, 30 pack-year currently smoking OR have quit w/in 15years. Patient does not qualify. Breast:  Up to date on Mammogram? Yes. Completed 09/22/17. Repeat every year   Up to date of Bone Density/Dexa? No. Ordered today. Message sent to referral coordinator for scheduling purposes. Pt aware she will receive a call from our office re: appt Colorectal: Completed 06/12/17. 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 Requesting to be  seen by audiology for further evaluation. Ordered placed today for pt to be seen by audiology.    Plan:  I have personally reviewed and addressed the Medicare Annual Wellness questionnaire and have noted the following in the patient's chart:  A. Medical and social history B. Use of alcohol, tobacco or illicit drugs  C. Current medications and supplements D. Functional ability and status E.  Nutritional status F.  Physical activity G. Advance directives H. List of other physicians I.  Hospitalizations, surgeries, and ER visits in previous 12 months J.  [place] such as hearing and vision if needed, cognitive and depression L. Referrals and appointments  In addition, I have reviewed and discussed with patient certain preventive protocols, quality metrics, and best practice recommendations. A written personalized care plan for preventive services as well as general preventive health recommendations were provided to patient.  Signed,  Aleatha Borer, LPN Nurse Health Advisor  MD Recommendations: Zostavax completed 06/12/16. Due for Shingrix. Education has been provided regarding the importance of this vaccine. Pt has been advised to call insurance company to determine out of pocket expense. Advised may also receive vaccine at local pharmacy or Health Dept. Verbalized acceptance and understanding.  Bone Density/Dexa: Ordered today. Message sent to referral coordinator for scheduling purposes. Pt aware she will receive a call from our office re: appt.  Requesting Nicoderm patches to assist with smoking cessation efforts.

## 2018-06-17 NOTE — Patient Instructions (Addendum)
Betty Vaughn , Thank you for taking time to come for your Medicare Wellness Visit. I appreciate your ongoing commitment to your health goals. Please review the following plan we discussed and let me know if I can assist you in the future.   Screening recommendations/referrals: Colorectal Screening: Up to date Mammogram: Up to date Bone Density: Declined  Vision and Dental Exams: Recommended annual ophthalmology exams for early detection of glaucoma and other disorders of the eye Recommended annual dental exams for proper oral hygiene  Vaccinations: Influenza vaccine: Up to date Pneumococcal vaccine: Up to date Tdap vaccine: Up to date Shingles vaccine: Please call your insurance company to determine your out of pocket expense for the Shingrix vaccine. You may also receive this vaccine at your local pharmacy or Health Dept.    Advanced directives: Advance directive discussed with you today. I have provided a copy for you to complete at home and have notarized. Once this is complete please bring a copy in to our office so we can scan it into your chart.  Goals: Recommend to drink at least 6-8 8oz glasses of water per day.  Next appointment: Please schedule your Annual Wellness Visit with your Nurse Health Advisor in one year.  Preventive Care 67 Years and Older, Female Preventive care refers to lifestyle choices and visits with your health care provider that can promote health and wellness. What does preventive care include?  A yearly physical exam. This is also called an annual well check.  Dental exams once or twice a year.  Routine eye exams. Ask your health care provider how often you should have your eyes checked.  Personal lifestyle choices, including:  Daily care of your teeth and gums.  Regular physical activity.  Eating a healthy diet.  Avoiding tobacco and drug use.  Limiting alcohol use.  Practicing safe sex.  Taking low-dose aspirin every day.  Taking vitamin  and mineral supplements as recommended by your health care provider. What happens during an annual well check? The services and screenings done by your health care provider during your annual well check will depend on your age, overall health, lifestyle risk factors, and family history of disease. Counseling  Your health care provider may ask you questions about your:  Alcohol use.  Tobacco use.  Drug use.  Emotional well-being.  Home and relationship well-being.  Sexual activity.  Eating habits.  History of falls.  Memory and ability to understand (cognition).  Work and work Statistician.  Reproductive health. Screening  You may have the following tests or measurements:  Height, weight, and BMI.  Blood pressure.  Lipid and cholesterol levels. These may be checked every 5 years, or more frequently if you are over 67 years old.  Skin check.  Lung cancer screening. You may have this screening every year starting at age 67 if you have a 30-pack-year history of smoking and currently smoke or have quit within the past 15 years.  Fecal occult blood test (FOBT) of the stool. You may have this test every year starting at age 67.  Flexible sigmoidoscopy or colonoscopy. You may have a sigmoidoscopy every 5 years or a colonoscopy every 10 years starting at age 67.  Hepatitis C blood test.  Hepatitis B blood test.  Sexually transmitted disease (STD) testing.  Diabetes screening. This is done by checking your blood sugar (glucose) after you have not eaten for a while (fasting). You may have this done every 1-3 years.  Bone density scan. This is done  to screen for osteoporosis. You may have this done starting at age 67.  Mammogram. This may be done every 1-2 years. Talk to your health care provider about how often you should have regular mammograms. Talk with your health care provider about your test results, treatment options, and if necessary, the need for more  tests. Vaccines  Your health care provider may recommend certain vaccines, such as:  Influenza vaccine. This is recommended every year.  Tetanus, diphtheria, and acellular pertussis (Tdap, Td) vaccine. You may need a Td booster every 10 years.  Zoster vaccine. You may need this after age 67.  Pneumococcal 13-valent conjugate (PCV13) vaccine. One dose is recommended after age 67.  Pneumococcal polysaccharide (PPSV23) vaccine. One dose is recommended after age 67. Talk to your health care provider about which screenings and vaccines you need and how often you need them. This information is not intended to replace advice given to you by your health care provider. Make sure you discuss any questions you have with your health care provider. Document Released: 12/08/2015 Document Revised: 07/31/2016 Document Reviewed: 09/12/2015 Elsevier Interactive Patient Education  2017 [place] Prevention in the Home Falls can cause injuries. They can happen to people of all ages. There are many things you can do to make your home safe and to help prevent falls. What can I do on the outside of my home?  Regularly fix the edges of walkways and driveways and fix any cracks.  Remove anything that might make you trip as you walk through a door, such as a raised step or threshold.  Trim any bushes or trees on the path to your home.  Use bright outdoor lighting.  Clear any walking paths of anything that might make someone trip, such as rocks or tools.  Regularly check to see if handrails are loose or broken. Make sure that both sides of any steps have handrails.  Any raised decks and porches should have guardrails on the edges.  Have any leaves, snow, or ice cleared regularly.  Use sand or salt on walking paths during winter.  Clean up any spills in your garage right away. This includes oil or grease spills. What can I do in the bathroom?  Use night lights.  Install grab bars by the  toilet and in the tub and shower. Do not use towel bars as grab bars.  Use non-skid mats or decals in the tub or shower.  If you need to sit down in the shower, use a plastic, non-slip stool.  Keep the floor dry. Clean up any water that spills on the floor as soon as it happens.  Remove soap buildup in the tub or shower regularly.  Attach bath mats securely with double-sided non-slip rug tape.  Do not have throw rugs and other things on the floor that can make you trip. What can I do in the bedroom?  Use night lights.  Make sure that you have a light by your bed that is easy to reach.  Do not use any sheets or blankets that are too big for your bed. They should not hang down onto the floor.  Have a firm chair that has side arms. You can use this for support while you get dressed.  Do not have throw rugs and other things on the floor that can make you trip. What can I do in the kitchen?  Clean up any spills right away.  Avoid walking on wet floors.  Keep items  that you use a lot in easy-to-reach places.  If you need to reach something above you, use a strong step stool that has a grab bar.  Keep electrical cords out of the way.  Do not use floor polish or wax that makes floors slippery. If you must use wax, use non-skid floor wax.  Do not have throw rugs and other things on the floor that can make you trip. What can I do with my stairs?  Do not leave any items on the stairs.  Make sure that there are handrails on both sides of the stairs and use them. Fix handrails that are broken or loose. Make sure that handrails are as long as the stairways.  Check any carpeting to make sure that it is firmly attached to the stairs. Fix any carpet that is loose or worn.  Avoid having throw rugs at the top or bottom of the stairs. If you do have throw rugs, attach them to the floor with carpet tape.  Make sure that you have a light switch at the top of the stairs and the bottom of  the stairs. If you do not have them, ask someone to add them for you. What else can I do to help prevent falls?  Wear shoes that:  Do not have high heels.  Have rubber bottoms.  Are comfortable and fit you well.  Are closed at the toe. Do not wear sandals.  If you use a stepladder:  Make sure that it is fully opened. Do not climb a closed stepladder.  Make sure that both sides of the stepladder are locked into place.  Ask someone to hold it for you, if possible.  Clearly mark and make sure that you can see:  Any grab bars or handrails.  First and last steps.  Where the edge of each step is.  Use tools that help you move around (mobility aids) if they are needed. These include:  Canes.  Walkers.  Scooters.  Crutches.  Turn on the lights when you go into a dark area. Replace any light bulbs as soon as they burn out.  Set up your furniture so you have a clear path. Avoid moving your furniture around.  If any of your floors are uneven, fix them.  If there are any pets around you, be aware of where they are.  Review your medicines with your doctor. Some medicines can make you feel dizzy. This can increase your chance of falling. Ask your doctor what other things that you can do to help prevent falls. This information is not intended to replace advice given to you by your health care provider. Make sure you discuss any questions you have with your health care provider. Document Released: 09/07/2009 Document Revised: 04/18/2016 Document Reviewed: 12/16/2014 Elsevier Interactive Patient Education  2017 [employer].

## 2018-06-22 ENCOUNTER — Ambulatory Visit: Payer: Medicare HMO | Admitting: Urology

## 2018-06-22 ENCOUNTER — Encounter: Payer: Self-pay | Admitting: Urology

## 2018-06-22 VITALS — BP 122/88 | HR 75 | Ht 61.0 in | Wt 138.1 lb

## 2018-06-22 DIAGNOSIS — R3915 Urgency of urination: Secondary | ICD-10-CM | POA: Diagnosis not present

## 2018-06-22 LAB — URINALYSIS, COMPLETE
BILIRUBIN UA: NEGATIVE
GLUCOSE, UA: NEGATIVE
KETONES UA: NEGATIVE
Leukocytes, UA: NEGATIVE
Nitrite, UA: NEGATIVE
Protein, UA: NEGATIVE
RBC, UA: NEGATIVE
SPEC GRAV UA: 1.015 (ref 1.005–1.030)
UUROB: 0.2 mg/dL (ref 0.2–1.0)
pH, UA: 7 (ref 5.0–7.5)

## 2018-06-22 LAB — MICROSCOPIC EXAMINATION: WBC UA: NONE SEEN /HPF (ref 0–5)

## 2018-06-22 MED ORDER — CIPROFLOXACIN HCL 500 MG PO TABS
500.0000 mg | ORAL_TABLET | Freq: Once | ORAL | Status: AC
Start: 2018-06-22 — End: 2018-06-22
  Administered 2018-06-22: 500 mg via ORAL

## 2018-06-22 MED ORDER — LIDOCAINE HCL URETHRAL/MUCOSAL 2 % EX GEL
1.0000 "application " | Freq: Once | CUTANEOUS | Status: AC
  Administered 2018-06-22: 1 via URETHRAL

## 2018-06-22 NOTE — Progress Notes (Signed)
06/22/2018 11:30 AM   Betty Vaughn 1951/10/24 [id number]  Referring provider: Glean Hess, MD [address] [place], [place] [address]  Chief Complaint  Patient presents with  . Cysto    HPI: Betty Vaughn: She states that she has been having issues with urgency and feelings of incomplete emptying for over the four years. It has been getting worse over the last few months. She is having to strain to get the urine out and feels a pulling sensation in her vagina. She has nocturia x 3 -4, hesitancy and a weak urinary stream. Patient states that she has not had urinary tract infections over the last year. She is continent  She failed the beta 3 agonists and her residual was 0 mL  Frequency and flow symptoms are stable. On urodynamics the patient emptied efficiently. Bladder capacity was 500 mL. Bladder was unstable reach a pressure of 9 cm water. She did not leak. She did not leak with a Valsalva pressure of 80 cm of water. During voiding she voided 500 mL with a maximum 25 mils per second. Maximum voiding pressure was 44 cm water and she emptied efficiently. EMG activity increased during the voiding phase. Bladder neck descent 1 cm. She did experience urgency with the instability. She did not complain of bladder pain.    The patient does hesitate before urination. She can stop and start. She does not always feel empty. Clinically not infected. She has had a pulling sensation in the suprapubic area especially with voiding. I think she's had chronic pain issues perhaps.  Reassess in about 5 weeks on Flomax and if her symptoms are not dramatically better she understands I will perform a cystoscopy then. She may or may not need a CT scan or cystoscopy with hydrodistention in the future.  Last urine culture was positive.   It was difficult to put the history together today and the patient cannot remember taking Flomax.  By history I think her symptoms actually got  better but now they are starting again.  She had some flow symptoms this morning where she would void a small amount and it was hard to start.  She said the urine is darker and there is almost an uncomfortable feeling  The patient might be having recurrent bladder infections.  Her symptoms are quite nonspecific so I think I should assume this.  I sent the urine for culture.  I will get a renal ultrasound and have her come back for cystoscopy.  The patient thinks she is been getting infections as well.    Today Frequency is stable.  1 of the last 2 urine cultures were positive and the ultrasound was never performed.  Patient still having flow symptoms.  Flomax not working.  Clinically not infected today  Pelvic: Mild cystocele and no stress incontinence  Cystoscopy: After verbal and written consent patient underwent flexible sterile cystoscopy.  Bladder mucosa and trigone were normal.  There is no foreign body or cystitis.  Urethra was normal.         PMH: Past Medical History:  Diagnosis Date  . Allergy   . Arthritis   . Chronic insomnia   . Colon cancer ([facility])    polyp was cancerous  . Diabetes mellitus without complication ([facility])   . GERD (gastroesophageal reflux disease)   . Hx of colonic polyps   . Hyperlipidemia   . Hypertension   . Neuromuscular disorder ([facility])   . Psoriasis    scalp  Surgical History: Past Surgical History:  Procedure Laterality Date  . ABDOMINAL HYSTERECTOMY     total  . CERVICAL FUSION  2005  . CERVICAL FUSION  01/2018   C2-T2 posterior laminectomy and fusion; removal of previous hardware  . CHOLECYSTECTOMY    . COLONOSCOPY WITH PROPOFOL N/A 06/12/2017   Procedure: COLONOSCOPY WITH PROPOFOL;  Surgeon: Lucilla Lame, MD;  Location: [facility];  Service: Gastroenterology;  Laterality: N/A;  . 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"  . HAND SURGERY Bilateral 2001  . POLYPECTOMY  2000  . POLYPECTOMY N/A 06/12/2017   Procedure:  POLYPECTOMY;  Surgeon: Lucilla Lame, MD;  Location: [facility];  Service: Gastroenterology;  Laterality: N/A;  . rigger finger      Home Medications:  Allergies as of 06/22/2018      Reactions   Azithromycin Swelling   Propoxyphene Anaphylaxis   Tramadol Anaphylaxis   Penicillins Swelling   Piroxicam Rash   Sertraline Hcl Rash      Medication List        Accurate as of 06/22/18 11:30 AM. Always use your most recent med list.          ACCU-CHEK AVIVA PLUS test strip Generic drug:  glucose blood TEST twice a day   ACCU-CHEK AVIVA PLUS w/Device Kit use to TEST BLOOD SUGAR   ACCU-CHEK SOFTCLIX LANCETS lancets TEST BLOOD SUGAR twice a day   albuterol 108 (90 Base) MCG/ACT inhaler Commonly known as:  PROVENTIL HFA;VENTOLIN HFA Inhale 2 puffs into the lungs every 6 (six) hours as needed for wheezing or shortness of breath.   amLODipine 5 MG tablet Commonly known as:  NORVASC take 1 tablet by mouth at bedtime   atorvastatin 40 MG tablet Commonly known as:  LIPITOR take 1 tablet by mouth once daily   CALCIUM & VIT D3 BONE HEALTH PO Take by mouth. 600-400 mg   chlorthalidone 25 MG tablet Commonly known as:  HYGROTON take 1 tablet by mouth once daily   docusate sodium 100 MG capsule Commonly known as:  COLACE Take 100 mg by mouth 2 (two) times daily.   DULoxetine 60 MG capsule Commonly known as:  CYMBALTA take 1 capsule by mouth once daily   gabapentin 300 MG capsule Commonly known as:  NEURONTIN take 1 capsule by mouth at bedtime   meloxicam 15 MG tablet Commonly known as:  MOBIC take 1 tablet by mouth once daily   methocarbamol 500 MG tablet Commonly known as:  ROBAXIN Take 1 tablet (500 mg total) by mouth 4 (four) times daily.   metoprolol tartrate 50 MG tablet Commonly known as:  LOPRESSOR take 1 tablet by mouth twice a day   MULTI-VITAMIN DAILY PO Take by mouth.   nicotine 7 mg/24hr patch Commonly known as:  NICODERM CQ - dosed in mg/24  hr Place 1 patch (7 mg total) onto the skin daily.   nitroGLYCERIN 0.4 MG SL tablet Commonly known as:  NITROSTAT Place under the tongue.   potassium chloride SA 20 MEQ tablet Commonly known as:  K-DUR,KLOR-CON take 1 tablet by mouth once daily   RA ASPIRIN EC 81 MG EC tablet Generic drug:  aspirin take 1 tablet by mouth once daily   RA SENNA 8.6 MG tablet Generic drug:  senna take 1 to 2 tablets by mouth once daily   tamsulosin 0.4 MG Caps capsule Commonly known as:  FLOMAX Take 1 capsule (0.4 mg total) by mouth daily.   traZODone 50 MG  tablet Commonly known as:  DESYREL take 1 tablets by mouth at bedtime   Zinc 50 MG Caps Take 220 mg by mouth.       Allergies:  Allergies  Allergen Reactions  . Azithromycin Swelling  . Propoxyphene Anaphylaxis  . Tramadol Anaphylaxis  . Penicillins Swelling  . Piroxicam Rash  . Sertraline Hcl Rash    Family History: Family History  Problem Relation Age of Onset  . Breast cancer Mother 60  . Diabetes Mother   . CAD Father   . Heart attack Father   . Diabetes Daughter   . Kidney failure Daughter   . Breast cancer Sister 19  . Sickle cell anemia Other   . Cancer Brother        lung  . Heart attack Sister   . Cancer Sister        ovarian  . Kidney cancer Neg Hx   . Bladder Cancer Neg Hx     Social History:  reports that she has been smoking cigarettes.  She has a 24.00 pack-year smoking history. She has never used smokeless tobacco. She reports that she drinks alcohol. She reports that she does not use drugs.  ROS:                                        Physical Exam: BP 122/88 (BP Location: Left Arm, Patient Position: Sitting, Cuff Size: Normal)   Pulse 75   Ht 5' 1"  (1.549 m)   Wt 138 lb 1.6 oz (62.6 kg)   BMI 26.09 kg/m   Constitutional:  Alert and oriented, No acute distress.  Laboratory Data: Lab Results  Component Value Date   WBC 7.5 04/28/2017   HGB 13.4 04/28/2017   HCT  39.6 04/28/2017   MCV 91 04/28/2017   PLT 251 04/28/2017    Lab Results  Component Value Date   CREATININE 1.16 (H) 01/07/2018    No results found for: PSA  No results found for: TESTOSTERONE  Lab Results  Component Value Date   HGBA1C 5.8 (H) 01/07/2018    Urinalysis    Component Value Date/Time   COLORURINE Straw 07/06/2013 1813   APPEARANCEUR Clear 01/05/2018 1533   LABSPEC 1.025 07/06/2013 1813   PHURINE 7.0 07/06/2013 1813   GLUCOSEU Negative 01/05/2018 1533   GLUCOSEU Negative 07/06/2013 1813   HGBUR 1+ 07/06/2013 1813   BILIRUBINUR Negative 01/05/2018 1533   BILIRUBINUR Negative 07/06/2013 1813   KETONESUR Negative 07/06/2013 1813   PROTEINUR Negative 01/05/2018 1533   PROTEINUR Negative 07/06/2013 1813   UROBILINOGEN 0.2 04/28/2017 1141   NITRITE Negative 01/05/2018 1533   NITRITE Negative 07/06/2013 1813   LEUKOCYTESUR 1+ (A) 01/05/2018 1533   LEUKOCYTESUR Negative 07/06/2013 1813    Pertinent Imaging:   Assessment & Plan: Assurance given for flow symptoms.  Pathophysiology is discussed.  See as needed  1. Urgency of urination  - Urinalysis, Complete - ciprofloxacin (CIPRO) tablet 500 mg - lidocaine (XYLOCAINE) 2 % jelly 1 application   No follow-ups on file.  Reece Packer, MD  [facility] [address] [place], [place] [address] [phone]

## 2018-07-03 DIAGNOSIS — Z981 Arthrodesis status: Secondary | ICD-10-CM | POA: Diagnosis not present

## 2018-07-07 ENCOUNTER — Ambulatory Visit (INDEPENDENT_AMBULATORY_CARE_PROVIDER_SITE_OTHER): Payer: Medicare HMO | Admitting: Internal Medicine

## 2018-07-07 ENCOUNTER — Other Ambulatory Visit: Payer: Self-pay | Admitting: Internal Medicine

## 2018-07-07 ENCOUNTER — Encounter: Payer: Self-pay | Admitting: Internal Medicine

## 2018-07-07 VITALS — BP 104/68 | HR 71 | Ht 61.0 in | Wt 137.0 lb

## 2018-07-07 DIAGNOSIS — M25511 Pain in right shoulder: Secondary | ICD-10-CM

## 2018-07-07 DIAGNOSIS — F3341 Major depressive disorder, recurrent, in partial remission: Secondary | ICD-10-CM | POA: Diagnosis not present

## 2018-07-07 DIAGNOSIS — E782 Mixed hyperlipidemia: Secondary | ICD-10-CM

## 2018-07-07 DIAGNOSIS — R7302 Impaired glucose tolerance (oral): Secondary | ICD-10-CM | POA: Diagnosis not present

## 2018-07-07 DIAGNOSIS — Z0000 Encounter for general adult medical examination without abnormal findings: Secondary | ICD-10-CM

## 2018-07-07 DIAGNOSIS — I10 Essential (primary) hypertension: Secondary | ICD-10-CM | POA: Diagnosis not present

## 2018-07-07 DIAGNOSIS — Z1239 Encounter for other screening for malignant neoplasm of breast: Secondary | ICD-10-CM

## 2018-07-07 DIAGNOSIS — Z1231 Encounter for screening mammogram for malignant neoplasm of breast: Secondary | ICD-10-CM

## 2018-07-07 DIAGNOSIS — F17200 Nicotine dependence, unspecified, uncomplicated: Secondary | ICD-10-CM | POA: Diagnosis not present

## 2018-07-07 LAB — POCT URINALYSIS DIPSTICK
Bilirubin, UA: NEGATIVE
Blood, UA: NEGATIVE
Glucose, UA: NEGATIVE
KETONES UA: NEGATIVE
Leukocytes, UA: NEGATIVE
NITRITE UA: NEGATIVE
PH UA: 6 (ref 5.0–8.0)
PROTEIN UA: NEGATIVE
UROBILINOGEN UA: 0.2 U/dL

## 2018-07-07 NOTE — Progress Notes (Signed)
Date:  07/07/2018   Name:  Betty Vaughn   DOB:  16-Feb-1951   MRN:  [id number]   Chief Complaint: Annual Exam (PHQ9= 8) Betty Vaughn is a 67 y.o. female who presents today for her Complete Annual Exam. She feels fairly well. She reports exercising some but limited due to neck pain. She reports she is sleeping fairly well. She had spinal fusion in March and is recovering.  Mammogram is due in October. DEXA ordered but not yet scheduled.  Hypertension  This is a chronic problem. The problem is controlled. Associated symptoms include neck pain. Pertinent negatives include no chest pain, headaches, palpitations or shortness of breath.  Hyperlipidemia  This is a chronic problem. The problem is controlled. Pertinent negatives include no chest pain or shortness of breath. Current antihyperlipidemic treatment includes statins. The current treatment provides significant improvement of lipids.  Diabetes  She presents for her follow-up diabetic visit. Diabetes type: glucose intolerance/prediabetes. Pertinent negatives for hypoglycemia include no dizziness, headaches, nervousness/anxiousness or tremors. Pertinent negatives for diabetes include no chest pain, no fatigue, no polydipsia and no polyuria.  Depression         This is a chronic problem.The problem is unchanged.  Associated symptoms include no fatigue, no headaches and no suicidal ideas.  Past treatments include SNRIs - Serotonin and norepinephrine reuptake inhibitors.  Compliance with treatment is good.  Previous treatment provided moderate relief. Shoulder Pain   The pain is present in the right shoulder. This is a new problem. The problem occurs daily. The problem has been gradually improving. The quality of the pain is described as aching. The pain is mild. Pertinent negatives include no fever. Treatments tried: soaking and stretching. The treatment provided mild relief.   Lab Results  Component Value Date   HGBA1C 5.8 (H) 01/07/2018    Lab Results  Component Value Date   CHOL 165 04/28/2017   HDL 56 04/28/2017   LDLCALC 94 04/28/2017   TRIG 74 04/28/2017   CHOLHDL 2.9 04/28/2017   Lab Results  Component Value Date   CREATININE 1.16 (H) 01/07/2018   BUN 23 01/07/2018   NA 140 01/07/2018   K 4.0 01/07/2018   CL 98 01/07/2018   CO2 24 01/07/2018     Review of Systems  Constitutional: Negative for chills, fatigue and fever.  HENT: Negative for congestion, hearing loss, tinnitus, trouble swallowing and voice change.   Eyes: Negative for visual disturbance.  Respiratory: Negative for cough, chest tightness, shortness of breath and wheezing.   Cardiovascular: Negative for chest pain, palpitations and leg swelling.  Gastrointestinal: Negative for abdominal pain, constipation, diarrhea and vomiting.  Endocrine: Negative for polydipsia and polyuria.  Genitourinary: Positive for dysuria. Negative for frequency, genital sores, vaginal bleeding and vaginal discharge.  Musculoskeletal: Positive for arthralgias and neck pain. Negative for gait problem and joint swelling.  Skin: Negative for color change and rash.  Neurological: Negative for dizziness, tremors, light-headedness and headaches.  Hematological: Negative for adenopathy. Does not bruise/bleed easily.  Psychiatric/Behavioral: Positive for depression and sleep disturbance. Negative for agitation, dysphoric mood and suicidal ideas. The patient is not nervous/anxious.     Patient Active Problem List   Diagnosis Date Noted  . 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 Cervical stenosis of spinal canal 01/14/2018  . Personal history of colonic polyps   . Rectal polyp   . Tobacco use disorder 11/20/2016  . Neuropathy 04/10/2016  . Glucose intolerance (impaired glucose tolerance) 05/13/2015  . Benign  essential tremor 05/13/2015  . Chronic insomnia 05/13/2015  . 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 Multiple joint complaints 05/13/2015  . Depression,  major, recurrent, in partial remission ([place]) 02/23/2014  . Acid reflux 02/23/2014  . Essential hypertension 02/23/2014  . Mixed hyperlipidemia 02/23/2014  . Chronic headache disorder 01/16/2012  . Syrinx of spinal cord ([place]) 01/16/2012  . 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 CAD S/P percutaneous coronary angioplasty 03/16/2010    Allergies  Allergen Reactions  . Azithromycin Swelling  . Propoxyphene Anaphylaxis  . Tramadol Anaphylaxis  . Penicillins Swelling  . Piroxicam Rash  . Sertraline Hcl Rash    Past Surgical History:  Procedure Laterality Date  . ABDOMINAL HYSTERECTOMY     total  . CERVICAL FUSION  2005  . CERVICAL FUSION  01/2018   C2-T2 posterior laminectomy and fusion; removal of previous hardware  . CHOLECYSTECTOMY    . COLONOSCOPY WITH PROPOFOL N/A 06/12/2017   Procedure: COLONOSCOPY WITH PROPOFOL;  Surgeon: Lucilla Lame, MD;  Location: [facility];  Service: Gastroenterology;  Laterality: N/A;  . 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"  . HAND SURGERY Bilateral 2001  . POLYPECTOMY  2000  . POLYPECTOMY N/A 06/12/2017   Procedure: POLYPECTOMY;  Surgeon: Lucilla Lame, MD;  Location: [facility];  Service: Gastroenterology;  Laterality: N/A;  . rigger finger      Social History   Tobacco Use  . Smoking status: Heavy Tobacco Smoker    Packs/day: 0.80    Years: 30.00    Pack years: 24.00    Types: Cigarettes  . Smokeless tobacco: Never Used  . Tobacco comment: stress has caused her to resume smoking habits. Nicoderm ordered today per Dr. [name]  Substance Use Topics  . Alcohol use: Yes    Comment: occ  . Drug use: No     Medication list has been reviewed and updated.  Current Meds  Medication Sig  . ACCU-CHEK AVIVA PLUS test strip TEST twice a day  . ACCU-CHEK SOFTCLIX LANCETS lancets TEST BLOOD SUGAR twice a day  . albuterol (PROVENTIL HFA;VENTOLIN HFA) 108 (90 Base) MCG/ACT inhaler Inhale 2 puffs into the lungs every 6  (six) hours as needed for wheezing or shortness of breath.  Marland Kitchen amLODipine (NORVASC) 5 MG tablet take 1 tablet by mouth at bedtime  . atorvastatin (LIPITOR) 40 MG tablet take 1 tablet by mouth once daily  . Blood Glucose Monitoring Suppl (ACCU-CHEK AVIVA PLUS) W/DEVICE KIT use to TEST BLOOD SUGAR  . chlorthalidone (HYGROTON) 25 MG tablet take 1 tablet by mouth once daily  . docusate sodium (COLACE) 100 MG capsule Take 100 mg by mouth 2 (two) times daily.  . DULoxetine (CYMBALTA) 60 MG capsule take 1 capsule by mouth once daily  . gabapentin (NEURONTIN) 300 MG capsule take 1 capsule by mouth at bedtime (Patient taking differently: 3 (three) times daily. )  . meloxicam (MOBIC) 15 MG tablet take 1 tablet by mouth once daily  . methocarbamol (ROBAXIN) 500 MG tablet Take 1 tablet (500 mg total) by mouth 4 (four) times daily.  . metoprolol tartrate (LOPRESSOR) 50 MG tablet take 1 tablet by mouth twice a day  . Multiple Minerals-Vitamins (CALCIUM & VIT D3 BONE HEALTH PO) Take by mouth. 600-400 mg  . Multiple Vitamin (MULTI-VITAMIN DAILY PO) Take by mouth.  . nicotine (NICODERM CQ - DOSED IN MG/24 HR) 7 mg/24hr patch Place 1 patch (7 mg total) onto the skin daily.  . nitroGLYCERIN (NITROSTAT) 0.4 MG SL tablet  Place under the tongue.  . potassium chloride SA (K-DUR,KLOR-CON) 20 MEQ tablet take 1 tablet by mouth once daily  . RA ASPIRIN EC 81 MG EC tablet take 1 tablet by mouth once daily  . RA SENNA 8.6 MG tablet take 1 to 2 tablets by mouth once daily  . tamsulosin (FLOMAX) 0.4 MG CAPS capsule Take 1 capsule (0.4 mg total) by mouth daily.  . traZODone (DESYREL) 50 MG tablet take 1 tablets by mouth at bedtime  . Zinc 50 MG CAPS Take 220 mg by mouth.     PHQ 2/9 Scores 07/07/2018 06/17/2018 01/07/2018 08/29/2017  PHQ - 2 Score 2 6 0 6  PHQ- 9 Score 8 24 0 14    Physical Exam  Constitutional: She is oriented to person, place, and time. She appears well-developed and well-nourished. No distress.  HENT:    Head: Normocephalic and atraumatic.  Right Ear: Tympanic membrane and ear canal normal.  Left Ear: Tympanic membrane and ear canal normal.  Nose: Right sinus exhibits no maxillary sinus tenderness. Left sinus exhibits no maxillary sinus tenderness.  Mouth/Throat: Uvula is midline and oropharynx is clear and moist.  Eyes: Conjunctivae and EOM are normal. Right eye exhibits no discharge. Left eye exhibits no discharge. No scleral icterus.  Neck: Normal range of motion. Carotid bruit is not present. No erythema present. No thyromegaly present.  Cardiovascular: Normal rate, regular rhythm, normal heart sounds and normal pulses.  Pulmonary/Chest: Effort normal and breath sounds normal. No respiratory distress. She has no wheezes. Right breast exhibits no mass, no nipple discharge, no skin change and no tenderness. Left breast exhibits no mass, no nipple discharge, no skin change and no tenderness.  Abdominal: Soft. Bowel sounds are normal. There is no hepatosplenomegaly. There is no tenderness. There is no CVA tenderness.  Musculoskeletal: Normal range of motion.  Lymphadenopathy:    She has no cervical adenopathy.    She has no axillary adenopathy.  Neurological: She is alert and oriented to person, place, and time. She has normal reflexes. No cranial nerve deficit or sensory deficit.  Skin: Skin is warm, dry and intact. No rash noted.  Psychiatric: She has a normal mood and affect. Her speech is normal and behavior is normal. Thought content normal.  Nursing note and vitals reviewed.   BP 104/68   Pulse 71   Ht 5' 1"  (1.549 m)   Wt 137 lb (62.1 kg)   SpO2 100%   BMI 25.89 kg/m   Assessment and Plan: 1. Annual physical exam - POCT urinalysis dipstick  2. Essential hypertension controlled - CBC with Differential/Platelet - Comprehensive metabolic panel - TSH  3. Glucose intolerance (impaired glucose tolerance) Continue diet control - Hemoglobin A1c  4. Mixed hyperlipidemia On  statin therapy - Lipid panel  5. Depression, major, recurrent, in partial remission ([place]) controlled  6. Encounter for screening mammogram for breast cancer - MM DIGITAL SCREENING BILATERAL; Future  7. Right shoulder pain, unspecified chronicity Improving with home exercise Consider Ortho referral if worsening  8. Tobacco use disorder Just started using patches - pt encouraged in her efforts   No orders of the defined types were placed in this encounter.   Partially dictated using Editor, commissioning. Any errors are unintentional.  Halina Maidens, MD [facility]  07/07/2018

## 2018-07-08 LAB — COMPREHENSIVE METABOLIC PANEL
ALBUMIN: 4.1 g/dL (ref 3.6–4.8)
ALT: 15 IU/L (ref 0–32)
AST: 21 IU/L (ref 0–40)
Albumin/Globulin Ratio: 1.2 (ref 1.2–2.2)
Alkaline Phosphatase: 104 IU/L (ref 39–117)
BUN / CREAT RATIO: 13 (ref 12–28)
BUN: 11 mg/dL (ref 8–27)
Bilirubin Total: 0.3 mg/dL (ref 0.0–1.2)
CALCIUM: 9.8 mg/dL (ref 8.7–10.3)
CO2: 24 mmol/L (ref 20–29)
CREATININE: 0.82 mg/dL (ref 0.57–1.00)
Chloride: 98 mmol/L (ref 96–106)
GFR calc Af Amer: 86 mL/min/{1.73_m2} (ref >59)
GFR, EST NON AFRICAN AMERICAN: 75 mL/min/{1.73_m2} (ref >59)
GLOBULIN, TOTAL: 3.5 g/dL (ref 1.5–4.5)
Glucose: 91 mg/dL (ref 65–99)
Potassium: 4.1 mmol/L (ref 3.5–5.2)
SODIUM: 139 mmol/L (ref 134–144)
Total Protein: 7.6 g/dL (ref 6.0–8.5)

## 2018-07-08 LAB — CBC WITH DIFFERENTIAL/PLATELET
Basophils Absolute: 0 10*3/uL (ref 0.0–0.2)
Basos: 0 %
EOS (ABSOLUTE): 0.1 10*3/uL (ref 0.0–0.4)
EOS: 1 %
HEMATOCRIT: 37.8 % (ref 34.0–46.6)
HEMOGLOBIN: 12.5 g/dL (ref 11.1–15.9)
IMMATURE GRANS (ABS): 0 10*3/uL (ref 0.0–0.1)
IMMATURE GRANULOCYTES: 0 %
Lymphocytes Absolute: 2 10*3/uL (ref 0.7–3.1)
Lymphs: 22 %
MCH: 29.4 pg (ref 26.6–33.0)
MCHC: 33.1 g/dL (ref 31.5–35.7)
MCV: 89 fL (ref 79–97)
MONOCYTES: 6 %
Monocytes Absolute: 0.5 10*3/uL (ref 0.1–0.9)
NEUTROS PCT: 71 %
Neutrophils Absolute: 6.4 10*3/uL (ref 1.4–7.0)
Platelets: 352 10*3/uL (ref 150–450)
RBC: 4.25 x10E6/uL (ref 3.77–5.28)
RDW: 13 % (ref 12.3–15.4)
WBC: 9 10*3/uL (ref 3.4–10.8)

## 2018-07-08 LAB — LIPID PANEL
CHOL/HDL RATIO: 3.1 ratio (ref 0.0–4.4)
Cholesterol, Total: 119 mg/dL (ref 100–199)
HDL: 39 mg/dL — AB (ref >39)
LDL Calculated: 64 mg/dL (ref 0–99)
Triglycerides: 82 mg/dL (ref 0–149)
VLDL CHOLESTEROL CAL: 16 mg/dL (ref 5–40)

## 2018-07-08 LAB — TSH: TSH: 2.49 u[IU]/mL (ref 0.450–4.500)

## 2018-07-08 LAB — HEMOGLOBIN A1C
ESTIMATED AVERAGE GLUCOSE: 137 mg/dL
Hgb A1c MFr Bld: 6.4 % — ABNORMAL HIGH (ref 4.8–5.6)

## 2018-07-24 ENCOUNTER — Other Ambulatory Visit: Payer: Self-pay | Admitting: Internal Medicine

## 2018-08-05 DIAGNOSIS — Z9889 Other specified postprocedural states: Secondary | ICD-10-CM | POA: Diagnosis not present

## 2018-08-05 DIAGNOSIS — E782 Mixed hyperlipidemia: Secondary | ICD-10-CM | POA: Diagnosis not present

## 2018-08-05 DIAGNOSIS — I10 Essential (primary) hypertension: Secondary | ICD-10-CM | POA: Diagnosis not present

## 2018-08-05 DIAGNOSIS — I2510 Atherosclerotic heart disease of native coronary artery without angina pectoris: Secondary | ICD-10-CM | POA: Diagnosis not present

## 2018-09-04 ENCOUNTER — Ambulatory Visit: Payer: Medicare HMO | Admitting: Internal Medicine

## 2018-09-08 ENCOUNTER — Encounter: Payer: Self-pay | Admitting: Internal Medicine

## 2018-09-08 ENCOUNTER — Ambulatory Visit (INDEPENDENT_AMBULATORY_CARE_PROVIDER_SITE_OTHER): Payer: Medicare HMO | Admitting: Internal Medicine

## 2018-09-08 VITALS — BP 124/60 | HR 80 | Ht 61.0 in | Wt 138.0 lb

## 2018-09-08 DIAGNOSIS — G629 Polyneuropathy, unspecified: Secondary | ICD-10-CM | POA: Diagnosis not present

## 2018-09-08 DIAGNOSIS — M4802 Spinal stenosis, cervical region: Secondary | ICD-10-CM | POA: Diagnosis not present

## 2018-09-08 DIAGNOSIS — R29898 Other symptoms and signs involving the musculoskeletal system: Secondary | ICD-10-CM | POA: Diagnosis not present

## 2018-09-08 DIAGNOSIS — N289 Disorder of kidney and ureter, unspecified: Secondary | ICD-10-CM | POA: Diagnosis not present

## 2018-09-08 MED ORDER — GABAPENTIN 300 MG PO CAPS: 300.0000 mg | ORAL_CAPSULE | Freq: Two times a day (BID) | ORAL | 1 refills | Status: DC

## 2018-09-08 MED ORDER — MELOXICAM 15 MG PO TABS: 15.0000 mg | ORAL_TABLET | Freq: Every day | ORAL | 1 refills | Status: DC

## 2018-09-08 NOTE — Progress Notes (Signed)
Date:  09/08/2018   Name:  Betty Vaughn   DOB:  [birth year]   MRN:  [id number]   Chief Complaint: Shoulder Pain (Bilateral shoulder pain down into arms. Cannot raise arms or dress herself at this point. Was seen for this 08/13 and it is getting worse. Wants to see Dr Betty Vaughn again. Needs new referral. Has not seen him since 2016.)  Shoulder Pain   The pain is present in the left shoulder, right shoulder, left arm and right arm. This is a chronic problem. The problem occurs constantly. The problem has been unchanged. The quality of the pain is described as aching and burning. The pain is moderate. Associated symptoms include an inability to bear weight, a limited range of motion and numbness. Pertinent negatives include no fever. The symptoms are aggravated by activity. She has tried acetaminophen, NSAIDS, heat, cold and movement (and gabapentin) for the symptoms. The treatment provided no relief.  She has been to the neurosurgeon - cervical imaging showed intact hardware and no evidence of nerve impingement. She has been using heat and trying to exercise.  She is now needing more help to dress.  Her UE pain and tremor are worsening.  She had stopped mobic due to pharmacy not filling it.  She also is only taking gabapentin once a day. She has seen neurology in the past for tremor and would like to see him again.  Review of Systems  Constitutional: Negative for chills, fatigue and fever.  Respiratory: Negative for chest tightness, shortness of breath and wheezing.   Cardiovascular: Negative for chest pain and palpitations.  Gastrointestinal: Negative for abdominal pain, constipation and diarrhea.  Musculoskeletal: Positive for arthralgias, myalgias, neck pain and neck stiffness.  Neurological: Positive for tremors, weakness and numbness.    Patient Active Problem List   Diagnosis Date Noted  . 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 Cervical stenosis of spinal canal  01/14/2018  . Personal history of colonic polyps   . Rectal polyp   . Tobacco use disorder 11/20/2016  . Neuropathy 04/10/2016  . Glucose intolerance (impaired glucose tolerance) 05/13/2015  . Benign essential tremor 05/13/2015  . Chronic insomnia 05/13/2015  . 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 Multiple joint complaints 05/13/2015  . Depression, major, recurrent, in partial remission ([place]) 02/23/2014  . Acid reflux 02/23/2014  . Essential hypertension 02/23/2014  . Mixed hyperlipidemia 02/23/2014  . Chronic headache disorder 01/16/2012  . Syrinx of spinal cord ([place]) 01/16/2012  . 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 CAD S/P percutaneous coronary angioplasty 03/16/2010    Allergies  Allergen Reactions  . Azithromycin Swelling  . Propoxyphene Anaphylaxis  . Tramadol Anaphylaxis  . Penicillins Swelling  . Piroxicam Rash  . Sertraline Hcl Rash    Past Surgical History:  Procedure Laterality Date  . ABDOMINAL HYSTERECTOMY     total  . CERVICAL FUSION  2005  . CERVICAL FUSION  01/2018   C2-T2 posterior laminectomy and fusion; removal of previous hardware  . CHOLECYSTECTOMY    . COLONOSCOPY WITH PROPOFOL N/A 06/12/2017   Procedure: COLONOSCOPY WITH PROPOFOL;  Surgeon: Lucilla Lame, MD;  Location: [facility];  Service: Gastroenterology;  Laterality: N/A;  . 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"  . HAND SURGERY Bilateral 2001  . POLYPECTOMY  2000  . POLYPECTOMY N/A 06/12/2017   Procedure: POLYPECTOMY;  Surgeon: Lucilla Lame, MD;  Location: [facility];  Service: Gastroenterology;  Laterality: N/A;  . rigger finger  Social History   Tobacco Use  . Smoking status: Current Some Day Smoker    Packs/day: 0.20    Years: 30.00    Pack years: 6.00    Types: Cigarettes    Last attempt to quit: 07/05/2018    Years since quitting: 0.1  . Smokeless tobacco: Never Used  . Tobacco comment: stress has caused her to resume smoking habits. Nicoderm ordered  today per Dr. [name]  Substance Use Topics  . Alcohol use: Yes    Comment: occ  . Drug use: No     Medication list has been reviewed and updated.  Current Meds  Medication Sig  . ACCU-CHEK AVIVA PLUS test strip TEST twice a day  . ACCU-CHEK SOFTCLIX LANCETS lancets TEST BLOOD SUGAR twice a day  . albuterol (PROVENTIL HFA;VENTOLIN HFA) 108 (90 Base) MCG/ACT inhaler Inhale 2 puffs into the lungs every 6 (six) hours as needed for wheezing or shortness of breath.  Marland Kitchen amLODipine (NORVASC) 5 MG tablet take 1 tablet by mouth at bedtime  . atorvastatin (LIPITOR) 40 MG tablet take 1 tablet by mouth once daily  . Blood Glucose Monitoring Suppl (ACCU-CHEK AVIVA PLUS) W/DEVICE KIT use to TEST BLOOD SUGAR  . chlorthalidone (HYGROTON) 25 MG tablet take 1 tablet by mouth once daily  . docusate sodium (COLACE) 100 MG capsule Take 100 mg by mouth 2 (two) times daily.  . DULoxetine (CYMBALTA) 60 MG capsule take 1 capsule by mouth once daily  . gabapentin (NEURONTIN) 300 MG capsule TAKE 1 CAPSULE BY MOUTH AT BEDTIME  . meloxicam (MOBIC) 15 MG tablet take 1 tablet by mouth once daily  . metoprolol tartrate (LOPRESSOR) 50 MG tablet take 1 tablet by mouth twice a day  . Multiple Minerals-Vitamins (CALCIUM & VIT D3 BONE HEALTH PO) Take by mouth. 600-400 mg  . nicotine (NICODERM CQ - DOSED IN MG/24 HR) 7 mg/24hr patch Place 1 patch (7 mg total) onto the skin daily.  . nitroGLYCERIN (NITROSTAT) 0.4 MG SL tablet Place under the tongue.  . potassium chloride SA (K-DUR,KLOR-CON) 20 MEQ tablet take 1 tablet by mouth once daily  . RA ASPIRIN EC 81 MG EC tablet take 1 tablet by mouth once daily  . RA SENNA 8.6 MG tablet take 1 to 2 tablets by mouth once daily  . tamsulosin (FLOMAX) 0.4 MG CAPS capsule Take 1 capsule (0.4 mg total) by mouth daily.  . traZODone (DESYREL) 50 MG tablet take 1 tablets by mouth at bedtime  . Zinc 50 MG CAPS Take 220 mg by mouth.     PHQ 2/9 Scores 07/07/2018 06/17/2018 01/07/2018  08/29/2017  PHQ - 2 Score 2 6 0 6  PHQ- 9 Score 8 24 0 14    Physical Exam  Constitutional: She appears well-developed. She appears distressed.  Cardiovascular: Normal rate, regular rhythm and normal heart sounds.  Pulmonary/Chest: Effort normal and breath sounds normal.  Musculoskeletal:       Right shoulder: She exhibits decreased range of motion, tenderness and spasm.       Left shoulder: She exhibits decreased range of motion, tenderness and spasm.       Cervical back: She exhibits decreased range of motion and tenderness.  General muscular tenderness of upper arms, pectoral muscles, trapezius muscles bilaterally Pain with passive ROM shoulders and arms Tremor of UEs with extension Grip 4/5 bilaterally  Neurological: She displays tremor. Sensory deficit: lateral left hand and fingers.    BP 124/60 (BP Location: Right Arm, Patient Position: Sitting,  Cuff Size: Normal)   Pulse 80   Ht 5' 1"  (1.549 m)   Wt 138 lb (62.6 kg)   SpO2 97%   BMI 26.07 kg/m   Assessment and Plan: 1. Cervical stenosis of spinal canal Imaging reported intact Resume mobic since renal function normalized - meloxicam (MOBIC) 15 MG tablet; Take 1 tablet (15 mg total) by mouth daily.  Dispense: 90 tablet; Refill: 1  2. Neuropathy Increase gabapentin to bid - gabapentin (NEURONTIN) 300 MG capsule; Take 1 capsule (300 mg total) by mouth 2 (two) times daily.  Dispense: 180 capsule; Refill: 1 - Ambulatory referral to Neurology  3. Weakness of both arms Needs further evaluation to determine etiology - Ambulatory referral to Neurology  4. Renal insufficiency Normal at last visit Continue to monitor   Partially dictated using Editor, commissioning. Any errors are unintentional.  Halina Maidens, MD [facility]  09/08/2018

## 2018-09-08 NOTE — Addendum Note (Signed)
Addended by: Glean Hess on: 09/08/2018 11:15 AM   Modules accepted: Level of Service

## 2018-09-23 ENCOUNTER — Ambulatory Visit
Admission: RE | Admit: 2018-09-23 | Discharge: 2018-09-23 | Disposition: A | Discharge status: Home / Self Care | Payer: Medicare HMO | Source: Ambulatory Visit | Attending: Internal Medicine | Admitting: Internal Medicine

## 2018-09-23 DIAGNOSIS — Z13820 Encounter for screening for osteoporosis: Secondary | ICD-10-CM | POA: Diagnosis not present

## 2018-09-23 DIAGNOSIS — M8588 Other specified disorders of bone density and structure, other site: Secondary | ICD-10-CM | POA: Insufficient documentation

## 2018-09-23 DIAGNOSIS — Z1231 Encounter for screening mammogram for malignant neoplasm of breast: Secondary | ICD-10-CM | POA: Insufficient documentation

## 2018-09-23 DIAGNOSIS — Z780 Asymptomatic menopausal state: Secondary | ICD-10-CM | POA: Insufficient documentation

## 2018-09-23 DIAGNOSIS — M85832 Other specified disorders of bone density and structure, left forearm: Secondary | ICD-10-CM | POA: Diagnosis not present

## 2018-09-23 DIAGNOSIS — Z1239 Encounter for other screening for malignant neoplasm of breast: Secondary | ICD-10-CM

## 2018-09-23 DIAGNOSIS — M85852 Other specified disorders of bone density and structure, left thigh: Secondary | ICD-10-CM | POA: Diagnosis not present

## 2018-09-23 DIAGNOSIS — M8589 Other specified disorders of bone density and structure, multiple sites: Secondary | ICD-10-CM | POA: Diagnosis not present

## 2018-09-23 DIAGNOSIS — E2839 Other primary ovarian failure: Secondary | ICD-10-CM

## 2018-09-28 ENCOUNTER — Other Ambulatory Visit: Payer: Self-pay | Admitting: Internal Medicine

## 2018-09-28 DIAGNOSIS — R7309 Other abnormal glucose: Secondary | ICD-10-CM

## 2018-10-05 ENCOUNTER — Other Ambulatory Visit: Payer: Self-pay | Admitting: Internal Medicine

## 2018-10-05 DIAGNOSIS — G629 Polyneuropathy, unspecified: Secondary | ICD-10-CM

## 2018-10-05 MED ORDER — ALBUTEROL SULFATE HFA 108 (90 BASE) MCG/ACT IN AERS: 2.0000 | INHALATION_SPRAY | Freq: Four times a day (QID) | RESPIRATORY_TRACT | 1 refills | Status: DC | PRN

## 2018-10-05 MED ORDER — DULOXETINE HCL 60 MG PO CPEP: 60.0000 mg | ORAL_CAPSULE | Freq: Every day | ORAL | 1 refills | Status: DC

## 2018-10-05 MED ORDER — GABAPENTIN 300 MG PO CAPS: 300.0000 mg | ORAL_CAPSULE | Freq: Two times a day (BID) | ORAL | 1 refills | Status: DC

## 2018-10-30 ENCOUNTER — Other Ambulatory Visit: Payer: Self-pay | Admitting: Internal Medicine

## 2018-11-23 ENCOUNTER — Other Ambulatory Visit: Payer: Self-pay | Admitting: Internal Medicine

## 2019-01-04 ENCOUNTER — Other Ambulatory Visit: Payer: Self-pay | Admitting: Internal Medicine

## 2019-01-05 ENCOUNTER — Other Ambulatory Visit: Payer: Self-pay | Admitting: Internal Medicine

## 2019-01-06 ENCOUNTER — Other Ambulatory Visit: Payer: Self-pay | Admitting: Internal Medicine

## 2019-01-06 ENCOUNTER — Encounter: Payer: Self-pay | Admitting: Internal Medicine

## 2019-01-06 ENCOUNTER — Other Ambulatory Visit: Payer: Self-pay

## 2019-01-06 ENCOUNTER — Ambulatory Visit (INDEPENDENT_AMBULATORY_CARE_PROVIDER_SITE_OTHER): Payer: Medicare HMO | Admitting: Internal Medicine

## 2019-01-06 VITALS — BP 132/82 | HR 69 | Resp 16 | Ht 61.0 in | Wt 145.6 lb

## 2019-01-06 DIAGNOSIS — I10 Essential (primary) hypertension: Secondary | ICD-10-CM

## 2019-01-06 DIAGNOSIS — F3341 Major depressive disorder, recurrent, in partial remission: Secondary | ICD-10-CM | POA: Diagnosis not present

## 2019-01-06 DIAGNOSIS — G629 Polyneuropathy, unspecified: Secondary | ICD-10-CM | POA: Diagnosis not present

## 2019-01-06 MED ORDER — GABAPENTIN 300 MG PO CAPS: 600.0000 mg | ORAL_CAPSULE | Freq: Two times a day (BID) | ORAL | 1 refills | Status: DC

## 2019-01-06 MED ORDER — BUPROPION HCL ER (XL) 150 MG PO TB24: 150.0000 mg | ORAL_TABLET | Freq: Every day | ORAL | 1 refills | Status: DC

## 2019-01-06 NOTE — Patient Instructions (Signed)
Suicidal Feelings: How to Help Yourself °Suicide is when you end your own life. There are many things you can do to help yourself feel better when struggling with these feelings. Many services and people are available to support you and others who struggle with similar feelings.  °If you ever feel like you may hurt yourself or others, or have thoughts about taking your own life, get help right away. To get help: °· Call your local emergency services (911 in the [place] °· Go to your nearest emergency department. °· Call a suicide hotline to speak with a trained counselor. The following suicide hotlines are available in the [place]: °? 1-800-273-TALK (1-800-273-8255). °? 1-800-SUICIDE (1-800-784-2433). °? 1-888-628-9454. This is a hotline for Spanish speakers. °? 1-800-799-4889. This is a hotline for TTY users. °? 1-866-4-U-TREVOR (1-866-488-7386). This is a hotline for lesbian, gay, bisexual, transgender, or questioning youth. °? For a list of hotlines in [place], visit www.suicide.org/hotlines/international/[place]-suicide-hotlines.html °· Contact a crisis center or a local suicide prevention center. To find a crisis center or suicide prevention center: °? Call your local hospital, clinic, community service organization, mental health center, social service provider, or health department. Ask for help with connecting to a crisis center. °? For a list of crisis centers in the [place], visit: suicidepreventionlifeline.org °? For a list of crisis centers in [place], visit: suicideprevention.ca °How to help yourself feel better ° °· Promise yourself that you will not do anything extreme when you have suicidal feelings. Remember, there is hope. Many people have gotten through suicidal thoughts and feelings, and you can too. If you have had these feelings before, remind yourself that you can get through them again. °· Let family, friends, teachers, or counselors know how you are feeling. Try not to separate  yourself from those who care about you and want to help you. Talk with someone every day, even if you do not feel sociable. Face-to-face conversation is best to help them understand your feelings. °· Contact a mental health care provider and work with this person regularly. °· Make a safety plan that you can follow during a crisis. Include phone numbers of suicide prevention hotlines, mental health professionals, and trusted friends and family members you can call during an emergency. Save these numbers on your phone. °· If you are thinking of taking a lot of medicine, give your medicine to someone who can give it to you as prescribed. If you are on antidepressants and are concerned you will overdose, tell your health care provider so that he or she can give you safer medicines. °· Try to stick to your routines. Follow a schedule every day. Make self-care a priority. °· Make a list of realistic goals, and cross them off when you achieve them. Accomplishments can give you a sense of worth. °· Wait until you are feeling better before doing things that you find difficult or unpleasant. °· Do things that you have always enjoyed to take your mind off your feelings. Try reading a book, or listening to or playing music. Spending time outside, in nature, may help you feel better. °Follow these instructions at home: ° °· Visit your primary health care provider every year for a checkup. °· Work with a mental health care provider as needed. °· Eat a well-balanced diet, and eat regular meals. °· Get plenty of rest. °· Exercise if you are able. Just 30 minutes of exercise each day can help you feel better. °· Take over-the-counter and prescription medicines only as told by   your health care provider. Ask your mental health care provider about the possible side effects of any medicines you are taking. °· Do not use alcohol or drugs, and remove these substances from your home. °· Remove weapons, poisons, knives, and other deadly  items from your home. °General recommendations °· Keep your living space well lit. °· When you are feeling well, write yourself a letter with tips and support that you can read when you are not feeling well. °· Remember that life's difficulties can be sorted out with help. Conditions can be treated, and you can learn behaviors and ways of thinking that will help you. °Where to find more information °· National Suicide Prevention Lifeline: www.suicidepreventionlifeline.org °· Hopeline: www.hopeline.com °· American Foundation for Suicide Prevention: www.afsp.org °· The Trevor Project (for lesbian, gay, bisexual, transgender, or questioning youth): www.thetrevorproject.org °Contact a health care provider if: °· You feel as though you are a burden to others. °· You feel agitated, angry, vengeful, or have extreme mood swings. °· You have withdrawn from family and friends. °Get help right away if: °· You are talking about suicide or wishing to die. °· You start making plans for how to commit suicide. °· You feel that you have no reason to live. °· You start making plans for putting your affairs in order, saying goodbye, or giving your possessions away. °· You feel guilt, shame, or unbearable pain, and it seems like there is no way out. °· You are frequently using drugs or alcohol. °· You are engaging in risky behaviors that could lead to death. °If you have any of these symptoms, get help right away. Call emergency services, go to your nearest emergency department or crisis center, or call a suicide crisis helpline. °Summary °· Suicide is when you take your own life. °· Promise yourself that you will not do anything extreme when you have suicidal feelings. °· Let family, friends, teachers, or counselors know how you are feeling. °· Get help right away if you feel as though life is getting too tough to handle and you are thinking about suicide. °This information is not intended to replace advice given to you by your health  care provider. Make sure you discuss any questions you have with your health care provider. °Document Released: 05/18/2003 Document Revised: 06/24/2017 Document Reviewed: 06/24/2017 °Elsevier Interactive Patient Education © 2019 [employer] ° °

## 2019-01-06 NOTE — Progress Notes (Signed)
Date:  01/06/2019   Name:  Betty Vaughn   DOB:  Apr 19, 1951   MRN:  [id number]   Chief Complaint: Hypertension; Depression (Feels meds do not work now and has family issues and to many deaths ); and Allergies  Hypertension  This is a chronic problem. The problem is controlled. Associated symptoms include neck pain. Pertinent negatives include no chest pain, headaches, palpitations or shortness of breath. Past treatments include calcium channel blockers, beta blockers and diuretics. The current treatment provides significant improvement. There are no compliance problems.   Depression         This is a chronic problem.  The problem occurs daily.  The problem has been gradually worsening since onset.  Associated symptoms include decreased concentration, fatigue, decreased interest and sad.  Associated symptoms include no headaches and no suicidal ideas.  Past treatments include SNRIs - Serotonin and norepinephrine reuptake inhibitors.  Compliance with treatment is good.  Previous treatment provided mild relief.  Risk factors include family history of mental illness and stress.  Hyperesthesias  - one spot on her mid right back just medial to the shoulder blade.  No injury and no rash.  The skin is painful but also feels itchy.  Review of Systems  Constitutional: Positive for fatigue. Negative for chills, diaphoresis and unexpected weight change.  Respiratory: Negative for cough, chest tightness, shortness of breath and wheezing.   Cardiovascular: Negative for chest pain, palpitations and leg swelling.  Musculoskeletal: Positive for arthralgias, back pain, neck pain and neck stiffness.  Neurological: Negative for dizziness and headaches.  Psychiatric/Behavioral: Positive for decreased concentration, depression and sleep disturbance. Negative for suicidal ideas. The patient is nervous/anxious.     Patient Active Problem List   Diagnosis Date Noted  . Weakness of both arms 09/08/2018  .  Renal insufficiency 09/08/2018  . 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 Cervical stenosis of spinal canal 01/14/2018  . Personal history of colonic polyps   . Rectal polyp   . Tobacco use disorder 11/20/2016  . Neuropathy 04/10/2016  . Glucose intolerance (impaired glucose tolerance) 05/13/2015  . Benign essential tremor 05/13/2015  . Chronic insomnia 05/13/2015  . 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 Multiple joint complaints 05/13/2015  . Depression, major, recurrent, in partial remission ([place]) 02/23/2014  . Acid reflux 02/23/2014  . Essential hypertension 02/23/2014  . Mixed hyperlipidemia 02/23/2014  . Chronic headache disorder 01/16/2012  . Syrinx of spinal cord ([place]) 01/16/2012  . 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 CAD S/P percutaneous coronary angioplasty 03/16/2010    Allergies  Allergen Reactions  . Azithromycin Swelling  . Propoxyphene Anaphylaxis  . Tramadol Anaphylaxis  . Penicillins Swelling  . Piroxicam Rash  . Sertraline Hcl Rash    Past Surgical History:  Procedure Laterality Date  . ABDOMINAL HYSTERECTOMY     total  . CERVICAL FUSION  2005  . CERVICAL FUSION  01/2018   C2-T2 posterior laminectomy and fusion; removal of previous hardware  . CHOLECYSTECTOMY    . COLONOSCOPY WITH PROPOFOL N/A 06/12/2017   Procedure: COLONOSCOPY WITH PROPOFOL;  Surgeon: Lucilla Lame, MD;  Location: [facility];  Service: Gastroenterology;  Laterality: N/A;  . 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"  . HAND SURGERY Bilateral 2001  . POLYPECTOMY  2000  . POLYPECTOMY N/A 06/12/2017   Procedure: POLYPECTOMY;  Surgeon: Lucilla Lame, MD;  Location: [facility];  Service: Gastroenterology;  Laterality: N/A;  . rigger finger      Social  History   Tobacco Use  . Smoking status: Current Some Day Smoker    Packs/day: 0.20    Years: 30.00    Pack years: 6.00    Types: Cigarettes    Last attempt to quit: 07/05/2018    Years since quitting:  0.5  . Smokeless tobacco: Never Used  . Tobacco comment: stress has caused her to resume smoking habits. Nicoderm ordered today per Dr. [name]  Substance Use Topics  . Alcohol use: Yes    Comment: occ  . Drug use: No     Medication list has been reviewed and updated.  Current Meds  Medication Sig  . ACCU-CHEK AVIVA PLUS test strip USE TO TEST BLOOD SUGAR TWICE DAILY  . ACCU-CHEK SOFTCLIX LANCETS lancets USE TO TEST BLOOD SUGAR TWICE DAILY  . albuterol (PROVENTIL HFA;VENTOLIN HFA) 108 (90 Base) MCG/ACT inhaler INHALE 2 PUFFS INTO THE LUNGS EVERY 6 HOURS AS NEEDED FOR WHEEZING OR SHORTNESS OF BREATH  . amLODipine (NORVASC) 5 MG tablet TAKE 1 TABLET BY MOUTH AT BEDTIME  . ASPIRIN LOW DOSE 81 MG EC tablet TAKE 1 TABLET BY MOUTH ONCE DAILY  . atorvastatin (LIPITOR) 40 MG tablet TAKE 1 TABLET BY MOUTH ONCE DAILY  . Blood Glucose Monitoring Suppl (ACCU-CHEK AVIVA PLUS) W/DEVICE KIT use to TEST BLOOD SUGAR  . chlorthalidone (HYGROTON) 25 MG tablet TAKE 1 TABLET BY MOUTH ONCE DAILY  . docusate sodium (COLACE) 100 MG capsule Take 100 mg by mouth 2 (two) times daily.  . DULoxetine (CYMBALTA) 60 MG capsule Take 1 capsule (60 mg total) by mouth daily.  Marland Kitchen gabapentin (NEURONTIN) 300 MG capsule Take 1 capsule (300 mg total) by mouth 2 (two) times daily.  . meloxicam (MOBIC) 15 MG tablet Take 1 tablet (15 mg total) by mouth daily.  . metoprolol tartrate (LOPRESSOR) 50 MG tablet TAKE 1 TABLET BY MOUTH TWICE A DAY.  . Multiple Minerals-Vitamins (CALCIUM & VIT D3 BONE HEALTH PO) Take by mouth. 600-400 mg  . nitroGLYCERIN (NITROSTAT) 0.4 MG SL tablet Place under the tongue.  . potassium chloride SA (K-DUR,KLOR-CON) 20 MEQ tablet TAKE 1 TABLET BY MOUTH ONCE DAILY  . traZODone (DESYREL) 50 MG tablet TAKE 1 TABLET BY MOUTH AT BEDTIME  . Zinc 50 MG CAPS Take 220 mg by mouth.   . [DISCONTINUED] RA SENNA 8.6 MG tablet take 1 to 2 tablets by mouth once daily    PHQ 2/9 Scores 01/06/2019 07/07/2018 06/17/2018  01/07/2018  PHQ - 2 Score 6 2 6  0  PHQ- 9 Score 15 8 24  0    Physical Exam Vitals signs and nursing note reviewed.  Constitutional:      General: She is not in acute distress.    Appearance: She is well-developed.  HENT:     Head: Normocephalic and atraumatic.     Nose: Nose normal.  Neck:     Musculoskeletal: Muscular tenderness present.  Cardiovascular:     Rate and Rhythm: Normal rate and regular rhythm.     Pulses: Normal pulses.  Pulmonary:     Effort: Pulmonary effort is normal. No respiratory distress.     Breath sounds: Normal breath sounds.  Musculoskeletal:     Cervical back: She exhibits decreased range of motion (secondary to surgery).  Skin:    General: Skin is warm and dry.     Findings: No rash.       Neurological:     Mental Status: She is alert and oriented to person, place, and time.  Psychiatric:        Attention and Perception: Attention normal.        Mood and Affect: Mood is depressed.        Speech: Speech normal.        Behavior: Behavior normal.        Thought Content: Thought content normal. Thought content does not include suicidal plan.        Judgment: Judgment normal.     BP 132/82   Pulse 69   Resp 16   Ht 5' 1"  (1.549 m)   Wt 145 lb 9.6 oz (66 kg)   SpO2 98%   BMI 27.51 kg/m   Assessment and Plan: 1. Essential hypertension controlled  2. Depression, major, recurrent, in partial remission (HCC) Continue Duloxetine and add Wellbutrin Follow up in 6 weeks Pt declines psychiatry referral at this time - buPROPion (WELLBUTRIN XL) 150 MG 24 hr tablet; Take 1 tablet (150 mg total) by mouth daily.  Dispense: 90 tablet; Refill: 1  3. Neuropathy Increase gabapentin Discuss sx with Neurosurgery - may be nerve impingement - gabapentin (NEURONTIN) 300 MG capsule; Take 2 capsules (600 mg total) by mouth 2 (two) times daily.  Dispense: 360 capsule; Refill: 1   Partially dictated using Editor, commissioning. Any errors are  unintentional.  Halina Maidens, MD [facility]  01/06/2019

## 2019-01-14 DIAGNOSIS — Z981 Arthrodesis status: Secondary | ICD-10-CM | POA: Diagnosis not present

## 2019-01-22 DIAGNOSIS — M7918 Myalgia, other site: Secondary | ICD-10-CM | POA: Insufficient documentation

## 2019-01-22 DIAGNOSIS — Z981 Arthrodesis status: Secondary | ICD-10-CM | POA: Diagnosis not present

## 2019-01-22 DIAGNOSIS — F17290 Nicotine dependence, other tobacco product, uncomplicated: Secondary | ICD-10-CM | POA: Diagnosis not present

## 2019-01-22 DIAGNOSIS — E1140 Type 2 diabetes mellitus with diabetic neuropathy, unspecified: Secondary | ICD-10-CM | POA: Diagnosis not present

## 2019-01-22 DIAGNOSIS — I2510 Atherosclerotic heart disease of native coronary artery without angina pectoris: Secondary | ICD-10-CM | POA: Diagnosis not present

## 2019-01-22 DIAGNOSIS — Z8673 Personal history of transient ischemic attack (TIA), and cerebral infarction without residual deficits: Secondary | ICD-10-CM | POA: Diagnosis not present

## 2019-02-04 DIAGNOSIS — E782 Mixed hyperlipidemia: Secondary | ICD-10-CM | POA: Diagnosis not present

## 2019-02-04 DIAGNOSIS — I2510 Atherosclerotic heart disease of native coronary artery without angina pectoris: Secondary | ICD-10-CM | POA: Diagnosis not present

## 2019-02-04 DIAGNOSIS — I10 Essential (primary) hypertension: Secondary | ICD-10-CM | POA: Diagnosis not present

## 2019-02-04 DIAGNOSIS — Z9889 Other specified postprocedural states: Secondary | ICD-10-CM | POA: Diagnosis not present

## 2019-02-04 DIAGNOSIS — F17200 Nicotine dependence, unspecified, uncomplicated: Secondary | ICD-10-CM | POA: Diagnosis not present

## 2019-02-04 DIAGNOSIS — N183 Chronic kidney disease, stage 3 (moderate): Secondary | ICD-10-CM | POA: Diagnosis not present

## 2019-02-09 ENCOUNTER — Other Ambulatory Visit: Payer: Self-pay | Admitting: Internal Medicine

## 2019-02-17 ENCOUNTER — Ambulatory Visit: Payer: Medicare HMO | Admitting: Internal Medicine

## 2019-03-01 ENCOUNTER — Other Ambulatory Visit: Payer: Self-pay | Admitting: Internal Medicine

## 2019-03-01 DIAGNOSIS — M4802 Spinal stenosis, cervical region: Secondary | ICD-10-CM

## 2019-03-01 NOTE — Telephone Encounter (Signed)
Schedule VV for tomorrow.

## 2019-03-02 ENCOUNTER — Other Ambulatory Visit: Payer: Self-pay

## 2019-03-02 ENCOUNTER — Ambulatory Visit (INDEPENDENT_AMBULATORY_CARE_PROVIDER_SITE_OTHER): Payer: Medicare HMO | Admitting: Internal Medicine

## 2019-03-02 ENCOUNTER — Encounter: Payer: Self-pay | Admitting: Internal Medicine

## 2019-03-02 DIAGNOSIS — F3341 Major depressive disorder, recurrent, in partial remission: Secondary | ICD-10-CM | POA: Diagnosis not present

## 2019-03-02 MED ORDER — BUPROPION HCL ER (XL) 150 MG PO TB24: 300.0000 mg | ORAL_TABLET | Freq: Every day | ORAL | 0 refills | Status: DC

## 2019-03-02 NOTE — Progress Notes (Signed)
Date:  03/02/2019   Name:  Betty Vaughn   DOB:  [birth year]   MRN:  [id number]  I connected with this patient, Betty Vaughn, by telephone at the patient's home.  I verified that I am speaking with the correct person using two identifiers. This visit was conducted via telephone due to the Covid-19 outbreak from my office at [facility] in [place], [place]. I discussed the limitations, risks, security and privacy concerns of performing an evaluation and management service by telephone. I also discussed with the patient that there may be a patient responsible charge related to this service. The patient expressed understanding and agreed to proceed.  Chief Complaint: Depression  Depression       The patient presents with depression.  This is a chronic problem.  The problem has been gradually improving since onset.  Associated symptoms include fatigue and suicidal ideas.  Treatments tried: recently added welbutrin to trazodone and cymbalta.  Compliance with treatment is good.  Previous treatment provided mild relief.  Risk factors include stress.   Past medical history includes recent illness, physical disability and depression.     Pertinent negatives include no suicide attempts. She thinks that the Wellbutrin has helped, she is sleeping better and may not need trazodone. She took an extra Wellbutrin one day and it seemed to help her. She denies a plan for harming herself but has had fleeting thoughts of being better off dead. She is very bothered by her disability, neck and leg pain and inability to work. She appears to have good insight into her condition.  Review of Systems  Constitutional: Positive for fatigue. Negative for chills and fever.  Respiratory: Negative for shortness of breath.   Cardiovascular: Negative for chest pain and palpitations.  Musculoskeletal: Positive for neck pain and neck stiffness.  Neurological: Negative for dizziness.  Psychiatric/Behavioral: Positive  for depression, dysphoric mood and suicidal ideas. Negative for self-injury and sleep disturbance. The patient is nervous/anxious.     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   Multiple joint complaints 05/13/2015   Depression, major, recurrent, in partial remission ([place]) 02/23/2014   Acid reflux 02/23/2014   Essential hypertension 02/23/2014   Mixed hyperlipidemia 02/23/2014   Chronic headache disorder 01/16/2012   Syrinx of spinal cord ([place]) 01/16/2012   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   CAD S/P percutaneous coronary angioplasty 03/16/2010    Allergies  Allergen Reactions   Azithromycin Swelling   Propoxyphene Anaphylaxis   Tramadol Anaphylaxis   Penicillins Swelling   Piroxicam Rash   Sertraline Hcl Rash    Past Surgical History:  Procedure Laterality Date   ABDOMINAL HYSTERECTOMY     total   CERVICAL FUSION  2005   CERVICAL FUSION  01/2018   C2-T2 posterior laminectomy and fusion; removal of previous hardware   CHOLECYSTECTOMY     COLONOSCOPY WITH PROPOFOL N/A 06/12/2017   Procedure: COLONOSCOPY WITH PROPOFOL;  Surgeon: Lucilla Lame, MD;  Location: [facility];  Service: Gastroenterology;  Laterality: N/A;   CORONARY ANGIOPLASTY  2011   "  insignifiant CAD"   HAND SURGERY Bilateral 2001   POLYPECTOMY  2000   POLYPECTOMY N/A 06/12/2017   Procedure: POLYPECTOMY;  Surgeon: Lucilla Lame, MD;  Location: [facility];  Service: Gastroenterology;  Laterality: N/A;   rigger  finger      Social History   Tobacco Use   Smoking status: Current Some Day Smoker    Packs/day: 0.20    Years: 30.00    Pack years: 6.00    Types: Cigarettes    Last attempt to quit: 07/05/2018    Years since quitting: 0.6   Smokeless tobacco: Never Used   Tobacco comment: stress has caused her to resume smoking habits. Nicoderm ordered today per Dr. [name]  Substance Use Topics   Alcohol use: Yes    Comment: occ   Drug use: No     Medication list has been reviewed and updated.  Current Meds  Medication Sig   ACCU-CHEK AVIVA PLUS test strip USE TO TEST BLOOD SUGAR TWICE DAILY   ACCU-CHEK SOFTCLIX LANCETS lancets USE TO TEST BLOOD SUGAR TWICE DAILY   albuterol (PROVENTIL HFA;VENTOLIN HFA) 108 (90 Base) MCG/ACT inhaler INHALE 2 PUFFS INTO THE LUNGS EVERY 6 HOURS AS NEEDED FOR WHEEZING OR SHORTNESS OF BREATH   amLODipine (NORVASC) 5 MG tablet TAKE 1 TABLET BY MOUTH AT BEDTIME   ASPIRIN LOW DOSE 81 MG EC tablet TAKE 1 TABLET BY MOUTH ONCE DAILY   atorvastatin (LIPITOR) 40 MG tablet TAKE 1 TABLET BY MOUTH ONCE DAILY   Blood Glucose Monitoring Suppl (ACCU-CHEK AVIVA PLUS) W/DEVICE KIT use to TEST BLOOD SUGAR   buPROPion (WELLBUTRIN XL) 150 MG 24 hr tablet Take 1 tablet (150 mg total) by mouth daily.   chlorthalidone (HYGROTON) 25 MG tablet TAKE 1 TABLET BY MOUTH ONCE DAILY   docusate sodium (COLACE) 100 MG capsule Take 100 mg by mouth 2 (two) times daily.   DULoxetine (CYMBALTA) 60 MG capsule Take 1 capsule (60 mg total) by mouth daily.   gabapentin (NEURONTIN) 300 MG capsule Take 2 capsules (600 mg total) by mouth 2 (two) times daily.   meloxicam (MOBIC) 15 MG tablet TAKE 1 TABLET(15 MG) BY MOUTH DAILY   methocarbamol (ROBAXIN) 500 MG tablet TAKE 1 TABLET BY MOUTH FOUR TIMES A DAY   metoprolol tartrate (LOPRESSOR) 50 MG tablet TAKE 1 TABLET BY MOUTH TWICE A DAY.   Multiple Minerals-Vitamins (CALCIUM & VIT D3 BONE HEALTH PO) Take by mouth. 600-400 mg    nicotine (NICODERM CQ - DOSED IN MG/24 HR) 7 mg/24hr patch Place 1 patch (7 mg total) onto the skin daily.   nitroGLYCERIN (NITROSTAT) 0.4 MG SL tablet Place under the tongue.   potassium chloride SA (K-DUR,KLOR-CON) 20 MEQ tablet TAKE 1 TABLET BY MOUTH ONCE DAILY   tamsulosin (FLOMAX) 0.4 MG CAPS capsule TAKE 1 CAPSULE BY MOUTH ONCE DAILY   traZODone (DESYREL) 50 MG tablet TAKE 1 TABLET BY MOUTH AT BEDTIME   Zinc 50 MG CAPS Take 220 mg by mouth.     PHQ 2/9 Scores 03/02/2019 01/06/2019 07/07/2018 06/17/2018  PHQ - 2 Score _0 PHQ- 9 Score _1 BP Readings from Last 3 Encounters:  01/06/19 132/82  09/08/18 124/60  07/07/18 104/68    Physical Exam Neurological:     Mental Status: She is alert.  Psychiatric:        Attention and Perception: Attention and perception normal.        Mood and Affect: Mood  is depressed.        Speech: Speech normal.        Thought Content: Thought content does not include suicidal plan.        Cognition and Memory: Cognition normal.        Judgment: Judgment normal.     Wt Readings from Last 3 Encounters:  03/02/19 145 lb (65.8 kg)  01/06/19 145 lb 9.6 oz (66 kg)  09/08/18 138 lb (62.6 kg)    Ht _0  (1.549 m)    Wt 145 lb (65.8 kg)    BMI 27.40 kg/m   Assessment and Plan: 1. Depression, major, recurrent, in partial remission (HCC) Continue Cymbalta 60 mg Increase Wellbutrin to 300 mg per day Can stop Trazodone if sleeping well Pt considering Psych referral She will call if suicidal thoughts recur - advised she can call 911, family or this clinic. Follow up 6 weeks - buPROPion (WELLBUTRIN XL) 150 MG 24 hr tablet; Take 2 tablets (300 mg total) by mouth daily.  Dispense: 180 tablet; Refill: 0   Partially dictated using Editor, commissioning. Any errors are unintentional.  Halina Maidens, MD [facility]  03/02/2019

## 2019-03-09 ENCOUNTER — Other Ambulatory Visit: Payer: Self-pay | Admitting: Internal Medicine

## 2019-03-24 ENCOUNTER — Other Ambulatory Visit: Payer: Self-pay | Admitting: Internal Medicine

## 2019-04-04 ENCOUNTER — Other Ambulatory Visit: Payer: Self-pay | Admitting: Internal Medicine

## 2019-04-14 ENCOUNTER — Other Ambulatory Visit: Payer: Self-pay | Admitting: Internal Medicine

## 2019-04-14 DIAGNOSIS — R7309 Other abnormal glucose: Secondary | ICD-10-CM

## 2019-04-26 ENCOUNTER — Other Ambulatory Visit: Payer: Self-pay | Admitting: Internal Medicine

## 2019-04-29 ENCOUNTER — Other Ambulatory Visit: Payer: Self-pay | Admitting: Internal Medicine

## 2019-05-29 ENCOUNTER — Other Ambulatory Visit: Payer: Self-pay | Admitting: Internal Medicine

## 2019-05-29 DIAGNOSIS — M4802 Spinal stenosis, cervical region: Secondary | ICD-10-CM

## 2019-05-29 DIAGNOSIS — F3341 Major depressive disorder, recurrent, in partial remission: Secondary | ICD-10-CM

## 2019-06-19 ENCOUNTER — Other Ambulatory Visit: Payer: Self-pay | Admitting: Internal Medicine

## 2019-06-19 DIAGNOSIS — R7309 Other abnormal glucose: Secondary | ICD-10-CM

## 2019-06-21 ENCOUNTER — Ambulatory Visit (INDEPENDENT_AMBULATORY_CARE_PROVIDER_SITE_OTHER): Payer: Medicare HMO

## 2019-06-21 ENCOUNTER — Other Ambulatory Visit: Payer: Self-pay

## 2019-06-21 VITALS — BP 118/82 | HR 68 | Temp 96.5°F | Resp 16 | Ht 61.0 in | Wt 154.2 lb

## 2019-06-21 DIAGNOSIS — Z599 Problem related to housing and economic circumstances, unspecified: Secondary | ICD-10-CM

## 2019-06-21 DIAGNOSIS — Z0000 Encounter for general adult medical examination without abnormal findings: Secondary | ICD-10-CM | POA: Diagnosis not present

## 2019-06-21 DIAGNOSIS — Z598 Other problems related to housing and economic circumstances: Secondary | ICD-10-CM

## 2019-06-21 DIAGNOSIS — Z1231 Encounter for screening mammogram for malignant neoplasm of breast: Secondary | ICD-10-CM

## 2019-06-21 DIAGNOSIS — Z23 Encounter for immunization: Secondary | ICD-10-CM

## 2019-06-21 DIAGNOSIS — F4321 Adjustment disorder with depressed mood: Secondary | ICD-10-CM

## 2019-06-21 NOTE — Progress Notes (Signed)
Subjective:   Betty Vaughn is a 68 y.o. female who presents for Medicare Annual (Subsequent) preventive examination.  Review of Systems:   Cardiac Risk Factors include: advanced age (>72mn, >>89women);diabetes mellitus;dyslipidemia;hypertension;smoking/ tobacco exposure     Objective:     Vitals: BP 118/82 (BP Location: Right Arm, Patient Position: Sitting, Cuff Size: Normal)    Pulse 68    Temp (!) 96.5 F (35.8 C) (Temporal)    Resp 16    Ht 5' 1"  (1.549 m)    Wt 154 lb 3.2 oz (69.9 kg)    BMI 29.14 kg/m   Body mass index is 29.14 kg/m.  Advanced Directives 06/21/2019 06/17/2018 06/12/2017 11/10/2016  Does Patient Have a Medical Advance Directive? No No No No  Would patient like information on creating a medical advance directive? Yes (MAU/Ambulatory/Procedural Areas - Information given) Yes (MAU/Ambulatory/Procedural Areas - Information given) No - Patient declined -    Tobacco Social History   Tobacco Use  Smoking Status Current Some Day Smoker   Packs/day: 0.25   Years: 30.00   Pack years: 7.50   Types: Cigarettes   Last attempt to quit: 07/05/2018   Years since quitting: 0.9  Smokeless Tobacco Never Used  Tobacco Comment   stress has caused her to resume smoking habits. Nicoderm ordered today per Dr. BArmy Melia    Ready to quit: Not Answered Counseling given: Not Answered Comment: stress has caused her to resume smoking habits. Nicoderm ordered today per Dr. BArmy Melia  Clinical Intake:  Pre-visit preparation completed: Yes  Pain : No/denies pain     BMI - recorded: 29.14 Nutritional Status: BMI 25 -29 Overweight Nutritional Risks: None Diabetes: Yes CBG done?: No Did pt. bring in CBG monitor from home?: No   Nutrition Risk Assessment:  Has the patient had any N/V/D within the last 2 months?  No  Does the patient have any non-healing wounds?  No  Has the patient had any unintentional weight loss or weight gain?  No   Diabetes:  Is the  patient diabetic?  Yes  If diabetic, was a CBG obtained today?  No  Did the patient bring in their glucometer from home?  No  How often do you monitor your CBG's? Pt not actively checking blood sugar, awaiting new meter that she plans to pick up at pharmacy.   Financial Strains and Diabetes Management:  Are you having any financial strains with the device, your supplies or your medication? No .  Does the patient want to be seen by Chronic Care Management for management of their diabetes?  No  Would the patient like to be referred to a Nutritionist or for Diabetic Management?  No   Diabetic Exams:  Diabetic Eye Exam: Completed 01/22/18. Overdue for diabetic eye exam. Pt has been advised about the importance in completing this exam.   Diabetic Foot Exam:  Pt has been advised about the importance in completing this exam. Pt is scheduled for diabetic foot exam on 09/08/19.   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   Depression    Diabetes mellitus without complication ([facility]  GERD (gastroesophageal reflux disease)    Hx of colonic polyps    Hyperlipidemia    Hypertension    Neuromuscular disorder (HCC)    Psoriasis    scalp   Past Surgical History:  Procedure Laterality Date   ABDOMINAL HYSTERECTOMY     total   CERVICAL FUSION  2005   CERVICAL FUSION  01/2018   C2-T2 posterior laminectomy and fusion; removal of previous hardware   CHOLECYSTECTOMY     COLONOSCOPY WITH PROPOFOL N/A 06/12/2017   Procedure: COLONOSCOPY WITH PROPOFOL;  Surgeon: Lucilla Lame, MD;  Location: [facility];  Service: Gastroenterology;  Laterality: N/A;   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"   HAND SURGERY Bilateral 2001   POLYPECTOMY  2000   POLYPECTOMY N/A 06/12/2017   Procedure: POLYPECTOMY;  Surgeon: Lucilla Lame, MD;  Location: [facility];  Service: Gastroenterology;  Laterality: N/A;   rigger finger     Family History  Problem Relation Age of Onset   Breast cancer Mother 58   Diabetes Mother    CAD Father    Heart attack Father    Diabetes Daughter    Kidney failure Daughter    Breast cancer Sister 35   Sickle cell anemia Other    Cancer Brother        lung   Heart attack Sister    Cancer Sister        ovarian   Kidney cancer Neg Hx    Bladder Cancer Neg Hx    Social History   Socioeconomic History   Marital status: Divorced    Spouse name: Not on file   Number of children: 3   Years of education: Not on file   Highest education level: 12th grade  Occupational History   Occupation: Retired  Scientist, product/process development strain: Very hard   Food insecurity    Worry: Sometimes true    Inability: Sometimes true   Transportation needs    Medical: No    Non-medical: No  Tobacco Use   Smoking status: Current Some Day Smoker    Packs/day: 0.25    Years: 30.00    Pack years: 7.50    Types: Cigarettes    Last attempt to quit: 07/05/2018    Years since quitting: 0.9   Smokeless tobacco: Never Used   Tobacco comment: stress has caused her to resume smoking habits. Nicoderm ordered today per Dr. [name]  Substance and Sexual Activity   Alcohol use: Yes    Comment: occ   Drug use: No   Sexual activity: Not Currently  Lifestyle   Physical activity    Days per week: 0 days    Minutes per session: 0 min   Stress: Very much  Relationships   Social connections    Talks on phone: More than three times a week    Gets together: Once a week    Attends religious service: More than 4 times per year    Active member of club or organization: No    Attends meetings of  clubs or organizations: Never    Relationship status: Divorced  Other Topics Concern   Not on file  Social History Narrative   Not on file    Outpatient Encounter Medications as of 06/21/2019  Medication Sig   Accu-Chek FastClix Lancets MISC USE TO TEST BLOOD SUGAR TWICE DAILY   ACCU-CHEK GUIDE test strip USE TO TEST BLOOD SUGAR TWICE DAILY   amLODipine ([place]) 5  MG tablet TAKE 1 TABLET BY MOUTH AT BEDTIME   ASPIRIN LOW DOSE 81 MG EC tablet TAKE 1 TABLET BY MOUTH EVERY DAY   atorvastatin (LIPITOR) 40 MG tablet TAKE 1 TABLET BY MOUTH EVERY DAY   Blood Glucose Monitoring Suppl (ACCU-CHEK GUIDE ME) w/Device KIT TO TEST TWICE DAILY   buPROPion (WELLBUTRIN XL) 150 MG 24 hr tablet TAKE 2 TABLETS(300 MG) BY MOUTH DAILY   Calcium Carbonate-Vitamin D 600-400 MG-UNIT tablet TAKE 1 TABLET BY MOUTH EVERY DAY   chlorthalidone (HYGROTON) 25 MG tablet TAKE 1 TABLET BY MOUTH EVERY DAY   DULoxetine (CYMBALTA) 60 MG capsule TAKE 1 CAPSULE(60 MG) BY MOUTH DAILY   gabapentin (NEURONTIN) 300 MG capsule Take 2 capsules (600 mg total) by mouth 2 (two) times daily.   meloxicam (MOBIC) 15 MG tablet TAKE 1 TABLET(15 MG) BY MOUTH DAILY   methocarbamol (ROBAXIN) 500 MG tablet TAKE 1 TABLET BY MOUTH FOUR TIMES DAILY   metoprolol tartrate (LOPRESSOR) 50 MG tablet TAKE 1 TABLET BY MOUTH TWICE A DAY.   Multiple Minerals-Vitamins (CALCIUM & VIT D3 BONE HEALTH PO) Take by mouth. 600-400 mg   potassium chloride SA (K-DUR) 20 MEQ tablet TAKE 1 TABLET BY MOUTH EVERY DAY   tamsulosin (FLOMAX) 0.4 MG CAPS capsule TAKE 1 CAPSULE BY MOUTH ONCE DAILY   traZODone (DESYREL) 50 MG tablet TAKE 1 TABLET BY MOUTH AT BEDTIME   VENTOLIN HFA 108 (90 Base) MCG/ACT inhaler INHALE 2 PUFFS INTO THE LUNGS EVERY 6 HOURS AS NEEDED FOR WHEEZING OR SHORTNESS OF BREATH   nicotine (NICODERM CQ - DOSED IN MG/24 HR) 7 mg/24hr patch Place 1 patch (7 mg total) onto the skin daily. (Patient not taking: Reported on 06/21/2019)    nitroGLYCERIN (NITROSTAT) 0.4 MG SL tablet Place under the tongue.   Zinc 50 MG CAPS Take 220 mg by mouth.    [DISCONTINUED] docusate sodium (COLACE) 100 MG capsule Take 100 mg by mouth 2 (two) times daily.   No facility-administered encounter medications on file as of 06/21/2019.     Activities of Daily Living In your present state of health, do you have any difficulty performing the following activities: 06/21/2019  Hearing? N  Comment declines hearing aids  Vision? N  Difficulty concentrating or making decisions? N  Walking or climbing stairs? N  Dressing or bathing? N  Doing errands, shopping? N  Preparing Food and eating ? N  Using the Toilet? N  In the past six months, have you accidently leaked urine? N  Do you have problems with loss of bowel control? N  Managing your Medications? N  Managing your Finances? N  Housekeeping or managing your Housekeeping? N  Some recent data might be hidden    Patient Care Team: Glean Hess, MD as PCP - General (Internal Medicine) Bayard Hugger, MD as Consulting Physician (Neurosurgery) Laneta Simmers as Physician Assistant (Urology)    Assessment:   This is a routine wellness examination for Maelynn.  Exercise Activities and Dietary recommendations Current Exercise Habits: The patient does not participate in regular exercise at present, Exercise limited by: orthopedic condition(s)  Goals     DIET - INCREASE WATER INTAKE     Recommend to drink at least 6-8 8oz glasses of water per day.       Fall Risk Fall Risk  06/21/2019 03/02/2019 01/06/2019 07/07/2018 06/17/2018  Falls in the past year? 1 0 0 No No  Number falls in past yr: 1 0 - - -  Injury with Fall? 0 0 0 - -  Risk for fall due to : Impaired balance/gait - - - Impaired vision;Impaired balance/gait;History of fall(s);Medication side effect;Other (Comment)  Risk for fall due to: Comment - - - - wears eyeglasses; cervical fusion, ambulates with cane; dizziness    Follow up Falls prevention discussed Falls evaluation completed - - -   FALL RISK PREVENTION PERTAINING TO THE HOME:  Any stairs in or around the home? Yes  If so, do they handrails? Yes   Home free of loose throw rugs in walkways, pet beds, electrical cords, etc? Yes  Adequate lighting in your home to reduce risk of falls? Yes   ASSISTIVE DEVICES UTILIZED TO PREVENT FALLS:  Life alert? No  Use of a cane, walker or w/c? Yes  Grab bars in the bathroom? No  Shower chair or bench in shower? No  Elevated toilet seat or a handicapped toilet? No   DME ORDERS:  DME order needed?  No   TIMED UP AND GO:  Was the test performed? Yes .  Length of time to ambulate 10 feet: 6 sec.   GAIT:  Appearance of gait: Gait stead-fast and without the use of an assistive device.   Education: Fall risk prevention has been discussed.  Intervention(s) required? No   Depression Screen PHQ 2/9 Scores 06/21/2019 03/02/2019 01/06/2019 07/07/2018  PHQ - 2 Score 2 5 6 2   PHQ- 9 Score 11 16 15 8      Cognitive Function - pt declined 6CIT for 2020 AWV     6CIT Screen 04/28/2017  What Year? 0 points  What month? 0 points  What time? 0 points  Count back from 20 0 points  Months in reverse 0 points  Repeat phrase 0 points  Total Score 0    Immunization History  Administered Date(s) Administered   H1N1 07/31/2017   Influenza, High Dose Seasonal PF 08/29/2017, 09/22/2018   Influenza-Unspecified 08/29/2017   PPD Test 06/19/2015   Pneumococcal Conjugate-13 06/17/2018   Tdap 04/11/2016   Zoster 06/12/2016    Qualifies for Shingles Vaccine? Yes  Zostavax completed 2017. Due for Shingrix. Education has been provided regarding the importance of this vaccine. Pt has been advised to call insurance company to determine out of pocket expense. Advised may also receive vaccine at local pharmacy or Health Dept. Verbalized acceptance and understanding.  Tdap: Up to date  Flu Vaccine: Up to  date  Pneumococcal Vaccine: Due for Pneumococcal vaccine. Does the patient want to receive this vaccine today?  Yes . Education has been provided regarding the importance of this vaccine but still declined. Advised may receive this vaccine at local pharmacy or Health Dept. Aware to provide a copy of the vaccination record if obtained from local pharmacy or Health Dept. Verbalized acceptance and understanding.   Screening Tests Health Maintenance  Topic Date Due   URINE MICROALBUMIN  09/19/1961   PNA vac Low Risk Adult (2 of 2 - PPSV23) 06/18/2019   INFLUENZA VACCINE  06/26/2019   MAMMOGRAM  09/24/2019   COLONOSCOPY  06/12/2022   TETANUS/TDAP  04/11/2026   DEXA SCAN  Completed   Hepatitis C Screening  Completed    Cancer Screenings:  Colorectal Screening: Completed 06/12/17. Repeat every 5 years;   Mammogram: Completed 09/23/18. Repeat every year;   Bone Density: Completed 09/23/18. Results reflect OSTEOPENIA. Repeat every 2 years.   Lung Cancer Screening: (Low Dose CT Chest recommended if Age 26-80 years, 30 pack-year currently smoking OR have quit  w/in 15years.) does not qualify.   Additional Screening:  Hepatitis C Screening: does qualify; Completed 04/10/16  Vision Screening: Recommended annual ophthalmology exams for early detection of glaucoma and other disorders of the eye. Is the patient up to date with their annual eye exam?  Yes  Who is the provider or what is the name of the office in which the pt attends annual eye exams? [facility] Screening: Recommended annual dental exams for proper oral hygiene  Community Resource Referral:  CRR required this visit?  YES - financial difficulties, see referral details.      Plan:     I have personally reviewed and addressed the Medicare Annual Wellness questionnaire and have noted the following in the patients chart:  A. Medical and social history B. Use of alcohol, tobacco or illicit drugs   C. Current medications and supplements D. Functional ability and status E.  Nutritional status F.  Physical activity G. Advance directives H. List of other physicians I.  Hospitalizations, surgeries, and ER visits in previous 12 months J.  [place] such as hearing and vision if needed, cognitive and depression L. Referrals and appointments   In addition, I have reviewed and discussed with patient certain preventive protocols, quality metrics, and best practice recommendations. A written personalized care plan for preventive services as well as general preventive health recommendations were provided to patient.   Signed,  Clemetine Marker, LPN Nurse Health Advisor   Nurse Notes: pt presented today for AWV with several concerns including weight gain, constipation, pain in left side of head, gait off balance, depression and skin irritation specific to site on left lower back/hip that she has had removed in the past. Advised patient to discuss with Dr. [name] and schedule an appt for earlier than CPE scheduled for Oct 2020. Pt states she has tried multiple products for contstipation and bloating and does not have regular bowel movements and often required use of milk of magnesia stating that colace and enemas did not provide any relief. She is worried about her stomach "blowing up" the way her mother's did because her mother supposedly had a 14 lb tumor (unknown type). This month also marks the one year anniversary of the death of her brother which was the only sibling she was close to and being in quarantine has not helped depression sxs. PHQ9 11 today. C3 referral sent for financial difficulties and grief counseling. Patient may need derm referral for skin problem.

## 2019-06-21 NOTE — Patient Instructions (Signed)
[name] , Thank you for taking time to come for your Medicare Wellness Visit. I appreciate your ongoing commitment to your health goals. Please review the following plan we discussed and let me know if I can assist you in the future.   Screening recommendations/referrals: Colonoscopy: done 06/12/17. Repeat in 2023. Mammogram: done 09/23/18. Please call [phone] to schedule your mammogram.  Bone Density: done 09/23/18 Recommended yearly ophthalmology/optometry visit for glaucoma screening and checkup Recommended yearly dental visit for hygiene and checkup  Vaccinations: Influenza vaccine: done 09/22/18 Pneumococcal vaccine: done today Tdap vaccine: done 04/11/16 Shingles vaccine: Shingrix discussed. Please contact your pharmacy for coverage information.   Advanced directives: Advance directive discussed with you today. I have provided a copy for you to complete at home and have notarized. Once this is complete please bring a copy in to our office so we can scan it into your chart.  Conditions/risks identified: recommend drinking 6-8 glasses of water per day  Next appointment: Please follow up in one year for your Medicare Annual Wellness visit.     Preventive Care 14 Years and Older, Female Preventive care refers to lifestyle choices and visits with your health care provider that can promote health and wellness. What does preventive care include?  A yearly physical exam. This is also called an annual well check.  Dental exams once or twice a year.  Routine eye exams. Ask your health care provider how often you should have your eyes checked.  Personal lifestyle choices, including:  Daily care of your teeth and gums.  Regular physical activity.  Eating a healthy diet.  Avoiding tobacco and drug use.  Limiting alcohol use.  Practicing safe sex.  Taking low-dose aspirin every day.  Taking vitamin and mineral supplements as recommended by your health care provider.  What happens during an annual well check? The services and screenings done by your health care provider during your annual well check will depend on your age, overall health, lifestyle risk factors, and family history of disease. Counseling  Your health care provider may ask you questions about your:  Alcohol use.  Tobacco use.  Drug use.  Emotional well-being.  Home and relationship well-being.  Sexual activity.  Eating habits.  History of falls.  Memory and ability to understand (cognition).  Work and work Statistician.  Reproductive health. Screening  You may have the following tests or measurements:  Height, weight, and BMI.  Blood pressure.  Lipid and cholesterol levels. These may be checked every 5 years, or more frequently if you are over 87 years old.  Skin check.  Lung cancer screening. You may have this screening every year starting at age 62 if you have a 30-pack-year history of smoking and currently smoke or have quit within the past 15 years.  Fecal occult blood test (FOBT) of the stool. You may have this test every year starting at age 66.  Flexible sigmoidoscopy or colonoscopy. You may have a sigmoidoscopy every 5 years or a colonoscopy every 10 years starting at age 103.  Hepatitis C blood test.  Hepatitis B blood test.  Sexually transmitted disease (STD) testing.  Diabetes screening. This is done by checking your blood sugar (glucose) after you have not eaten for a while (fasting). You may have this done every 1-3 years.  Bone density scan. This is done to screen for osteoporosis. You may have this done starting at age 45.  Mammogram. This may be done every 1-2 years. Talk to your health care provider  about how often you should have regular mammograms. Talk with your health care provider about your test results, treatment options, and if necessary, the need for more tests. Vaccines  Your health care provider may recommend certain vaccines, such  as:  Influenza vaccine. This is recommended every year.  Tetanus, diphtheria, and acellular pertussis (Tdap, Td) vaccine. You may need a Td booster every 10 years.  Zoster vaccine. You may need this after age 69.  Pneumococcal 13-valent conjugate (PCV13) vaccine. One dose is recommended after age 55.  Pneumococcal polysaccharide (PPSV23) vaccine. One dose is recommended after age 55. Talk to your health care provider about which screenings and vaccines you need and how often you need them. This information is not intended to replace advice given to you by your health care provider. Make sure you discuss any questions you have with your health care provider. Document Released: 12/08/2015 Document Revised: 07/31/2016 Document Reviewed: 09/12/2015 Elsevier Interactive Patient Education  2017 [place] Prevention in the Home Falls can cause injuries. They can happen to people of all ages. There are many things you can do to make your home safe and to help prevent falls. What can I do on the outside of my home?  Regularly fix the edges of walkways and driveways and fix any cracks.  Remove anything that might make you trip as you walk through a door, such as a raised step or threshold.  Trim any bushes or trees on the path to your home.  Use bright outdoor lighting.  Clear any walking paths of anything that might make someone trip, such as rocks or tools.  Regularly check to see if handrails are loose or broken. Make sure that both sides of any steps have handrails.  Any raised decks and porches should have guardrails on the edges.  Have any leaves, snow, or ice cleared regularly.  Use sand or salt on walking paths during winter.  Clean up any spills in your garage right away. This includes oil or grease spills. What can I do in the bathroom?  Use night lights.  Install grab bars by the toilet and in the tub and shower. Do not use towel bars as grab bars.  Use  non-skid mats or decals in the tub or shower.  If you need to sit down in the shower, use a plastic, non-slip stool.  Keep the floor dry. Clean up any water that spills on the floor as soon as it happens.  Remove soap buildup in the tub or shower regularly.  Attach bath mats securely with double-sided non-slip rug tape.  Do not have throw rugs and other things on the floor that can make you trip. What can I do in the bedroom?  Use night lights.  Make sure that you have a light by your bed that is easy to reach.  Do not use any sheets or blankets that are too big for your bed. They should not hang down onto the floor.  Have a firm chair that has side arms. You can use this for support while you get dressed.  Do not have throw rugs and other things on the floor that can make you trip. What can I do in the kitchen?  Clean up any spills right away.  Avoid walking on wet floors.  Keep items that you use a lot in easy-to-reach places.  If you need to reach something above you, use a strong step stool that has a grab bar.  Keep electrical cords out of the way.  Do not use floor polish or wax that makes floors slippery. If you must use wax, use non-skid floor wax.  Do not have throw rugs and other things on the floor that can make you trip. What can I do with my stairs?  Do not leave any items on the stairs.  Make sure that there are handrails on both sides of the stairs and use them. Fix handrails that are broken or loose. Make sure that handrails are as long as the stairways.  Check any carpeting to make sure that it is firmly attached to the stairs. Fix any carpet that is loose or worn.  Avoid having throw rugs at the top or bottom of the stairs. If you do have throw rugs, attach them to the floor with carpet tape.  Make sure that you have a light switch at the top of the stairs and the bottom of the stairs. If you do not have them, ask someone to add them for you. What  else can I do to help prevent falls?  Wear shoes that:  Do not have high heels.  Have rubber bottoms.  Are comfortable and fit you well.  Are closed at the toe. Do not wear sandals.  If you use a stepladder:  Make sure that it is fully opened. Do not climb a closed stepladder.  Make sure that both sides of the stepladder are locked into place.  Ask someone to hold it for you, if possible.  Clearly mark and make sure that you can see:  Any grab bars or handrails.  First and last steps.  Where the edge of each step is.  Use tools that help you move around (mobility aids) if they are needed. These include:  Canes.  Walkers.  Scooters.  Crutches.  Turn on the lights when you go into a dark area. Replace any light bulbs as soon as they burn out.  Set up your furniture so you have a clear path. Avoid moving your furniture around.  If any of your floors are uneven, fix them.  If there are any pets around you, be aware of where they are.  Review your medicines with your doctor. Some medicines can make you feel dizzy. This can increase your chance of falling. Ask your doctor what other things that you can do to help prevent falls. This information is not intended to replace advice given to you by your health care provider. Make sure you discuss any questions you have with your health care provider. Document Released: 09/07/2009 Document Revised: 04/18/2016 Document Reviewed: 12/16/2014 Elsevier Interactive Patient Education  2017 [employer].

## 2019-06-22 ENCOUNTER — Telehealth: Payer: Self-pay

## 2019-06-22 NOTE — Telephone Encounter (Signed)
06/22/2019 Spoke with patient about community food banks, support groups and utility assistance. Human resources officer to [employer] to  mail to patient. Ambrose Mantle [phone]

## 2019-06-29 ENCOUNTER — Other Ambulatory Visit: Payer: Self-pay | Admitting: Internal Medicine

## 2019-06-29 ENCOUNTER — Telehealth: Payer: Self-pay

## 2019-06-29 NOTE — Telephone Encounter (Signed)
06/29/2019 Followed-up with patient regarding grief support groups.  Patient stated she will contact them this week. She does not have any other needs at present. Ambrose Mantle [phone]

## 2019-07-28 ENCOUNTER — Other Ambulatory Visit: Payer: Self-pay | Admitting: Internal Medicine

## 2019-08-27 ENCOUNTER — Other Ambulatory Visit: Payer: Self-pay | Admitting: Internal Medicine

## 2019-08-27 DIAGNOSIS — F3341 Major depressive disorder, recurrent, in partial remission: Secondary | ICD-10-CM

## 2019-08-27 DIAGNOSIS — M4802 Spinal stenosis, cervical region: Secondary | ICD-10-CM

## 2019-09-03 ENCOUNTER — Other Ambulatory Visit: Payer: Self-pay

## 2019-09-03 ENCOUNTER — Ambulatory Visit (INDEPENDENT_AMBULATORY_CARE_PROVIDER_SITE_OTHER): Payer: Medicare HMO | Admitting: Internal Medicine

## 2019-09-03 ENCOUNTER — Encounter: Payer: Self-pay | Admitting: Internal Medicine

## 2019-09-03 ENCOUNTER — Ambulatory Visit: Admission: RE | Admit: 2019-09-03 | Payer: Medicare HMO | Source: Home / Self Care

## 2019-09-03 ENCOUNTER — Ambulatory Visit
Admission: RE | Admit: 2019-09-03 | Discharge: 2019-09-03 | Disposition: A | Discharge status: Home / Self Care | Payer: Medicare HMO | Source: Ambulatory Visit | Attending: Internal Medicine | Admitting: Internal Medicine

## 2019-09-03 ENCOUNTER — Other Ambulatory Visit: Payer: Self-pay | Admitting: Internal Medicine

## 2019-09-03 VITALS — BP 134/80 | HR 71 | Ht 61.0 in | Wt 156.0 lb

## 2019-09-03 DIAGNOSIS — M7918 Myalgia, other site: Secondary | ICD-10-CM | POA: Diagnosis not present

## 2019-09-03 DIAGNOSIS — R7303 Prediabetes: Secondary | ICD-10-CM | POA: Diagnosis not present

## 2019-09-03 DIAGNOSIS — N644 Mastodynia: Secondary | ICD-10-CM

## 2019-09-03 DIAGNOSIS — F17200 Nicotine dependence, unspecified, uncomplicated: Secondary | ICD-10-CM

## 2019-09-03 DIAGNOSIS — R079 Chest pain, unspecified: Secondary | ICD-10-CM | POA: Diagnosis not present

## 2019-09-03 LAB — POCT URINALYSIS DIPSTICK
Bilirubin, UA: NEGATIVE
Blood, UA: NEGATIVE
Glucose, UA: NEGATIVE
Ketones, UA: NEGATIVE
Leukocytes, UA: NEGATIVE
Nitrite, UA: NEGATIVE
Protein, UA: NEGATIVE
Spec Grav, UA: 1.015 (ref 1.010–1.025)
Urobilinogen, UA: 0.2 E.U./dL
pH, UA: 6.5 (ref 5.0–8.0)

## 2019-09-03 NOTE — Progress Notes (Signed)
Date:  09/03/2019   Name:  Betty Vaughn   DOB:  1951/02/22   MRN:  [id number]   Chief Complaint: Breast Pain (Left breast pain moves back into left shoulder blade. 2 months- getting worse.), Back Pain (Lower back pain that radiates into thighs. Feels off balance when back starts hurting. 2 months ago. Pain when urinating. Urology has already seen her for this. ), and Diabetes (Micro.)  Dysuria  This is a chronic problem. The current episode started more than 1 month ago. The problem has been resolved. The quality of the pain is described as burning. The pain is mild. There has been no fever. Associated symptoms include flank pain. Pertinent negatives include no chills, hematuria or urgency. She has tried nothing for the symptoms.  Breast pain - she has pain in her left upper breast that she says is painful to touch with no injury.  It feels as if the pain radiates to her mid back beside and over her shoulder blade.  The has been on definite injury although she had severe chronic neck pain and spasm and has stumbled against the wall at times due to gait imbalance.  She is due for a mammogram.  She denies skin change, mass or nipple discharge.  Review of Systems  Constitutional: Negative for chills, fatigue and fever.  Respiratory: Negative for cough, chest tightness, shortness of breath and wheezing.   Cardiovascular: Negative for chest pain, palpitations and leg swelling.  Genitourinary: Positive for dysuria and flank pain. Negative for hematuria and urgency.    Patient Active Problem List   Diagnosis Date Noted  . Myofascial muscle pain 01/22/2019  . Weakness of both arms 09/08/2018  . Renal insufficiency 09/08/2018  . 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 Cervical stenosis of spinal canal 01/14/2018  . Personal history of colonic polyps   . Rectal polyp   . Tobacco use disorder 11/20/2016  . Neuropathy 04/10/2016  . Glucose intolerance (impaired glucose  tolerance) 05/13/2015  . Benign essential tremor 05/13/2015  . Chronic insomnia 05/13/2015  . 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 Multiple joint complaints 05/13/2015  . Depression, major, recurrent, in partial remission ([place]) 02/23/2014  . Acid reflux 02/23/2014  . Essential hypertension 02/23/2014  . Mixed hyperlipidemia 02/23/2014  . Chronic headache disorder 01/16/2012  . Syrinx of spinal cord ([place]) 01/16/2012  . 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 CAD S/P percutaneous coronary angioplasty 03/16/2010    Allergies  Allergen Reactions  . Azithromycin Swelling  . Propoxyphene Anaphylaxis  . Tramadol Anaphylaxis  . Penicillins Swelling  . Piroxicam Rash  . Sertraline Hcl Rash    Past Surgical History:  Procedure Laterality Date  . ABDOMINAL HYSTERECTOMY     total  . CERVICAL FUSION  2005  . CERVICAL FUSION  01/2018   C2-T2 posterior laminectomy and fusion; removal of previous hardware  . CHOLECYSTECTOMY    . COLONOSCOPY WITH PROPOFOL N/A 06/12/2017   Procedure: COLONOSCOPY WITH PROPOFOL;  Surgeon: Lucilla Lame, MD;  Location: [place];  Service: Gastroenterology;  Laterality: N/A;  . 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"  . HAND SURGERY Bilateral 2001  . POLYPECTOMY  2000  . POLYPECTOMY N/A 06/12/2017   Procedure: POLYPECTOMY;  Surgeon: Lucilla Lame, MD;  Location: [place];  Service: Gastroenterology;  Laterality: N/A;  . rigger finger      Social History   Tobacco Use  . Smoking status: Former Smoker    Packs/day: 0.25  Years: 30.00    Pack years: 7.50    Types: Cigarettes    Quit date: 07/27/2019    Years since quitting: 0.1  . Smokeless tobacco: Never Used  Substance Use Topics  . Alcohol use: Yes    Comment: occ  . Drug use: No     Medication list has been reviewed and updated.  Current Meds  Medication Sig  . Accu-Chek FastClix Lancets MISC USE TO TEST BLOOD SUGAR TWICE DAILY  . ACCU-CHEK GUIDE test strip USE TO TEST  BLOOD SUGAR TWICE DAILY  . amLODipine (NORVASC) 5 MG tablet TAKE 1 TABLET BY MOUTH AT BEDTIME  . ASPIRIN LOW DOSE 81 MG EC tablet TAKE 1 TABLET BY MOUTH EVERY DAY  . atorvastatin (LIPITOR) 40 MG tablet TAKE 1 TABLET BY MOUTH EVERY DAY  . Blood Glucose Monitoring Suppl (ACCU-CHEK GUIDE ME) w/Device KIT TO TEST TWICE DAILY  . buPROPion (WELLBUTRIN XL) 150 MG 24 hr tablet TAKE 2 TABLETS(300 MG) BY MOUTH DAILY  . Calcium Carbonate-Vitamin D 600-400 MG-UNIT tablet TAKE 1 TABLET BY MOUTH EVERY DAY  . chlorthalidone (HYGROTON) 25 MG tablet TAKE 1 TABLET BY MOUTH EVERY DAY  . DULoxetine (CYMBALTA) 60 MG capsule TAKE 1 CAPSULE(60 MG) BY MOUTH DAILY  . gabapentin (NEURONTIN) 300 MG capsule Take 2 capsules (600 mg total) by mouth 2 (two) times daily.  . meloxicam (MOBIC) 15 MG tablet TAKE 1 TABLET(15 MG) BY MOUTH DAILY  . methocarbamol (ROBAXIN) 500 MG tablet TAKE 1 TABLET BY MOUTH FOUR TIMES DAILY  . metoprolol tartrate (LOPRESSOR) 50 MG tablet TAKE 1 TABLET BY MOUTH TWICE A DAY.  . Multiple Minerals-Vitamins (CALCIUM & VIT D3 BONE HEALTH PO) Take by mouth. 600-400 mg  . nicotine (NICODERM CQ - DOSED IN MG/24 HR) 7 mg/24hr patch Place 1 patch (7 mg total) onto the skin daily.  . nitroGLYCERIN (NITROSTAT) 0.4 MG SL tablet Place under the tongue.  . potassium chloride SA (K-DUR) 20 MEQ tablet TAKE 1 TABLET BY MOUTH EVERY DAY  . tamsulosin (FLOMAX) 0.4 MG CAPS capsule TAKE 1 CAPSULE BY MOUTH EVERY DAY  . traZODone (DESYREL) 50 MG tablet TAKE 1 TABLET BY MOUTH AT BEDTIME  . VENTOLIN HFA 108 (90 Base) MCG/ACT inhaler INHALE 2 PUFFS INTO THE LUNGS EVERY 6 HOURS AS NEEDED FOR WHEEZING OR SHORTNESS OF BREATH  . Zinc 50 MG CAPS Take 220 mg by mouth.     PHQ 2/9 Scores 09/03/2019 06/21/2019 03/02/2019 01/06/2019  PHQ - 2 Score 0 _0 PHQ- 9 Score - _1 BP Readings from Last 3 Encounters:  09/03/19 134/80  06/21/19 118/82  01/06/19 132/82    Physical Exam Vitals signs and nursing note reviewed.   Constitutional:      General: She is not in acute distress.    Appearance: Normal appearance. She is well-developed.  HENT:     Head: Normocephalic and atraumatic.  Cardiovascular:     Rate and Rhythm: Normal rate and regular rhythm.     Heart sounds: No murmur.  Pulmonary:     Effort: Pulmonary effort is normal. No respiratory distress.     Breath sounds: Normal breath sounds.  Chest:     Chest wall: Mass and tenderness present. No edema. There is no dullness to percussion.     Breasts:        Right: Normal.        Left: Tenderness (upper left breast) present.    Abdominal:  Palpations: Abdomen is soft.     Tenderness: There is no abdominal tenderness.  Musculoskeletal:     Cervical back: She exhibits decreased range of motion, bony tenderness and spasm.     Comments: Spasm of muscles along the left paraspinous area with tenderness to palpation Neck area very tender with marked decreased in ROM due to pain  Lymphadenopathy:     Cervical: No cervical adenopathy.  Skin:    General: Skin is warm and dry.     Findings: No rash.  Neurological:     Mental Status: She is alert and oriented to person, place, and time.  Psychiatric:        Behavior: Behavior normal.        Thought Content: Thought content normal.     Wt Readings from Last 3 Encounters:  09/03/19 156 lb (70.8 kg)  06/21/19 154 lb 3.2 oz (69.9 kg)  03/02/19 145 lb (65.8 kg)    BP 134/80   Pulse 71   Ht _0  (1.549 m)   Wt 156 lb (70.8 kg)   SpO2 98%   BMI 29.48 kg/m   Assessment and Plan: 1. Breast pain, left Refer for Dx mammogram - MM DIAG BREAST TOMO UNI LEFT; Future - US BREAST LTD UNI LEFT INC AXILLA; Future - US BREAST LTD UNI RIGHT INC AXILLA; Future  2. Myofascial muscle pain Suspect breast and back pain are due to spasm from failed cervical spine surgery She will continue robaxin, gabapentin, heat/ice Will get CXR to rule out pulmonary/chest wall abnormality - DG Chest 2 View; Future   3. Pre-diabetes UA is negative for infection Will send for microalb - POCT Urinalysis Dipstick - Microalbumin / creatinine urine ratio  4. Tobacco use disorder Pt congratulated on quitting She is taking bupropion for mood disorder and I encouraged her to continue this for at least 3 more months. If she is still not smoking but is doing well from a mood standpoint, then can consider stopping.  Partially dictated using Editor, commissioning. Any errors are unintentional.  Halina Maidens, MD [facility]  09/03/2019

## 2019-09-06 DIAGNOSIS — E782 Mixed hyperlipidemia: Secondary | ICD-10-CM | POA: Diagnosis not present

## 2019-09-06 DIAGNOSIS — Z9889 Other specified postprocedural states: Secondary | ICD-10-CM | POA: Diagnosis not present

## 2019-09-06 DIAGNOSIS — I10 Essential (primary) hypertension: Secondary | ICD-10-CM | POA: Diagnosis not present

## 2019-09-06 DIAGNOSIS — I2510 Atherosclerotic heart disease of native coronary artery without angina pectoris: Secondary | ICD-10-CM | POA: Diagnosis not present

## 2019-09-06 LAB — MICROALBUMIN / CREATININE URINE RATIO
Creatinine, Urine: 48 mg/dL
Microalb/Creat Ratio: <6 mg/g creat (ref 0–29)
Microalbumin, Urine: <3 ug/mL

## 2019-09-08 ENCOUNTER — Encounter: Payer: Self-pay | Admitting: Internal Medicine

## 2019-09-08 ENCOUNTER — Ambulatory Visit (INDEPENDENT_AMBULATORY_CARE_PROVIDER_SITE_OTHER): Payer: Medicare HMO | Admitting: Internal Medicine

## 2019-09-08 ENCOUNTER — Other Ambulatory Visit: Payer: Self-pay

## 2019-09-08 VITALS — BP 118/78 | HR 75 | Ht 61.0 in | Wt 157.0 lb

## 2019-09-08 DIAGNOSIS — N289 Disorder of kidney and ureter, unspecified: Secondary | ICD-10-CM

## 2019-09-08 DIAGNOSIS — R7302 Impaired glucose tolerance (oral): Secondary | ICD-10-CM

## 2019-09-08 DIAGNOSIS — I2510 Atherosclerotic heart disease of native coronary artery without angina pectoris: Secondary | ICD-10-CM

## 2019-09-08 DIAGNOSIS — K219 Gastro-esophageal reflux disease without esophagitis: Secondary | ICD-10-CM | POA: Diagnosis not present

## 2019-09-08 DIAGNOSIS — Z9861 Coronary angioplasty status: Secondary | ICD-10-CM | POA: Diagnosis not present

## 2019-09-08 DIAGNOSIS — F5104 Psychophysiologic insomnia: Secondary | ICD-10-CM | POA: Diagnosis not present

## 2019-09-08 DIAGNOSIS — Z0000 Encounter for general adult medical examination without abnormal findings: Secondary | ICD-10-CM | POA: Diagnosis not present

## 2019-09-08 DIAGNOSIS — I10 Essential (primary) hypertension: Secondary | ICD-10-CM

## 2019-09-08 DIAGNOSIS — M4802 Spinal stenosis, cervical region: Secondary | ICD-10-CM | POA: Diagnosis not present

## 2019-09-08 LAB — POCT URINALYSIS DIPSTICK
Bilirubin, UA: NEGATIVE
Blood, UA: NEGATIVE
Glucose, UA: NEGATIVE
Ketones, UA: NEGATIVE
Leukocytes, UA: NEGATIVE
Nitrite, UA: NEGATIVE
Protein, UA: NEGATIVE
Spec Grav, UA: 1.015 (ref 1.010–1.025)
Urobilinogen, UA: 0.2 E.U./dL
pH, UA: 7 (ref 5.0–8.0)

## 2019-09-08 NOTE — Progress Notes (Signed)
Date:  09/08/2019   Name:  Betty Vaughn   DOB:  23-Feb-1951   MRN:  [id number]   Chief Complaint: Annual Exam (Breast Exam.), Hypertension, Depression, and Insomnia (Trazodone does not help much with sleep) Betty Vaughn is a 68 y.o. female who presents today for her Complete Annual Exam. She feels fairly well. She reports exercising walking some but limited by her neck pain. She reports she is sleeping poorly.   Mammogram - scheduled Colonoscopy  05/2017 DEXA 2019  Hypertension This is a chronic problem. The problem is controlled. Pertinent negatives include no chest pain, headaches, palpitations or shortness of breath. There are no known risk factors for coronary artery disease. Past treatments include calcium channel blockers and diuretics. The current treatment provides significant improvement.  Hyperlipidemia The problem is controlled. Pertinent negatives include no chest pain or shortness of breath. Current antihyperlipidemic treatment includes statins. The current treatment provides significant improvement of lipids. Risk factors: hx of MI and PTCA.  Insomnia Primary symptoms: difficulty falling asleep.  Episode onset: as a teenager. The problem occurs nightly. Treatments tried: Trazodone with no benefit.    Review of Systems  Constitutional: Negative for chills, fatigue and fever.  HENT: Negative for congestion, hearing loss, tinnitus, trouble swallowing and voice change.   Eyes: Negative for visual disturbance.  Respiratory: Negative for cough, chest tightness, shortness of breath and wheezing.   Cardiovascular: Negative for chest pain, palpitations and leg swelling.  Gastrointestinal: Negative for abdominal pain, constipation, diarrhea and vomiting.  Endocrine: Negative for polydipsia and polyuria.  Genitourinary: Negative for dysuria, frequency, genital sores, vaginal bleeding and vaginal discharge.  Musculoskeletal: Negative for arthralgias, gait problem and joint  swelling.  Skin: Negative for color change and rash.  Neurological: Negative for dizziness, tremors, light-headedness and headaches.  Hematological: Negative for adenopathy. Does not bruise/bleed easily.  Psychiatric/Behavioral: Negative for dysphoric mood. The patient has insomnia. The patient is not nervous/anxious.     Patient Active Problem List   Diagnosis Date Noted  . Myofascial muscle pain 01/22/2019  . Weakness of both arms 09/08/2018  . Renal insufficiency 09/08/2018  . 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 Cervical stenosis of spinal canal 01/14/2018  . Personal history of colonic polyps   . Rectal polyp   . Tobacco use disorder 11/20/2016  . Neuropathy 04/10/2016  . Glucose intolerance (impaired glucose tolerance) 05/13/2015  . Benign essential tremor 05/13/2015  . Chronic insomnia 05/13/2015  . 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 Multiple joint complaints 05/13/2015  . Depression, major, recurrent, in partial remission ([place]) 02/23/2014  . Acid reflux 02/23/2014  . Essential hypertension 02/23/2014  . Mixed hyperlipidemia 02/23/2014  . Chronic headache disorder 01/16/2012  . Syrinx of spinal cord ([place]) 01/16/2012  . 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 CAD S/P percutaneous coronary angioplasty 03/16/2010    Allergies  Allergen Reactions  . Azithromycin Swelling  . Propoxyphene Anaphylaxis  . Tramadol Anaphylaxis  . Penicillins Swelling  . Piroxicam Rash  . Sertraline Hcl Rash    Past Surgical History:  Procedure Laterality Date  . ABDOMINAL HYSTERECTOMY     total  . CERVICAL FUSION  2005  . CERVICAL FUSION  01/2018   C2-T2 posterior laminectomy and fusion; removal of previous hardware  . CHOLECYSTECTOMY    . COLONOSCOPY WITH PROPOFOL N/A 06/12/2017   Procedure: COLONOSCOPY WITH PROPOFOL;  Surgeon: Lucilla Lame, MD;  Location: [place];  Service: Gastroenterology;  Laterality: N/A;  . CORONARY ANGIOPLASTY  2011   "  insignifiant  CAD"  . HAND SURGERY Bilateral 2001  . POLYPECTOMY  2000  . POLYPECTOMY N/A 06/12/2017   Procedure: POLYPECTOMY;  Surgeon: Lucilla Lame, MD;  Location: [place];  Service: Gastroenterology;  Laterality: N/A;  . rigger finger      Social History   Tobacco Use  . Smoking status: Former Smoker    Packs/day: 0.25    Years: 30.00    Pack years: 7.50    Types: Cigarettes    Quit date: 07/27/2019    Years since quitting: 0.1  . Smokeless tobacco: Never Used  Substance Use Topics  . Alcohol use: Yes    Comment: occ  . Drug use: No     Medication list has been reviewed and updated.  Current Meds  Medication Sig  . Accu-Chek FastClix Lancets MISC USE TO TEST BLOOD SUGAR TWICE DAILY  . ACCU-CHEK GUIDE test strip USE TO TEST BLOOD SUGAR TWICE DAILY  . amLODipine (NORVASC) 5 MG tablet TAKE 1 TABLET BY MOUTH AT BEDTIME  . ASPIRIN LOW DOSE 81 MG EC tablet TAKE 1 TABLET BY MOUTH EVERY DAY  . atorvastatin (LIPITOR) 40 MG tablet TAKE 1 TABLET BY MOUTH EVERY DAY  . Blood Glucose Monitoring Suppl (ACCU-CHEK GUIDE ME) w/Device KIT TO TEST TWICE DAILY  . buPROPion (WELLBUTRIN XL) 150 MG 24 hr tablet TAKE 2 TABLETS(300 MG) BY MOUTH DAILY  . Calcium Carbonate-Vitamin D 600-400 MG-UNIT tablet TAKE 1 TABLET BY MOUTH EVERY DAY  . chlorthalidone (HYGROTON) 25 MG tablet TAKE 1 TABLET BY MOUTH EVERY DAY  . DULoxetine (CYMBALTA) 60 MG capsule TAKE 1 CAPSULE(60 MG) BY MOUTH DAILY  . gabapentin (NEURONTIN) 300 MG capsule Take 2 capsules (600 mg total) by mouth 2 (two) times daily.  . meloxicam (MOBIC) 15 MG tablet TAKE 1 TABLET(15 MG) BY MOUTH DAILY  . methocarbamol (ROBAXIN) 500 MG tablet TAKE 1 TABLET BY MOUTH FOUR TIMES DAILY  . metoprolol tartrate (LOPRESSOR) 50 MG tablet TAKE 1 TABLET BY MOUTH TWICE A DAY.  . Multiple Minerals-Vitamins (CALCIUM & VIT D3 BONE HEALTH PO) Take by mouth. 600-400 mg  . nicotine (NICODERM CQ - DOSED IN MG/24 HR) 7 mg/24hr patch Place 1 patch (7 mg total) onto the  skin daily.  . nitroGLYCERIN (NITROSTAT) 0.4 MG SL tablet Place under the tongue.  . potassium chloride SA (K-DUR) 20 MEQ tablet TAKE 1 TABLET BY MOUTH EVERY DAY  . tamsulosin (FLOMAX) 0.4 MG CAPS capsule TAKE 1 CAPSULE BY MOUTH EVERY DAY  . traZODone (DESYREL) 50 MG tablet TAKE 1 TABLET BY MOUTH AT BEDTIME  . VENTOLIN HFA 108 (90 Base) MCG/ACT inhaler INHALE 2 PUFFS INTO THE LUNGS EVERY 6 HOURS AS NEEDED FOR WHEEZING OR SHORTNESS OF BREATH  . Zinc 50 MG CAPS Take 220 mg by mouth.     PHQ 2/9 Scores 09/08/2019 09/03/2019 06/21/2019 03/02/2019  PHQ - 2 Score 0 0 2 5  PHQ- 9 Score 6 - 11 16    BP Readings from Last 3 Encounters:  09/08/19 118/78  09/03/19 134/80  06/21/19 118/82    Physical Exam Vitals signs and nursing note reviewed.  Constitutional:      General: She is not in acute distress.    Appearance: She is well-developed.  HENT:     Head: Normocephalic and atraumatic.     Right Ear: Tympanic membrane and ear canal normal.     Left Ear: Tympanic membrane and ear canal normal.     Nose:  Right Sinus: No maxillary sinus tenderness.     Left Sinus: No maxillary sinus tenderness.  Eyes:     General: No scleral icterus.       Right eye: No discharge.        Left eye: No discharge.     Conjunctiva/sclera: Conjunctivae normal.  Neck:     Musculoskeletal: Normal range of motion. No erythema.     Thyroid: No thyromegaly.     Vascular: No carotid bruit.  Cardiovascular:     Rate and Rhythm: Normal rate and regular rhythm.     Pulses: Normal pulses.     Heart sounds: Normal heart sounds.  Pulmonary:     Effort: Pulmonary effort is normal. No respiratory distress.     Breath sounds: No wheezing.  Abdominal:     General: Bowel sounds are normal.     Palpations: Abdomen is soft.     Tenderness: There is no abdominal tenderness.  Musculoskeletal:     Cervical back: She exhibits decreased range of motion, tenderness, bony tenderness and spasm.  Lymphadenopathy:      Cervical: No cervical adenopathy.  Skin:    General: Skin is warm and dry.     Findings: No rash.  Neurological:     Mental Status: She is alert and oriented to person, place, and time.     Cranial Nerves: No cranial nerve deficit.     Sensory: No sensory deficit.     Deep Tendon Reflexes:     Reflex Scores:      Bicep reflexes are 1+ on the right side.      Brachioradialis reflexes are 2+ on the left side.      Patellar reflexes are 2+ on the right side and 2+ on the left side.    Comments: Decreased grip 4/5 on right   Psychiatric:        Attention and Perception: Attention normal.        Mood and Affect: Mood normal.        Speech: Speech normal.        Behavior: Behavior normal.        Thought Content: Thought content normal.        Cognition and Memory: Cognition normal.        Judgment: Judgment normal.     Wt Readings from Last 3 Encounters:  09/08/19 157 lb (71.2 kg)  09/03/19 156 lb (70.8 kg)  06/21/19 154 lb 3.2 oz (69.9 kg)    BP 118/78   Pulse 75   Ht 5' 1"  (1.549 m)   Wt 157 lb (71.2 kg)   SpO2 97%   BMI 29.66 kg/m   Assessment and Plan: 1. Annual physical exam Mammogram scheduled UTD on immunizations and screenings - POCT urinalysis dipstick  2. Essential hypertension Clinically stable exam with well controlled BP.   Tolerating medications, amlodipine 5 mg and metoprolol 50 mg bid, without side effects at this time. Pt to continue current regimen and low sodium diet; benefits of regular exercise as able discussed. - CBC with Differential/Platelet - Comprehensive metabolic panel - TSH + free T4  3. Glucose intolerance (impaired glucose tolerance) Check labs and advise Discussed low carb diet, exercise as able - Hemoglobin A1c  4. Chronic insomnia Recommend stop Trazodone Samples of Belsomra 10 mg given  5. Renal insufficiency Will need to monitor at intervals Avoid NSAIDS - pt taking Mobic as needed for neck pain - Comprehensive metabolic  panel  6. Gastroesophageal reflux disease,  unspecified whether esophagitis present No current sx and not requiring any therapy at this time - CBC with Differential/Platelet  7. CAD S/P percutaneous coronary angioplasty Doing well per recent cardiology evaluation Continue beta blocker, HTN control, high intensity statin therapy with atorvastatin 40 mg - Lipid panel  8. Cervical stenosis of spinal canal Chronic daily pain and weakness Current regimen is providing only moderate benefit   Partially dictated using Editor, commissioning. Any errors are unintentional.  Halina Maidens, MD [facility]  09/08/2019

## 2019-09-08 NOTE — Patient Instructions (Signed)
Take Belsomra just as you are going to bed and turning out the light.  Check with [facility] about your Shingles vaccine - the new one is a 2 dose series.  If you have had them, ask [facility] to send me the information so I can put it in your record.

## 2019-09-09 LAB — TSH+FREE T4
Free T4: 1.1 ng/dL (ref 0.82–1.77)
TSH: 2.37 u[IU]/mL (ref 0.450–4.500)

## 2019-09-09 LAB — CBC WITH DIFFERENTIAL/PLATELET
Basophils Absolute: 0 10*3/uL (ref 0.0–0.2)
Basos: 1 %
EOS (ABSOLUTE): 0.1 10*3/uL (ref 0.0–0.4)
Eos: 2 %
Hematocrit: 40 % (ref 34.0–46.6)
Hemoglobin: 13 g/dL (ref 11.1–15.9)
Immature Grans (Abs): 0 10*3/uL (ref 0.0–0.1)
Immature Granulocytes: 0 %
Lymphocytes Absolute: 2 10*3/uL (ref 0.7–3.1)
Lymphs: 25 %
MCH: 30.8 pg (ref 26.6–33.0)
MCHC: 32.5 g/dL (ref 31.5–35.7)
MCV: 95 fL (ref 79–97)
Monocytes Absolute: 0.5 10*3/uL (ref 0.1–0.9)
Monocytes: 7 %
Neutrophils Absolute: 5.2 10*3/uL (ref 1.4–7.0)
Neutrophils: 65 %
Platelets: 212 10*3/uL (ref 150–450)
RBC: 4.22 x10E6/uL (ref 3.77–5.28)
RDW: 12.3 % (ref 11.7–15.4)
WBC: 7.9 10*3/uL (ref 3.4–10.8)

## 2019-09-09 LAB — COMPREHENSIVE METABOLIC PANEL
ALT: 16 IU/L (ref 0–32)
AST: 21 IU/L (ref 0–40)
Albumin/Globulin Ratio: 1.4 (ref 1.2–2.2)
Albumin: 4.4 g/dL (ref 3.8–4.8)
Alkaline Phosphatase: 94 IU/L (ref 39–117)
BUN/Creatinine Ratio: 11 — ABNORMAL LOW (ref 12–28)
BUN: 12 mg/dL (ref 8–27)
Bilirubin Total: 0.3 mg/dL (ref 0.0–1.2)
CO2: 27 mmol/L (ref 20–29)
Calcium: 9.9 mg/dL (ref 8.7–10.3)
Chloride: 101 mmol/L (ref 96–106)
Creatinine, Ser: 1.11 mg/dL — ABNORMAL HIGH (ref 0.57–1.00)
GFR calc Af Amer: 59 mL/min/{1.73_m2} — ABNORMAL LOW (ref >59)
GFR calc non Af Amer: 52 mL/min/{1.73_m2} — ABNORMAL LOW (ref >59)
Globulin, Total: 3.2 g/dL (ref 1.5–4.5)
Glucose: 114 mg/dL — ABNORMAL HIGH (ref 65–99)
Potassium: 4.4 mmol/L (ref 3.5–5.2)
Sodium: 141 mmol/L (ref 134–144)
Total Protein: 7.6 g/dL (ref 6.0–8.5)

## 2019-09-09 LAB — LIPID PANEL
Chol/HDL Ratio: 3.2 ratio (ref 0.0–4.4)
Cholesterol, Total: 139 mg/dL (ref 100–199)
HDL: 43 mg/dL (ref >39)
LDL Chol Calc (NIH): 77 mg/dL (ref 0–99)
Triglycerides: 101 mg/dL (ref 0–149)
VLDL Cholesterol Cal: 19 mg/dL (ref 5–40)

## 2019-09-09 LAB — HEMOGLOBIN A1C
Est. average glucose Bld gHb Est-mCnc: 126 mg/dL
Hgb A1c MFr Bld: 6 % — ABNORMAL HIGH (ref 4.8–5.6)

## 2019-09-13 ENCOUNTER — Ambulatory Visit
Admission: RE | Admit: 2019-09-13 | Discharge: 2019-09-13 | Disposition: A | Discharge status: Home / Self Care | Payer: Medicare HMO | Source: Ambulatory Visit | Attending: Internal Medicine | Admitting: Internal Medicine

## 2019-09-13 ENCOUNTER — Other Ambulatory Visit: Payer: Self-pay | Admitting: Internal Medicine

## 2019-09-13 DIAGNOSIS — N644 Mastodynia: Secondary | ICD-10-CM

## 2019-09-13 DIAGNOSIS — N6489 Other specified disorders of breast: Secondary | ICD-10-CM | POA: Diagnosis not present

## 2019-09-13 DIAGNOSIS — R922 Inconclusive mammogram: Secondary | ICD-10-CM | POA: Diagnosis not present

## 2019-09-25 ENCOUNTER — Other Ambulatory Visit: Payer: Self-pay | Admitting: Internal Medicine

## 2019-10-25 ENCOUNTER — Other Ambulatory Visit: Payer: Self-pay | Admitting: Internal Medicine

## 2019-11-08 ENCOUNTER — Other Ambulatory Visit: Payer: Self-pay | Admitting: Internal Medicine

## 2019-11-08 ENCOUNTER — Telehealth: Payer: Self-pay

## 2019-11-08 DIAGNOSIS — F5104 Psychophysiologic insomnia: Secondary | ICD-10-CM

## 2019-11-08 MED ORDER — BELSOMRA 10 MG PO TABS: 1.0000 | ORAL_TABLET | Freq: Every evening | ORAL | 5 refills | Status: DC | PRN

## 2019-11-08 NOTE — Telephone Encounter (Signed)
Patient called saying she was given Belsomra 10 mg at her last visit. It is working well and she would like the prescription sent to her Advance, Betty Vaughn,

## 2019-11-10 ENCOUNTER — Other Ambulatory Visit: Payer: Self-pay

## 2019-11-24 ENCOUNTER — Other Ambulatory Visit: Payer: Self-pay | Admitting: Internal Medicine

## 2019-11-24 DIAGNOSIS — F3341 Major depressive disorder, recurrent, in partial remission: Secondary | ICD-10-CM

## 2019-11-24 DIAGNOSIS — M4802 Spinal stenosis, cervical region: Secondary | ICD-10-CM

## 2019-12-21 ENCOUNTER — Other Ambulatory Visit: Payer: Self-pay | Admitting: Internal Medicine

## 2019-12-21 DIAGNOSIS — G629 Polyneuropathy, unspecified: Secondary | ICD-10-CM

## 2020-01-26 ENCOUNTER — Other Ambulatory Visit: Payer: Self-pay | Admitting: Internal Medicine

## 2020-02-26 ENCOUNTER — Other Ambulatory Visit: Payer: Self-pay | Admitting: Internal Medicine

## 2020-03-09 ENCOUNTER — Ambulatory Visit (INDEPENDENT_AMBULATORY_CARE_PROVIDER_SITE_OTHER): Payer: Medicare HMO | Admitting: Internal Medicine

## 2020-03-09 ENCOUNTER — Other Ambulatory Visit: Payer: Self-pay

## 2020-03-09 ENCOUNTER — Encounter: Payer: Self-pay | Admitting: Internal Medicine

## 2020-03-09 VITALS — BP 104/64 | HR 68 | Temp 96.4°F | Ht 61.0 in | Wt 162.0 lb

## 2020-03-09 DIAGNOSIS — R7302 Impaired glucose tolerance (oral): Secondary | ICD-10-CM | POA: Diagnosis not present

## 2020-03-09 DIAGNOSIS — I10 Essential (primary) hypertension: Secondary | ICD-10-CM | POA: Diagnosis not present

## 2020-03-09 DIAGNOSIS — Z86010 Personal history of colonic polyps: Secondary | ICD-10-CM

## 2020-03-09 DIAGNOSIS — F5104 Psychophysiologic insomnia: Secondary | ICD-10-CM | POA: Diagnosis not present

## 2020-03-09 DIAGNOSIS — R1013 Epigastric pain: Secondary | ICD-10-CM

## 2020-03-09 DIAGNOSIS — R635 Abnormal weight gain: Secondary | ICD-10-CM | POA: Diagnosis not present

## 2020-03-09 DIAGNOSIS — F3341 Major depressive disorder, recurrent, in partial remission: Secondary | ICD-10-CM | POA: Diagnosis not present

## 2020-03-09 MED ORDER — OMEPRAZOLE 20 MG PO CPDR: 20.0000 mg | DELAYED_RELEASE_CAPSULE | Freq: Two times a day (BID) | ORAL | 3 refills | Status: DC

## 2020-03-09 MED ORDER — TRAZODONE HCL 50 MG PO TABS: 50.0000 mg | ORAL_TABLET | Freq: Every day | ORAL | 1 refills | Status: DC

## 2020-03-09 NOTE — Progress Notes (Signed)
Date:  03/09/2020   Name:  Betty Vaughn   DOB:  06/11/1951   MRN:  [id number]   Chief Complaint: Hypertension (Follow up.), Diabetes (Pre- DM. Follow up.), Insomnia (Belsomra caused her to sleep too much- even cutting the pill in half causes her to sleep to much. Wants to go back to trazadone. ), and Bloated (Tender and bloated in her stomach for months. Says she feels like she looks "pregnant." Unsure why she is gaining so much weight. Felt bupropion caused weight gain and stopped the medications weeks ago. )  Hypertension This is a chronic problem. The problem is controlled. Associated symptoms include neck pain. Pertinent negatives include no chest pain, headaches, palpitations or shortness of breath. Past treatments include beta blockers, calcium channel blockers and diuretics. The current treatment provides significant improvement. There are no compliance problems.   Diabetes She presents for her follow-up diabetic visit. Diabetes type: pre-diabetes. Her disease course has been stable. Hypoglycemia symptoms include nervousness/anxiousness. Pertinent negatives for hypoglycemia include no dizziness or headaches. Pertinent negatives for diabetes include no chest pain and no fatigue. Current diabetic treatment includes diet. She is compliant with treatment most of the time. An ACE inhibitor/angiotensin II receptor blocker is not being taken.  Depression        This is a chronic problem.The problem is unchanged.  Associated symptoms include insomnia.  Associated symptoms include no fatigue, no headaches and no suicidal ideas.  Past treatments include SNRIs - Serotonin and norepinephrine reuptake inhibitors. Insomnia Primary symptoms: sleep disturbance, difficulty falling asleep, frequent awakening.  The problem occurs nightly. The problem is unchanged. Past treatments include medication. Improvement on treatment: Belsomra was too sedating the next day; wants to go back to trazodone. PMH  includes: depression.  Abdominal Pain This is a new problem. The problem occurs constantly. The problem has been unchanged. The pain is located in the generalized abdominal region. The pain is mild. The quality of the pain is a sensation of fullness. Associated symptoms include belching and constipation. Pertinent negatives include no diarrhea, dysuria, fever, flatus, headaches, hematochezia, melena or vomiting.  Weight gain - She has gained almost 20 lbs in the past year. She thought it was due to bupropion that she began for anxiety last year.  She weaned off of it recently.  Lab Results  Component Value Date   CREATININE 1.11 (H) 09/08/2019   BUN 12 09/08/2019   NA 141 09/08/2019   K 4.4 09/08/2019   CL 101 09/08/2019   CO2 27 09/08/2019   Lab Results  Component Value Date   CHOL 139 09/08/2019   HDL 43 09/08/2019   LDLCALC 77 09/08/2019   TRIG 101 09/08/2019   CHOLHDL 3.2 09/08/2019   Lab Results  Component Value Date   TSH 2.370 09/08/2019   Lab Results  Component Value Date   HGBA1C 6.0 (H) 09/08/2019   Lab Results  Component Value Date   WBC 7.9 09/08/2019   HGB 13.0 09/08/2019   HCT 40.0 09/08/2019   MCV 95 09/08/2019   PLT 212 09/08/2019   Lab Results  Component Value Date   ALT 16 09/08/2019   AST 21 09/08/2019   ALKPHOS 94 09/08/2019   BILITOT 0.3 09/08/2019     Review of Systems  Constitutional: Positive for unexpected weight change. Negative for chills, fatigue and fever.  Respiratory: Negative for chest tightness and shortness of breath.   Cardiovascular: Negative for chest pain, palpitations and leg swelling.  Gastrointestinal: Positive for abdominal  pain and constipation. Negative for blood in stool, diarrhea, flatus, hematochezia, melena and vomiting.  Genitourinary: Negative for dysuria.  Musculoskeletal: Positive for neck pain.  Neurological: Negative for dizziness and headaches.  Psychiatric/Behavioral: Positive for depression, dysphoric mood  and sleep disturbance. Negative for suicidal ideas. The patient is nervous/anxious and has insomnia.     Patient Active Problem List   Diagnosis Date Noted  . Myofascial muscle pain 01/22/2019  . Weakness of both arms 09/08/2018  . Renal insufficiency 09/08/2018  . 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 Cervical stenosis of spinal canal 01/14/2018  . Personal history of colonic polyps   . Rectal polyp   . Tobacco use disorder 11/20/2016  . Neuropathy 04/10/2016  . Glucose intolerance (impaired glucose tolerance) 05/13/2015  . Benign essential tremor 05/13/2015  . Chronic insomnia 05/13/2015  . 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 Multiple joint complaints 05/13/2015  . Depression, major, recurrent, in partial remission ([place]) 02/23/2014  . Acid reflux 02/23/2014  . Essential hypertension 02/23/2014  . Mixed hyperlipidemia 02/23/2014  . Chronic headache disorder 01/16/2012  . 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 CAD S/P percutaneous coronary angioplasty 03/16/2010    Allergies  Allergen Reactions  . Azithromycin Swelling  . Propoxyphene Anaphylaxis  . Tramadol Anaphylaxis  . Penicillins Swelling  . Piroxicam Rash  . Sertraline Hcl Rash    Past Surgical History:  Procedure Laterality Date  . ABDOMINAL HYSTERECTOMY     total  . CERVICAL FUSION  2005  . CERVICAL FUSION  01/2018   C2-T2 posterior laminectomy and fusion; removal of previous hardware  . CHOLECYSTECTOMY    . COLONOSCOPY WITH PROPOFOL N/A 06/12/2017   Procedure: COLONOSCOPY WITH PROPOFOL;  Surgeon: Lucilla Lame, MD;  Location: [facility];  Service: Gastroenterology;  Laterality: N/A;  . 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"  . HAND SURGERY Bilateral 2001  . POLYPECTOMY  2000  . POLYPECTOMY N/A 06/12/2017   Procedure: POLYPECTOMY;  Surgeon: Lucilla Lame, MD;  Location: [facility];  Service: Gastroenterology;  Laterality: N/A;  . rigger finger      Social History    Tobacco Use  . Smoking status: Former Smoker    Packs/day: 0.25    Years: 30.00    Pack years: 7.50    Types: Cigarettes    Quit date: 07/27/2019    Years since quitting: 0.6  . Smokeless tobacco: Never Used  Substance Use Topics  . Alcohol use: Yes    Comment: occ  . Drug use: No     Medication list has been reviewed and updated.  Current Meds  Medication Sig  . Accu-Chek FastClix Lancets MISC USE TO TEST BLOOD SUGAR TWICE DAILY  . ACCU-CHEK GUIDE test strip USE TO TEST BLOOD SUGAR TWICE DAILY  . amLODipine (NORVASC) 5 MG tablet TAKE 1 TABLET BY MOUTH AT BEDTIME  . ASPIRIN LOW DOSE 81 MG EC tablet TAKE 1 TABLET BY MOUTH EVERY DAY  . atorvastatin (LIPITOR) 40 MG tablet TAKE 1 TABLET BY MOUTH EVERY DAY  . Blood Glucose Monitoring Suppl (ACCU-CHEK GUIDE ME) w/Device KIT TO TEST TWICE DAILY  . Calcium Carbonate-Vitamin D 600-400 MG-UNIT tablet TAKE 1 TABLET BY MOUTH EVERY DAY  . chlorthalidone (HYGROTON) 25 MG tablet TAKE 1 TABLET BY MOUTH EVERY DAY  . DULoxetine (CYMBALTA) 60 MG capsule TAKE 1 CAPSULE(60 MG) BY MOUTH DAILY  . gabapentin (NEURONTIN) 300 MG capsule TAKE 2 CAPSULES(600 MG) BY MOUTH TWICE DAILY  . lactobacillus acidophilus (BACID) TABS  tablet Take 2 tablets by mouth 3 (three) times daily.  . meloxicam (MOBIC) 15 MG tablet TAKE 1 TABLET(15 MG) BY MOUTH DAILY  . methocarbamol (ROBAXIN) 500 MG tablet TAKE 1 TABLET BY MOUTH FOUR TIMES DAILY  . metoprolol tartrate (LOPRESSOR) 50 MG tablet TAKE 1 TABLET BY MOUTH TWICE DAILY  . Multiple Minerals-Vitamins (CALCIUM & VIT D3 BONE HEALTH PO) Take by mouth. 600-400 mg  . nitroGLYCERIN (NITROSTAT) 0.4 MG SL tablet Place under the tongue.  . potassium chloride SA (KLOR-CON) 20 MEQ tablet TAKE 1 TABLET BY MOUTH EVERY DAY  . tamsulosin (FLOMAX) 0.4 MG CAPS capsule TAKE 1 CAPSULE BY MOUTH EVERY DAY  . VENTOLIN HFA 108 (90 Base) MCG/ACT inhaler INHALE 2 PUFFS INTO THE LUNGS EVERY 6 HOURS AS NEEDED FOR WHEEZING OR SHORTNESS OF BREATH   . Zinc 50 MG CAPS Take 220 mg by mouth.     PHQ 2/9 Scores 03/09/2020 09/08/2019 09/03/2019 06/21/2019  PHQ - 2 Score 4 0 0 2  PHQ- 9 Score 15 6 - 11   GAD 7 : Generalized Anxiety Score 03/09/2020  Nervous, Anxious, on Edge 3  Control/stop worrying 0  Worry too much - different things 0  Trouble relaxing 1  Restless 3  Easily annoyed or irritable 2  Afraid - awful might happen 2  Total GAD 7 Score 11  Anxiety Difficulty Somewhat difficult    BP Readings from Last 3 Encounters:  03/09/20 104/64  09/08/19 118/78  09/03/19 134/80    Physical Exam Vitals and nursing note reviewed.  Constitutional:      General: She is not in acute distress.    Appearance: Normal appearance. She is well-developed.  HENT:     Head: Normocephalic and atraumatic.  Cardiovascular:     Rate and Rhythm: Normal rate and regular rhythm.     Pulses: Normal pulses.     Heart sounds: No murmur.  Pulmonary:     Effort: Pulmonary effort is normal. No respiratory distress.     Breath sounds: No wheezing or rhonchi.  Abdominal:     General: Bowel sounds are decreased. There is distension.     Palpations: Abdomen is soft.     Tenderness: There is abdominal tenderness in the epigastric area. There is no right CVA tenderness, left CVA tenderness or guarding.  Musculoskeletal:     Cervical back: Normal range of motion.     Right lower leg: No edema.  Lymphadenopathy:     Cervical: No cervical adenopathy.  Skin:    General: Skin is warm and dry.     Capillary Refill: Capillary refill takes less than 2 seconds.     Findings: No rash.  Neurological:     Mental Status: She is alert and oriented to person, place, and time.  Psychiatric:        Behavior: Behavior normal.        Thought Content: Thought content normal.     Wt Readings from Last 3 Encounters:  03/09/20 162 lb (73.5 kg)  09/08/19 157 lb (71.2 kg)  09/03/19 156 lb (70.8 kg)    BP 104/64   Pulse 68   Temp (!) 96.4 F (35.8 C)  (Temporal)   Ht 5' 1"  (1.549 m)   Wt 162 lb (73.5 kg)   SpO2 97%   BMI 30.61 kg/m   Assessment and Plan: 1. Glucose intolerance (impaired glucose tolerance) Will check labs and advise May well be worsening due to weight gain - Hemoglobin A1c  2.  Essential hypertension Clinically stable exam with well controlled BP. Tolerating medications without side effects at this time. Pt to continue current regimen and low sodium diet; benefits of regular exercise as able discussed.  3. Epigastric pain Pt is s/p cholecystectomy, hysterectomy.  Colonoscopy 2018. Will test and treat for H pylori if positive Begin PPI bid If needed, will refer to GI. - H. pylori breath test - Comprehensive metabolic panel - CBC with Differential/Platelet - omeprazole (PRILOSEC) 20 MG capsule; Take 1 capsule (20 mg total) by mouth 2 (two) times daily before a meal.  Dispense: 60 capsule; Refill: 3  4. Depression, major, recurrent, in partial remission ([place]) Fair control on Cymbalta Consider increasing the dose to 90 mg Remain off of Bupropion  5. Chronic insomnia Resume trazodone - traZODone (DESYREL) 50 MG tablet; Take 1 tablet (50 mg total) by mouth at bedtime.  Dispense: 90 tablet; Refill: 1  6. Weight gain, abnormal Likely a combination of decreased activity with unemployment plus medication effect Continue to work on diet, exercise as able Remain off of bupropion   Partially dictated using Editor, commissioning. Any errors are unintentional.  Halina Maidens, MD [facility]  03/09/2020

## 2020-03-11 LAB — CBC WITH DIFFERENTIAL/PLATELET
Basophils Absolute: 0 10*3/uL (ref 0.0–0.2)
Basos: 1 %
EOS (ABSOLUTE): 0.2 10*3/uL (ref 0.0–0.4)
Eos: 3 %
Hematocrit: 37.3 % (ref 34.0–46.6)
Hemoglobin: 12.7 g/dL (ref 11.1–15.9)
Immature Grans (Abs): 0 10*3/uL (ref 0.0–0.1)
Immature Granulocytes: 0 %
Lymphocytes Absolute: 2.1 10*3/uL (ref 0.7–3.1)
Lymphs: 31 %
MCH: 31.2 pg (ref 26.6–33.0)
MCHC: 34 g/dL (ref 31.5–35.7)
MCV: 92 fL (ref 79–97)
Monocytes Absolute: 0.5 10*3/uL (ref 0.1–0.9)
Monocytes: 8 %
Neutrophils Absolute: 3.8 10*3/uL (ref 1.4–7.0)
Neutrophils: 57 %
Platelets: 233 10*3/uL (ref 150–450)
RBC: 4.07 x10E6/uL (ref 3.77–5.28)
RDW: 12.4 % (ref 11.7–15.4)
WBC: 6.6 10*3/uL (ref 3.4–10.8)

## 2020-03-11 LAB — COMPREHENSIVE METABOLIC PANEL
ALT: 18 IU/L (ref 0–32)
AST: 24 IU/L (ref 0–40)
Albumin/Globulin Ratio: 1.7 (ref 1.2–2.2)
Albumin: 4.5 g/dL (ref 3.8–4.8)
Alkaline Phosphatase: 104 IU/L (ref 39–117)
BUN/Creatinine Ratio: 9 — ABNORMAL LOW (ref 12–28)
BUN: 12 mg/dL (ref 8–27)
Bilirubin Total: 0.3 mg/dL (ref 0.0–1.2)
CO2: 24 mmol/L (ref 20–29)
Calcium: 10 mg/dL (ref 8.7–10.3)
Chloride: 103 mmol/L (ref 96–106)
Creatinine, Ser: 1.27 mg/dL — ABNORMAL HIGH (ref 0.57–1.00)
GFR calc Af Amer: 50 mL/min/{1.73_m2} — ABNORMAL LOW (ref >59)
GFR calc non Af Amer: 43 mL/min/{1.73_m2} — ABNORMAL LOW (ref >59)
Globulin, Total: 2.7 g/dL (ref 1.5–4.5)
Glucose: 113 mg/dL — ABNORMAL HIGH (ref 65–99)
Potassium: 4.5 mmol/L (ref 3.5–5.2)
Sodium: 141 mmol/L (ref 134–144)
Total Protein: 7.2 g/dL (ref 6.0–8.5)

## 2020-03-11 LAB — HEMOGLOBIN A1C
Est. average glucose Bld gHb Est-mCnc: 128 mg/dL
Hgb A1c MFr Bld: 6.1 % — ABNORMAL HIGH (ref 4.8–5.6)

## 2020-03-11 LAB — H. PYLORI BREATH TEST: H pylori Breath Test: NEGATIVE

## 2020-03-27 ENCOUNTER — Other Ambulatory Visit: Payer: Self-pay | Admitting: Internal Medicine

## 2020-04-26 ENCOUNTER — Other Ambulatory Visit: Payer: Self-pay | Admitting: Internal Medicine

## 2020-04-26 NOTE — Telephone Encounter (Signed)
Requested Prescriptions  Pending Prescriptions Disp Refills  . atorvastatin (LIPITOR) 40 MG tablet [Pharmacy Med Name: ATORVASTATIN 40MG  TABLETS] 90 tablet 1    Sig: TAKE 1 TABLET BY MOUTH EVERY DAY     Cardiovascular:  Antilipid - Statins Failed - 04/26/2020  6:27 AM      Failed - LDL in normal range and within 360 days    LDL Chol Calc (NIH)  Date Value Ref Range Status  09/08/2019 77 0 - 99 mg/dL Final         Passed - Total Cholesterol in normal range and within 360 days    Cholesterol, Total  Date Value Ref Range Status  09/08/2019 139 100 - 199 mg/dL Final         Passed - HDL in normal range and within 360 days    HDL  Date Value Ref Range Status  09/08/2019 43 >39 mg/dL Final         Passed - Triglycerides in normal range and within 360 days    Triglycerides  Date Value Ref Range Status  09/08/2019 101 0 - 149 mg/dL Final         Passed - Patient is not pregnant      Passed - Valid encounter within last 12 months    Recent Outpatient Visits          1 month ago Glucose intolerance (impaired glucose tolerance)   [facility] Glean Hess, MD   7 months ago Annual physical exam   [facility] Glean Hess, MD   7 months ago Myofascial muscle pain   [facility] Glean Hess, MD   1 year ago Depression, major, recurrent, in partial remission [facility])   [facility] Glean Hess, MD   1 year ago Essential hypertension   [place], Laura H, MD      Future Appointments            In 5 months Glean Hess, MD [facility]           . amLODipine ([place]) 5 MG tablet [Pharmacy Med Name: AMLODIPINE BESYLATE 5MG  TABLETS] 90 tablet 1    Sig: TAKE 1 TABLET BY MOUTH AT BEDTIME     Cardiovascular:  Calcium Channel Blockers Passed - 04/26/2020  6:27 AM      Passed - Last BP in normal range    BP Readings from Last 1 Encounters:  03/09/20 104/64         Passed - Valid  encounter within last 6 months    Recent Outpatient Visits          1 month ago Glucose intolerance (impaired glucose tolerance)   [facility] Glean Hess, MD   7 months ago Annual physical exam   [facility] Glean Hess, MD   7 months ago Myofascial muscle pain   [facility] Glean Hess, MD   1 year ago Depression, major, recurrent, in partial remission [facility])   [facility] Glean Hess, MD   1 year ago Essential hypertension   [place], Laura H, MD      Future Appointments            In 5 months [name] [name], MD [facility], [place]           . chlorthalidone (HYGROTON) 25 MG tablet [Pharmacy Med Name: CHLORTHALIDONE 25MG  TABLETS] 90  tablet 1    Sig: TAKE 1 TABLET BY MOUTH EVERY DAY     Cardiovascular: Diuretics - Thiazide Failed - 04/26/2020  6:27 AM      Failed - Cr in normal range and within 360 days    Creatinine  Date Value Ref Range Status  07/06/2013 0.91 0.60 - 1.30 mg/dL Final   Creatinine, Ser  Date Value Ref Range Status  03/09/2020 1.27 (H) 0.57 - 1.00 mg/dL Final         Passed - Ca in normal range and within 360 days    Calcium  Date Value Ref Range Status  03/09/2020 10.0 8.7 - 10.3 mg/dL Final   Calcium, Total  Date Value Ref Range Status  07/06/2013 9.1 8.5 - 10.1 mg/dL Final         Passed - K in normal range and within 360 days    Potassium  Date Value Ref Range Status  03/09/2020 4.5 3.5 - 5.2 mmol/L Final  07/06/2013 3.6 3.5 - 5.1 mmol/L Final         Passed - Na in normal range and within 360 days    Sodium  Date Value Ref Range Status  03/09/2020 141 134 - 144 mmol/L Final  07/06/2013 137 136 - 145 mmol/L Final         Passed - Last BP in normal range    BP Readings from Last 1 Encounters:  03/09/20 104/64         Passed - Valid encounter within last 6 months    Recent Outpatient Visits          1 month ago Glucose intolerance  (impaired glucose tolerance)   [facility] Glean Hess, MD   7 months ago Annual physical exam   [facility] Glean Hess, MD   7 months ago Myofascial muscle pain   [facility] Glean Hess, MD   1 year ago Depression, major, recurrent, in partial remission [facility])   [facility] Glean Hess, MD   1 year ago Essential hypertension   [facility] Glean Hess, MD      Future Appointments            In 5 months Glean Hess, MD [facility], [place]           . potassium chloride SA (KLOR-CON) 20 MEQ tablet [Pharmacy Med Name: POTASSIUM CL 20MEQ ER TABLETS] 90 tablet 1    Sig: TAKE 1 TABLET BY MOUTH EVERY DAY     Endocrinology:  Minerals - Potassium Supplementation Failed - 04/26/2020  6:27 AM      Failed - Cr in normal range and within 360 days    Creatinine  Date Value Ref Range Status  07/06/2013 0.91 0.60 - 1.30 mg/dL Final   Creatinine, Ser  Date Value Ref Range Status  03/09/2020 1.27 (H) 0.57 - 1.00 mg/dL Final         Passed - K in normal range and within 360 days    Potassium  Date Value Ref Range Status  03/09/2020 4.5 3.5 - 5.2 mmol/L Final  07/06/2013 3.6 3.5 - 5.1 mmol/L Final         Passed - Valid encounter within last 12 months    Recent Outpatient Visits          1 month ago Glucose intolerance (impaired glucose tolerance)   [facility] Glean Hess, MD   7  months ago Annual physical exam   [facility] Glean Hess, MD   7 months ago Myofascial muscle pain   [facility] Glean Hess, MD   1 year ago Depression, major, recurrent, in partial remission [facility])   [facility] Glean Hess, MD   1 year ago Essential hypertension   [facility] Glean Hess, MD      Future Appointments            In 5 months [name] [name], MD [facility], [facility]

## 2020-05-15 ENCOUNTER — Ambulatory Visit
Admission: EM | Admit: 2020-05-15 | Discharge: 2020-05-15 | Disposition: A | Discharge status: Home / Self Care | Payer: Medicare Other | Attending: Emergency Medicine | Admitting: Emergency Medicine

## 2020-05-15 ENCOUNTER — Ambulatory Visit
Admission: EM | Admit: 2020-05-15 | Discharge: 2020-05-15 | Disposition: A | Discharge status: Home / Self Care | Payer: Medicare HMO

## 2020-05-15 ENCOUNTER — Telehealth: Payer: Self-pay | Admitting: Internal Medicine

## 2020-05-15 ENCOUNTER — Ambulatory Visit (INDEPENDENT_AMBULATORY_CARE_PROVIDER_SITE_OTHER): Payer: Medicare Other

## 2020-05-15 ENCOUNTER — Other Ambulatory Visit: Payer: Self-pay

## 2020-05-15 DIAGNOSIS — M2391 Unspecified internal derangement of right knee: Secondary | ICD-10-CM

## 2020-05-15 NOTE — Discharge Instructions (Addendum)
Continue the meloxicam, Neurontin, knee brace, cane.  I would add 1000 mg of Tylenol 3-4 times a day as needed for pain.  Follow-up with [facility] as soon as you can for advanced imaging and further management.

## 2020-05-15 NOTE — Telephone Encounter (Signed)
Called pt told her to go to [facility] to be seen and get xrays done if needed. Pt verbalized understanding.  KP

## 2020-05-15 NOTE — ED Triage Notes (Signed)
Pt reports her right knee locked up last night and it "went out" and she was on the floor for a few hours. States she can't get it unlocked.

## 2020-05-15 NOTE — ED Provider Notes (Signed)
HPI  SUBJECTIVE:  Betty Vaughn is a 69 y.o. female who presents with 2 episodes of right knee "locking" yesterday.  She states that she was standing up from the couch to go get her phone and her knee "locked up" where she was unable to flex it.  This lasted for about 45 minutes and eventually released on its own.  She had a second episode while walking to the bed.  She denies deformity, muscle spasm, trauma to the right knee.  No change in her baseline distal numbness or tingling, no joint swelling, erythema, distal color or temperature changes, change in physical activity.  She states that her knee has been giving way for the past 2 months.  She has applied a knee compression/brace device, ice, heat, and is using a cane.  The knee brace helps.  She is currently taking Neurontin and meloxicam for her left knee.  Symptoms are worse with weightbearing.  She has never had symptoms like this before.  She has a past medical history of osteoporosis, borderline diabetes, peripheral neuropathy, hypertension, status post C-spine fusion.  No history of right knee injury, dislocation.  BOF:BPZWCHEN, [name], MD     Past Medical History:  Diagnosis Date  . Allergy   . Arthritis   . Chronic insomnia   . Colon cancer ([place])    polyp was cancerous  . Depression   . Diabetes mellitus without complication ([place])   . GERD (gastroesophageal reflux disease)   . Hx of colonic polyps   . Hyperlipidemia   . Hypertension   . Neuromuscular disorder ([place])   . Psoriasis    scalp    Past Surgical History:  Procedure Laterality Date  . ABDOMINAL HYSTERECTOMY     total  . CERVICAL FUSION  2005  . CERVICAL FUSION  01/2018   C2-T2 posterior laminectomy and fusion; removal of previous hardware  . CHOLECYSTECTOMY    . COLONOSCOPY WITH PROPOFOL N/A 06/12/2017   Procedure: COLONOSCOPY WITH PROPOFOL;  Surgeon: Lucilla Lame, MD;  Location: [facility];  Service: Gastroenterology;  Laterality: N/A;  .  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"  . HAND SURGERY Bilateral 2001  . POLYPECTOMY  2000  . POLYPECTOMY N/A 06/12/2017   Procedure: POLYPECTOMY;  Surgeon: Lucilla Lame, MD;  Location: [facility];  Service: Gastroenterology;  Laterality: N/A;  . rigger finger      Family History  Problem Relation Age of Onset  . Breast cancer Mother 44  . Diabetes Mother   . CAD Father   . Heart attack Father   . Diabetes Daughter   . Kidney failure Daughter   . Breast cancer Sister 31  . Sickle cell anemia Other   . Cancer Brother        lung  . Heart attack Sister   . Cancer Sister        ovarian  . Kidney cancer Neg Hx   . Bladder Cancer Neg Hx     Social History   Tobacco Use  . Smoking status: Current Every Day Smoker    Packs/day: 0.25    Years: 30.00    Pack years: 7.50    Types: Cigarettes  . Smokeless tobacco: Never Used  Vaping Use  . Vaping Use: Former  Substance Use Topics  . Alcohol use: Yes    Comment: occ  . Drug use: No    No current facility-administered medications for this encounter.  Current Outpatient Medications:  .  Accu-Chek  FastClix Lancets MISC, USE TO TEST BLOOD SUGAR TWICE DAILY, Disp: 102 each, Rfl: 5 .  ACCU-CHEK GUIDE test strip, USE TO TEST BLOOD SUGAR TWICE DAILY, Disp: 100 strip, Rfl: 5 .  amLODipine (NORVASC) 5 MG tablet, TAKE 1 TABLET BY MOUTH AT BEDTIME, Disp: 90 tablet, Rfl: 1 .  ASPIRIN LOW DOSE 81 MG EC tablet, TAKE 1 TABLET BY MOUTH EVERY DAY, Disp: 30 tablet, Rfl: 5 .  atorvastatin (LIPITOR) 40 MG tablet, TAKE 1 TABLET BY MOUTH EVERY DAY, Disp: 90 tablet, Rfl: 1 .  Blood Glucose Monitoring Suppl (ACCU-CHEK GUIDE ME) w/Device KIT, TO TEST TWICE DAILY, Disp: 1 kit, Rfl: 0 .  Calcium Carbonate-Vitamin D 600-400 MG-UNIT tablet, TAKE 1 TABLET BY MOUTH EVERY DAY, Disp: 30 tablet, Rfl: 5 .  chlorthalidone (HYGROTON) 25 MG tablet, TAKE 1 TABLET BY MOUTH EVERY DAY, Disp: 90 tablet, Rfl: 1 .  DULoxetine (CYMBALTA) 60 MG capsule,  TAKE 1 CAPSULE(60 MG) BY MOUTH DAILY, Disp: 90 capsule, Rfl: 1 .  gabapentin (NEURONTIN) 300 MG capsule, TAKE 2 CAPSULES(600 MG) BY MOUTH TWICE DAILY, Disp: 360 capsule, Rfl: 1 .  lactobacillus acidophilus (BACID) TABS tablet, Take 2 tablets by mouth 3 (three) times daily., Disp: , Rfl:  .  meloxicam (MOBIC) 15 MG tablet, TAKE 1 TABLET(15 MG) BY MOUTH DAILY, Disp: 90 tablet, Rfl: 1 .  methocarbamol (ROBAXIN) 500 MG tablet, TAKE 1 TABLET BY MOUTH FOUR TIMES DAILY, Disp: 120 tablet, Rfl: 5 .  metoprolol tartrate (LOPRESSOR) 50 MG tablet, TAKE 1 TABLET BY MOUTH TWICE DAILY, Disp: 180 tablet, Rfl: 3 .  Multiple Minerals-Vitamins (CALCIUM & VIT D3 BONE HEALTH PO), Take by mouth. 600-400 mg, Disp: , Rfl:  .  nitroGLYCERIN (NITROSTAT) 0.4 MG SL tablet, Place under the tongue., Disp: , Rfl:  .  omeprazole (PRILOSEC) 20 MG capsule, Take 1 capsule (20 mg total) by mouth 2 (two) times daily before a meal., Disp: 60 capsule, Rfl: 3 .  potassium chloride SA (KLOR-CON) 20 MEQ tablet, TAKE 1 TABLET BY MOUTH EVERY DAY, Disp: 90 tablet, Rfl: 3 .  tamsulosin (FLOMAX) 0.4 MG CAPS capsule, TAKE 1 CAPSULE BY MOUTH EVERY DAY, Disp: 90 capsule, Rfl: 1 .  traZODone (DESYREL) 50 MG tablet, Take 1 tablet (50 mg total) by mouth at bedtime., Disp: 90 tablet, Rfl: 1 .  VENTOLIN HFA 108 (90 Base) MCG/ACT inhaler, INHALE 2 PUFFS INTO THE LUNGS EVERY 6 HOURS AS NEEDED FOR WHEEZING OR SHORTNESS OF BREATH, Disp: 54 g, Rfl: 1 .  Zinc 50 MG CAPS, Take 220 mg by mouth. , Disp: , Rfl:   Allergies  Allergen Reactions  . Azithromycin Swelling  . Propoxyphene Anaphylaxis  . Tramadol Anaphylaxis  . Penicillins Swelling  . Piroxicam Rash  . Sertraline Hcl Rash     ROS  As noted in HPI.   Physical Exam  BP 121/76 (BP Location: Left Arm)   Pulse 69   Temp 98.4 F (36.9 C) (Oral)   Resp 18   Ht _0  (1.549 m)   Wt 72.1 kg   SpO2 97%   BMI 30.04 kg/m   Constitutional: Well developed, well nourished, no acute  distress Eyes:  EOMI, conjunctiva normal bilaterally HENT: Normocephalic, atraumatic,mucus membranes moist Respiratory: Normal inspiratory effort Cardiovascular: Normal rate GI: nondistended skin: No rash, skin intact Musculoskeletal: R Knee ROM baseline for Pt, Flexion  intact , Patella NT,  Patellar tendon NT, Medial joint  tender, Lateral joint NT , posterior medial popliteal region tender, rest of posterior  knee NT, Varus MCL stress testing stable, Valgus LCL stress testing stable, McMurray's testing abnormal, Lachman's negative. Distal NVI with intact baseline sensation / motor / pulse distal to knee.  No joint effusion. No erythema. No increased temperature. No crepitus. Able to bear some weight, but has antalgic gait. Neurologic: Alert & oriented x 3, no focal neuro deficits Psychiatric: Speech and behavior appropriate   ED Course   Medications - No data to display  Orders Placed This Encounter  Procedures  . DG Knee Complete 4 Views Right    Standing Status:   Standing    Number of Occurrences:   1    Order Specific Question:   Reason for Exam (SYMPTOM  OR DIAGNOSIS REQUIRED)    Answer:   pain    No results found for this or any previous visit (from the past 24 hour(s)). DG Knee Complete 4 Views Right  Result Date: 05/15/2020 CLINICAL DATA:  Acute right knee pain. EXAM: RIGHT KNEE - COMPLETE 4+ VIEW COMPARISON:  None. FINDINGS: No evidence of fracture, dislocation, or joint effusion. Mild narrowing of medial joint space is noted. Soft tissues are unremarkable. IMPRESSION: Mild degenerative joint disease. No acute abnormality seen in the right knee. Electronically Signed   By: Marijo Conception M.D.   On: 05/15/2020 18:49    ED Clinical Impression  1. Locking of right knee      ED Assessment/Plan  Reviewed imaging independently.  Mild degenerative joint disease.  No fracture or dislocation, joint effusion.  See radiology report for full details.  Patient already is on  appropriate pain medication, has a cane and a sturdy flexible knee brace/compression sleeve.  Suspect mechanical derangement of the knee such as loose piece of bone not visualized on x-ray or torn meniscus.  The knee is otherwise stable.  There is no evidence of septic joint, Gout, patellar or knee dislocation.  will have her follow-up with [facility] ASAP for advanced imaging and further management.  Adding Tylenol 1000 mg 3-4 times a day to her meloxicam, Neurontin and topical ointment.    Discussed imaging, MDM, treatment plan, and plan for follow-up with patient. patient agrees with plan.   No orders of the defined types were placed in this encounter.   *This clinic note was created using Dragon dictation software. Therefore, there may be occasional mistakes despite careful proofreading.   ?    Melynda Ripple, MD 05/16/20 1401

## 2020-05-15 NOTE — Telephone Encounter (Unsigned)
Copied from [place] [id number]. Topic: Appointment Scheduling - Scheduling Inquiry for Clinic >> May 15, 2020 11:18 AM Sheran Luz wrote: Reason for CRM: Patient would like to know if she can be seen by Dr. Ronnald Ramp while Dr. [name] is out. Patient states she is having knee pain.

## 2020-05-26 ENCOUNTER — Other Ambulatory Visit: Payer: Self-pay | Admitting: Internal Medicine

## 2020-05-26 DIAGNOSIS — M4802 Spinal stenosis, cervical region: Secondary | ICD-10-CM

## 2020-05-26 DIAGNOSIS — F3341 Major depressive disorder, recurrent, in partial remission: Secondary | ICD-10-CM

## 2020-05-26 DIAGNOSIS — R1013 Epigastric pain: Secondary | ICD-10-CM

## 2020-06-02 ENCOUNTER — Ambulatory Visit: Payer: Medicare Other | Admitting: Internal Medicine

## 2020-06-07 ENCOUNTER — Ambulatory Visit (INDEPENDENT_AMBULATORY_CARE_PROVIDER_SITE_OTHER): Payer: Medicare Other | Admitting: Internal Medicine

## 2020-06-07 ENCOUNTER — Other Ambulatory Visit: Payer: Self-pay

## 2020-06-07 ENCOUNTER — Encounter: Payer: Self-pay | Admitting: Internal Medicine

## 2020-06-07 VITALS — BP 150/90 | HR 71 | Temp 98.6°F | Ht 61.0 in | Wt 157.0 lb

## 2020-06-07 DIAGNOSIS — M1711 Unilateral primary osteoarthritis, right knee: Secondary | ICD-10-CM

## 2020-06-07 DIAGNOSIS — R2689 Other abnormalities of gait and mobility: Secondary | ICD-10-CM | POA: Diagnosis not present

## 2020-06-07 DIAGNOSIS — I10 Essential (primary) hypertension: Secondary | ICD-10-CM | POA: Diagnosis not present

## 2020-06-07 DIAGNOSIS — F3341 Major depressive disorder, recurrent, in partial remission: Secondary | ICD-10-CM | POA: Diagnosis not present

## 2020-06-07 NOTE — Progress Notes (Signed)
Date:  06/07/2020   Name:  Betty Vaughn   DOB:  03-23-1951   MRN:  [id number]   Chief Complaint: Leg Pain (X1.5 months, both legs, right leg locked up went to [facility])  Knee Pain  There was no injury mechanism. The pain is present in the right knee. The quality of the pain is described as cramping (and locking). The pain is mild. The pain has been fluctuating since onset.  Dizziness This is a new problem. The current episode started more than 1 month ago. The problem occurs daily. The problem has been unchanged. Associated symptoms include vertigo. Pertinent negatives include no abdominal pain, chest pain, fatigue, fever, headaches, visual change or weakness. Associated symptoms comments: Gait imbalance.  Depression        This is a chronic problem.  Progression since onset: worse today due to sudden death of her 69 y/o neice.  Associated symptoms include no fatigue and no headaches.   Lab Results  Component Value Date   CREATININE 1.27 (H) 03/09/2020   BUN 12 03/09/2020   NA 141 03/09/2020   K 4.5 03/09/2020   CL 103 03/09/2020   CO2 24 03/09/2020   Lab Results  Component Value Date   CHOL 139 09/08/2019   HDL 43 09/08/2019   LDLCALC 77 09/08/2019   TRIG 101 09/08/2019   CHOLHDL 3.2 09/08/2019   Lab Results  Component Value Date   TSH 2.370 09/08/2019   Lab Results  Component Value Date   HGBA1C 6.1 (H) 03/09/2020   Lab Results  Component Value Date   WBC 6.6 03/09/2020   HGB 12.7 03/09/2020   HCT 37.3 03/09/2020   MCV 92 03/09/2020   PLT 233 03/09/2020   Lab Results  Component Value Date   ALT 18 03/09/2020   AST 24 03/09/2020   ALKPHOS 104 03/09/2020   BILITOT 0.3 03/09/2020     Review of Systems  Constitutional: Negative for fatigue and fever.  HENT: Negative for trouble swallowing.   Respiratory: Negative for chest tightness and shortness of breath.   Cardiovascular: Negative for chest pain.  Gastrointestinal: Negative for abdominal pain,  blood in stool, constipation and diarrhea.  Neurological: Positive for dizziness, vertigo and light-headedness. Negative for weakness and headaches.  Psychiatric/Behavioral: Positive for depression and dysphoric mood. Negative for sleep disturbance. The patient is nervous/anxious.     Patient Active Problem List   Diagnosis Date Noted  . Myofascial muscle pain 01/22/2019  . Weakness of both arms 09/08/2018  . Renal insufficiency 09/08/2018  . 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 Cervical stenosis of spinal canal 01/14/2018  . Personal history of colonic polyps   . Rectal polyp   . Tobacco use disorder 11/20/2016  . Neuropathy 04/10/2016  . Glucose intolerance (impaired glucose tolerance) 05/13/2015  . Benign essential tremor 05/13/2015  . Chronic insomnia 05/13/2015  . 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 Multiple joint complaints 05/13/2015  . Depression, major, recurrent, in partial remission ([place]) 02/23/2014  . Acid reflux 02/23/2014  . Essential hypertension 02/23/2014  . Mixed hyperlipidemia 02/23/2014  . Chronic headache disorder 01/16/2012  . 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 CAD S/P percutaneous coronary angioplasty 03/16/2010    Allergies  Allergen Reactions  . Azithromycin Swelling  . Propoxyphene Anaphylaxis  . Tramadol Anaphylaxis  . Penicillins Swelling  . Piroxicam Rash  . Sertraline Hcl Rash    Past Surgical History:  Procedure Laterality Date  . ABDOMINAL HYSTERECTOMY     total  .  CERVICAL FUSION  2005  . CERVICAL FUSION  01/2018   C2-T2 posterior laminectomy and fusion; removal of previous hardware  . CHOLECYSTECTOMY    . COLONOSCOPY WITH PROPOFOL N/A 06/12/2017   Procedure: COLONOSCOPY WITH PROPOFOL;  Surgeon: Lucilla Lame, MD;  Location: [facility];  Service: Gastroenterology;  Laterality: N/A;  . 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"  . HAND SURGERY Bilateral 2001  . POLYPECTOMY  2000  . POLYPECTOMY N/A  06/12/2017   Procedure: POLYPECTOMY;  Surgeon: Lucilla Lame, MD;  Location: [facility];  Service: Gastroenterology;  Laterality: N/A;  . rigger finger      Social History   Tobacco Use  . Smoking status: Current Every Day Smoker    Packs/day: 0.25    Years: 30.00    Pack years: 7.50    Types: Cigarettes  . Smokeless tobacco: Never Used  Vaping Use  . Vaping Use: Former  Substance Use Topics  . Alcohol use: Yes    Comment: occ  . Drug use: No     Medication list has been reviewed and updated.  Current Meds  Medication Sig  . Accu-Chek FastClix Lancets MISC USE TO TEST BLOOD SUGAR TWICE DAILY  . ACCU-CHEK GUIDE test strip USE TO TEST BLOOD SUGAR TWICE DAILY  . amLODipine (NORVASC) 5 MG tablet TAKE 1 TABLET BY MOUTH AT BEDTIME  . ASPIRIN LOW DOSE 81 MG EC tablet TAKE 1 TABLET BY MOUTH EVERY DAY  . atorvastatin (LIPITOR) 40 MG tablet TAKE 1 TABLET BY MOUTH EVERY DAY  . Blood Glucose Monitoring Suppl (ACCU-CHEK GUIDE ME) w/Device KIT TO TEST TWICE DAILY  . Calcium Carbonate-Vitamin D 600-400 MG-UNIT tablet TAKE 1 TABLET BY MOUTH EVERY DAY  . chlorthalidone (HYGROTON) 25 MG tablet TAKE 1 TABLET BY MOUTH EVERY DAY  . DULoxetine (CYMBALTA) 60 MG capsule TAKE 1 CAPSULE(60 MG) BY MOUTH DAILY  . gabapentin (NEURONTIN) 300 MG capsule TAKE 2 CAPSULES(600 MG) BY MOUTH TWICE DAILY  . lactobacillus acidophilus (BACID) TABS tablet Take 2 tablets by mouth 3 (three) times daily.  . meloxicam (MOBIC) 15 MG tablet TAKE 1 TABLET(15 MG) BY MOUTH DAILY  . methocarbamol (ROBAXIN) 500 MG tablet TAKE 1 TABLET BY MOUTH FOUR TIMES DAILY  . metoprolol tartrate (LOPRESSOR) 50 MG tablet TAKE 1 TABLET BY MOUTH TWICE DAILY  . Multiple Minerals-Vitamins (CALCIUM & VIT D3 BONE HEALTH PO) Take by mouth. 600-400 mg  . nitroGLYCERIN (NITROSTAT) 0.4 MG SL tablet Place under the tongue.  Marland Kitchen omeprazole (PRILOSEC) 20 MG capsule TAKE 1 CAPSULE(20 MG) BY MOUTH TWICE DAILY BEFORE A MEAL  . potassium chloride SA  (KLOR-CON) 20 MEQ tablet TAKE 1 TABLET BY MOUTH EVERY DAY  . tamsulosin (FLOMAX) 0.4 MG CAPS capsule TAKE 1 CAPSULE BY MOUTH EVERY DAY  . traZODone (DESYREL) 50 MG tablet Take 1 tablet (50 mg total) by mouth at bedtime.  . VENTOLIN HFA 108 (90 Base) MCG/ACT inhaler INHALE 2 PUFFS INTO THE LUNGS EVERY 6 HOURS AS NEEDED FOR WHEEZING OR SHORTNESS OF BREATH  . Zinc 50 MG CAPS Take 220 mg by mouth.     PHQ 2/9 Scores 06/07/2020 03/09/2020 09/08/2019 09/03/2019  PHQ - 2 Score 6 4 0 0  PHQ- 9 Score _0 -    GAD 7 : Generalized Anxiety Score 06/07/2020 03/09/2020  Nervous, Anxious, on Edge 2 3  Control/stop worrying 3 0  Worry too much - different things 3 0  Trouble relaxing 1 1  Restless 0 3  Easily annoyed or irritable 3 2  Afraid - awful might happen 0 2  Total GAD 7 Score 12 11  Anxiety Difficulty Not difficult at all Somewhat difficult    BP Readings from Last 3 Encounters:  06/07/20 (!) 150/90  05/15/20 121/76  03/09/20 104/64    Physical Exam Vitals and nursing note reviewed.  Constitutional:      General: She is not in acute distress.    Appearance: She is well-developed.  HENT:     Head: Normocephalic and atraumatic.     Right Ear: Tympanic membrane and ear canal normal.     Left Ear: Tympanic membrane and ear canal normal.  Eyes:     Extraocular Movements:     Right eye: Nystagmus present.     Left eye: Nystagmus present.  Cardiovascular:     Rate and Rhythm: Normal rate and regular rhythm.  Pulmonary:     Effort: Pulmonary effort is normal. No respiratory distress.     Breath sounds: Normal breath sounds.  Musculoskeletal:     Right knee: Crepitus present. No effusion. Normal range of motion.     Left knee: Crepitus present.     Right lower leg: No edema.     Left lower leg: No edema.  Lymphadenopathy:     Cervical: No cervical adenopathy.  Skin:    General: Skin is warm and dry.     Findings: No rash.  Neurological:     Mental Status: She is alert and  oriented to person, place, and time.  Psychiatric:        Attention and Perception: Attention normal.        Mood and Affect: Mood normal.     Wt Readings from Last 3 Encounters:  06/07/20 157 lb (71.2 kg)  05/15/20 159 lb (72.1 kg)  03/09/20 162 lb (73.5 kg)    BP (!) 150/90   Pulse 71   Temp 98.6 F (37 C) (Oral)   Ht _0  (1.549 m)   Wt 157 lb (71.2 kg)   SpO2 97%   BMI 29.66 kg/m   Assessment and Plan: 1. Primary osteoarthritis of right knee Needs to consult Ortho  - Ambulatory referral to Orthopedic Surgery  2. [facility] PT has seen her for other issues and recommended vestibular PTX so will place an order - Ambulatory referral to Physical Therapy  3. Essential hypertension Not controlled today but was normal last month Likely elevated due to dizziness and recent death in the family Pt will monitor BP and continue current medications and follow up if not improving  4. Depression, major, recurrent, in partial remission (HCC) Continue Cymbalta 60 mg per day   Partially dictated using Editor, commissioning. Any errors are unintentional.  Halina Maidens, MD [facility]  06/07/2020

## 2020-06-21 ENCOUNTER — Other Ambulatory Visit: Payer: Self-pay

## 2020-06-21 ENCOUNTER — Ambulatory Visit (INDEPENDENT_AMBULATORY_CARE_PROVIDER_SITE_OTHER): Payer: Medicare Other

## 2020-06-21 VITALS — BP 112/78 | HR 87 | Temp 98.8°F | Resp 16 | Ht 61.0 in | Wt 152.4 lb

## 2020-06-21 DIAGNOSIS — Z0000 Encounter for general adult medical examination without abnormal findings: Secondary | ICD-10-CM

## 2020-06-21 DIAGNOSIS — Z780 Asymptomatic menopausal state: Secondary | ICD-10-CM | POA: Diagnosis not present

## 2020-06-21 DIAGNOSIS — Z599 Problem related to housing and economic circumstances, unspecified: Secondary | ICD-10-CM | POA: Diagnosis not present

## 2020-06-21 DIAGNOSIS — Z1231 Encounter for screening mammogram for malignant neoplasm of breast: Secondary | ICD-10-CM

## 2020-06-21 DIAGNOSIS — Z591 Inadequate housing: Secondary | ICD-10-CM

## 2020-06-21 NOTE — Patient Instructions (Signed)
Betty Vaughn , Thank you for taking time to come for your Medicare Wellness Visit. I appreciate your ongoing commitment to your health goals. Please review the following plan we discussed and let me know if I can assist you in the future.   Screening recommendations/referrals: Colonoscopy: done 06/12/17 Mammogram: done 09/13/19. Please call [phone] to schedule your mammogram and bone density screening.  Bone Density: done 09/23/18 Recommended yearly ophthalmology/optometry visit for glaucoma screening and checkup Recommended yearly dental visit for hygiene and checkup  Vaccinations: Influenza vaccine: done 08/27/19 Pneumococcal vaccine: done 06/21/19 Tdap vaccine: done 04/11/16 Shingles vaccine: Shingrix discussed. Please contact your pharmacy for coverage information.  Covid-19: done 01/21/20 & 02/18/20  Advanced directives: Please bring a copy of your health care power of attorney and living will to the office at your convenience once you have completed those documents.   Conditions/risks identified: Recommend increasing physical activity as tolerated.   Next appointment: Follow up in one year for your annual wellness visit    Preventive Care 65 Years and Older, Female Preventive care refers to lifestyle choices and visits with your health care provider that can promote health and wellness. What does preventive care include?  A yearly physical exam. This is also called an annual well check.  Dental exams once or twice a year.  Routine eye exams. Ask your health care provider how often you should have your eyes checked.  Personal lifestyle choices, including:  Daily care of your teeth and gums.  Regular physical activity.  Eating a healthy diet.  Avoiding tobacco and drug use.  Limiting alcohol use.  Practicing safe sex.  Taking low-dose aspirin every day.  Taking vitamin and mineral supplements as recommended by your health care provider. What happens during an  annual well check? The services and screenings done by your health care provider during your annual well check will depend on your age, overall health, lifestyle risk factors, and family history of disease. Counseling  Your health care provider may ask you questions about your:  Alcohol use.  Tobacco use.  Drug use.  Emotional well-being.  Home and relationship well-being.  Sexual activity.  Eating habits.  History of falls.  Memory and ability to understand (cognition).  Work and work Statistician.  Reproductive health. Screening  You may have the following tests or measurements:  Height, weight, and BMI.  Blood pressure.  Lipid and cholesterol levels. These may be checked every 5 years, or more frequently if you are over 55 years old.  Skin check.  Lung cancer screening. You may have this screening every year starting at age 27 if you have a 30-pack-year history of smoking and currently smoke or have quit within the past 15 years.  Fecal occult blood test (FOBT) of the stool. You may have this test every year starting at age 39.  Flexible sigmoidoscopy or colonoscopy. You may have a sigmoidoscopy every 5 years or a colonoscopy every 10 years starting at age 65.  Hepatitis C blood test.  Hepatitis B blood test.  Sexually transmitted disease (STD) testing.  Diabetes screening. This is done by checking your blood sugar (glucose) after you have not eaten for a while (fasting). You may have this done every 1-3 years.  Bone density scan. This is done to screen for osteoporosis. You may have this done starting at age 24.  Mammogram. This may be done every 1-2 years. Talk to your health care provider about how often you should have regular mammograms. Talk with your  health care provider about your test results, treatment options, and if necessary, the need for more tests. Vaccines  Your health care provider may recommend certain vaccines, such as:  Influenza  vaccine. This is recommended every year.  Tetanus, diphtheria, and acellular pertussis (Tdap, Td) vaccine. You may need a Td booster every 10 years.  Zoster vaccine. You may need this after age 64.  Pneumococcal 13-valent conjugate (PCV13) vaccine. One dose is recommended after age 43.  Pneumococcal polysaccharide (PPSV23) vaccine. One dose is recommended after age 56. Talk to your health care provider about which screenings and vaccines you need and how often you need them. This information is not intended to replace advice given to you by your health care provider. Make sure you discuss any questions you have with your health care provider. Document Released: 12/08/2015 Document Revised: 07/31/2016 Document Reviewed: 09/12/2015 Elsevier Interactive Patient Education  2017 [place] Prevention in the Home Falls can cause injuries. They can happen to people of all ages. There are many things you can do to make your home safe and to help prevent falls. What can I do on the outside of my home?  Regularly fix the edges of walkways and driveways and fix any cracks.  Remove anything that might make you trip as you walk through a door, such as a raised step or threshold.  Trim any bushes or trees on the path to your home.  Use bright outdoor lighting.  Clear any walking paths of anything that might make someone trip, such as rocks or tools.  Regularly check to see if handrails are loose or broken. Make sure that both sides of any steps have handrails.  Any raised decks and porches should have guardrails on the edges.  Have any leaves, snow, or ice cleared regularly.  Use sand or salt on walking paths during winter.  Clean up any spills in your garage right away. This includes oil or grease spills. What can I do in the bathroom?  Use night lights.  Install grab bars by the toilet and in the tub and shower. Do not use towel bars as grab bars.  Use non-skid mats or decals  in the tub or shower.  If you need to sit down in the shower, use a plastic, non-slip stool.  Keep the floor dry. Clean up any water that spills on the floor as soon as it happens.  Remove soap buildup in the tub or shower regularly.  Attach bath mats securely with double-sided non-slip rug tape.  Do not have throw rugs and other things on the floor that can make you trip. What can I do in the bedroom?  Use night lights.  Make sure that you have a light by your bed that is easy to reach.  Do not use any sheets or blankets that are too big for your bed. They should not hang down onto the floor.  Have a firm chair that has side arms. You can use this for support while you get dressed.  Do not have throw rugs and other things on the floor that can make you trip. What can I do in the kitchen?  Clean up any spills right away.  Avoid walking on wet floors.  Keep items that you use a lot in easy-to-reach places.  If you need to reach something above you, use a strong step stool that has a grab bar.  Keep electrical cords out of the way.  Do not use  floor polish or wax that makes floors slippery. If you must use wax, use non-skid floor wax.  Do not have throw rugs and other things on the floor that can make you trip. What can I do with my stairs?  Do not leave any items on the stairs.  Make sure that there are handrails on both sides of the stairs and use them. Fix handrails that are broken or loose. Make sure that handrails are as long as the stairways.  Check any carpeting to make sure that it is firmly attached to the stairs. Fix any carpet that is loose or worn.  Avoid having throw rugs at the top or bottom of the stairs. If you do have throw rugs, attach them to the floor with carpet tape.  Make sure that you have a light switch at the top of the stairs and the bottom of the stairs. If you do not have them, ask someone to add them for you. What else can I do to help  prevent falls?  Wear shoes that:  Do not have high heels.  Have rubber bottoms.  Are comfortable and fit you well.  Are closed at the toe. Do not wear sandals.  If you use a stepladder:  Make sure that it is fully opened. Do not climb a closed stepladder.  Make sure that both sides of the stepladder are locked into place.  Ask someone to hold it for you, if possible.  Clearly mark and make sure that you can see:  Any grab bars or handrails.  First and last steps.  Where the edge of each step is.  Use tools that help you move around (mobility aids) if they are needed. These include:  Canes.  Walkers.  Scooters.  Crutches.  Turn on the lights when you go into a dark area. Replace any light bulbs as soon as they burn out.  Set up your furniture so you have a clear path. Avoid moving your furniture around.  If any of your floors are uneven, fix them.  If there are any pets around you, be aware of where they are.  Review your medicines with your doctor. Some medicines can make you feel dizzy. This can increase your chance of falling. Ask your doctor what other things that you can do to help prevent falls. This information is not intended to replace advice given to you by your health care provider. Make sure you discuss any questions you have with your health care provider. Document Released: 09/07/2009 Document Revised: 04/18/2016 Document Reviewed: 12/16/2014 Elsevier Interactive Patient Education  2017 [employer].

## 2020-06-21 NOTE — Progress Notes (Signed)
Subjective:   Betty Vaughn is a 69 y.o. female who presents for Medicare Annual (Subsequent) preventive examination.  Review of Systems     Cardiac Risk Factors include: advanced age (>75mn, >>58women);diabetes mellitus;dyslipidemia;hypertension;smoking/ tobacco exposure     Objective:    Today's Vitals   06/21/20 1415  BP: 112/78  Pulse: 87  Resp: 16  Temp: 98.8 F (37.1 C)  TempSrc: Oral  SpO2: 96%  Weight: 152 lb 6.4 oz (69.1 kg)  Height: 5' 1" (1.549 m)   Body mass index is 28.8 kg/m.  Advanced Directives 06/21/2020 05/15/2020 06/21/2019 06/17/2018 06/12/2017 11/10/2016  Does Patient Have a Medical Advance Directive? Yes _0   Type of AParamedicof [place]ur[place]Living will - - - - -  Copy of HDe[place]lein Chart? No - copy requested - - - - -  Would patient like information on creating a medical advance directive? - - Yes (MAU/Ambulatory/Procedural Areas - Information given) Yes (MAU/Ambulatory/Procedural Areas - Information given) No - Patient declined -    Current Medications (verified) Outpatient Encounter Medications as of 06/21/2020  Medication Sig  . Accu-Chek FastClix Lancets MISC USE TO TEST BLOOD SUGAR TWICE DAILY  . ACCU-CHEK GUIDE test strip USE TO TEST BLOOD SUGAR TWICE DAILY  . amLODipine (NORVASC) 5 MG tablet TAKE 1 TABLET BY MOUTH AT BEDTIME  . Apoaequorin (PREVAGEN PO) Take by mouth.  . ASPIRIN LOW DOSE 81 MG EC tablet TAKE 1 TABLET BY MOUTH EVERY DAY  . atorvastatin (LIPITOR) 40 MG tablet TAKE 1 TABLET BY MOUTH EVERY DAY  . Blood Glucose Monitoring Suppl (ACCU-CHEK GUIDE ME) w/Device KIT TO TEST TWICE DAILY  . Calcium Carbonate-Vitamin D 600-400 MG-UNIT tablet TAKE 1 TABLET BY MOUTH EVERY DAY  . chlorthalidone (HYGROTON) 25 MG tablet TAKE 1 TABLET BY MOUTH EVERY DAY  . DULoxetine (CYMBALTA) 60 MG capsule TAKE 1 CAPSULE(60 MG) BY MOUTH DAILY  . gabapentin (NEURONTIN) 300 MG capsule TAKE 2 CAPSULES(600  MG) BY MOUTH TWICE DAILY  . lactobacillus acidophilus (BACID) TABS tablet Take 2 tablets by mouth 3 (three) times daily.  . meloxicam (MOBIC) 15 MG tablet TAKE 1 TABLET(15 MG) BY MOUTH DAILY  . methocarbamol (ROBAXIN) 500 MG tablet TAKE 1 TABLET BY MOUTH FOUR TIMES DAILY  . metoprolol tartrate (LOPRESSOR) 50 MG tablet TAKE 1 TABLET BY MOUTH TWICE DAILY  . Multiple Minerals-Vitamins (CALCIUM & VIT D3 BONE HEALTH PO) Take by mouth. 600-400 mg  . nitroGLYCERIN (NITROSTAT) 0.4 MG SL tablet Place under the tongue.  .Marland Kitchenomeprazole (PRILOSEC) 20 MG capsule TAKE 1 CAPSULE(20 MG) BY MOUTH TWICE DAILY BEFORE A MEAL  . potassium chloride SA (KLOR-CON) 20 MEQ tablet TAKE 1 TABLET BY MOUTH EVERY DAY  . tamsulosin (FLOMAX) 0.4 MG CAPS capsule TAKE 1 CAPSULE BY MOUTH EVERY DAY  . traZODone (DESYREL) 50 MG tablet Take 1 tablet (50 mg total) by mouth at bedtime.  . VENTOLIN HFA 108 (90 Base) MCG/ACT inhaler INHALE 2 PUFFS INTO THE LUNGS EVERY 6 HOURS AS NEEDED FOR WHEEZING OR SHORTNESS OF BREATH  . [DISCONTINUED] Zinc 50 MG CAPS Take 220 mg by mouth.    No facility-administered encounter medications on file as of 06/21/2020.    Allergies (verified) Azithromycin, Propoxyphene, Tramadol, Penicillins, Piroxicam, and Sertraline hcl   History: Past Medical History:  Diagnosis Date  . Allergy   . Arthritis   . Chronic insomnia   . [place]    polyp was cancerous  .  Depression   . Diabetes mellitus without complication ([place])   . GERD (gastroesophageal reflux disease)   . Hx of colonic polyps   . Hyperlipidemia   . Hypertension   . Neuromuscular disorder ([place])   . Psoriasis    scalp   Past Surgical History:  Procedure Laterality Date  . ABDOMINAL HYSTERECTOMY     total  . CERVICAL FUSION  2005  . CERVICAL FUSION  01/2018   C2-T2 posterior laminectomy and fusion; removal of previous hardware  . CHOLECYSTECTOMY    . COLONOSCOPY WITH PROPOFOL N/A 06/12/2017   Procedure: COLONOSCOPY WITH  PROPOFOL;  Surgeon: Lucilla Lame, MD;  Location: [facility];  Service: Gastroenterology;  Laterality: N/A;  . 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"  . HAND SURGERY Bilateral 2001  . POLYPECTOMY  2000  . POLYPECTOMY N/A 06/12/2017   Procedure: POLYPECTOMY;  Surgeon: Lucilla Lame, MD;  Location: [facility];  Service: Gastroenterology;  Laterality: N/A;  . rigger finger     Family History  Problem Relation Age of Onset  . Breast cancer Mother 18  . Diabetes Mother   . CAD Father   . Heart attack Father   . Diabetes Daughter   . Kidney failure Daughter   . Breast cancer Sister 51  . Sickle cell anemia Other   . Cancer Brother        lung  . Heart attack Sister   . Cancer Sister        ovarian  . Kidney cancer Neg Hx   . Bladder Cancer Neg Hx    Social History   Socioeconomic History  . Marital status: Divorced    Spouse name: Not on file  . Number of children: 3  . Years of education: Not on file  . Highest education level: 12th grade  Occupational History  . Occupation: Retired  Tobacco Use  . Smoking status: Former Smoker    Packs/day: 0.25    Years: 30.00    Pack years: 7.50    Types: Cigarettes    Quit date: 03/25/2020    Years since quitting: 0.2  . Smokeless tobacco: Never Used  Vaping Use  . Vaping Use: Every day  Substance and Sexual Activity  . Alcohol use: Yes    Alcohol/week: 1.0 standard drink    Types: 1 Glasses of wine per week    Comment: occ  . Drug use: No  . Sexual activity: Not Currently  Other Topics Concern  . Not on file  Social History Narrative  . Not on file   Social Determinants of Health   Financial Resource Strain: High Risk  . Difficulty of Paying Living Expenses: Hard  Food Insecurity: Food Insecurity Present  . Worried About Charity fundraiser in the Last Year: Sometimes true  . Ran Out of Food in the Last Year: Sometimes true  Transportation Needs: No Transportation Needs  . Lack of  Transportation (Medical): No  . Lack of Transportation (Non-Medical): No  Physical Activity: Insufficiently Active  . Days of Exercise per Week: 3 days  . Minutes of Exercise per Session: 20 min  Stress: No Stress Concern Present  . Feeling of Stress : Only a little  Social Connections: Moderately Isolated  . Frequency of Communication with Friends and Family: More than three times a week  . Frequency of Social Gatherings with Friends and Family: Once a week  . Attends Religious Services: More than 4 times per year  . Active  Member of Clubs or Organizations: No  . Attends Archivist Meetings: Never  . Marital Status: Divorced    Tobacco Counseling Counseling given: Not Answered   Clinical Intake:  Pre-visit preparation completed: Yes  Pain : No/denies pain     BMI - recorded: 28.8 Nutritional Risks: None Diabetes: Yes CBG done?: No Did pt. bring in CBG monitor from home?: No  How often do you need to have someone help you when you read instructions, pamphlets, or other written materials from your doctor or pharmacy?: 1 - Never  Nutrition Risk Assessment:  Has the patient had any N/V/D within the last 2 months?  No  Does the patient have any non-healing wounds?  No  Has the patient had any unintentional weight loss or weight gain?  No   Diabetes:  Is the patient diabetic?  Yes  If diabetic, was a CBG obtained today?  No  Did the patient bring in their glucometer from home?  No  How often do you monitor your CBG's? Twice daily.   Financial Strains and Diabetes Management:  Are you having any financial strains with the device, your supplies or your medication? No .  Does the patient want to be seen by Chronic Care Management for management of their diabetes?  No  Would the patient like to be referred to a Nutritionist or for Diabetic Management?  No   Diabetic Exams:  Diabetic Eye Exam: Completed 01/22/18. Overdue for diabetic eye exam. Pt has been  advised about the importance in completing this exam.   Diabetic Foot Exam: DUE. Pt has been advised about the importance in completing this exam.   Interpreter Needed?: No  Information entered by :: Clemetine Marker LPN   Activities of Daily Living In your present state of health, do you have any difficulty performing the following activities: 06/21/2020  Hearing? N  Comment declines hearing aids  Vision? N  Difficulty concentrating or making decisions? Y  Walking or climbing stairs? N  Dressing or bathing? N  Doing errands, shopping? N  Preparing Food and eating ? N  Using the Toilet? N  In the past six months, have you accidently leaked urine? N  Do you have problems with loss of bowel control? N  Managing your Medications? N  Managing your Finances? N  Housekeeping or managing your Housekeeping? N  Some recent data might be hidden    Patient Care Team: Glean Hess, MD as PCP - General (Internal Medicine) Bayard Hugger, MD as Consulting Physician (Neurosurgery) Laneta Simmers as Physician Assistant (Urology)  Indicate any recent Medical Services you may have received from other than [facility] providers in the past year (date may be approximate).     Assessment:   This is a routine wellness examination for Maleigh.  Hearing/Vision screen  Hearing Screening   125Hz 250Hz 500Hz 1000Hz 2000Hz 3000Hz 4000Hz 6000Hz 8000Hz  Right ear:           Left ear:           Comments: Pt denies hearing difficulty  Vision Screening Comments: [facility]  Dietary issues and exercise activities discussed: Current Exercise Habits: Home exercise routine, Type of exercise: stretching;calisthenics, Time (Minutes): 20, Frequency (Times/Week): 3, Weekly Exercise (Minutes/Week): 60, Intensity: Mild, Exercise limited by: None identified  Goals    . DIET - INCREASE WATER INTAKE     Recommend to drink at least 6-8 8oz glasses of water per  day.       Depression Screen PHQ 2/9 Scores 06/21/2020 06/07/2020 03/09/2020 09/08/2019 09/03/2019 06/21/2019 03/02/2019  PHQ - 2 Score _0 0 0 2 5  PHQ- 9 Score _1 - 11 16    Fall Risk Fall Risk  06/21/2020 06/07/2020 03/09/2020 06/21/2019 03/02/2019  Falls in the past year? 1 1 0 1 0  Number falls in past yr: 0 0 0 1 0  Injury with Fall? 0 0 0 0 0  Risk for fall due to : History of fall(s) - No Fall Risks Impaired balance/gait -  Risk for fall due to: Comment - - - - -  Follow up Falls prevention discussed Falls evaluation completed Falls evaluation completed Falls prevention discussed Falls evaluation completed    Any stairs in or around the home? Yes  If so, are there any without handrails? No  Home free of loose throw rugs in walkways, pet beds, electrical cords, etc? Yes  Adequate lighting in your home to reduce risk of falls? Yes   ASSISTIVE DEVICES UTILIZED TO PREVENT FALLS:  Life alert? No  Use of a cane, walker or w/c? No  Grab bars in the bathroom? No  Shower chair or bench in shower? No  Elevated toilet seat or a handicapped toilet? No   TIMED UP AND GO:  Was the test performed? Yes .  Length of time to ambulate 10 feet: 5 sec.   Gait steady and fast without use of assistive device  Cognitive Function:     6CIT Screen 06/21/2020 04/28/2017  What Year? 0 points 0 points  What month? 0 points 0 points  What time? 0 points 0 points  Count back from 20 0 points 0 points  Months in reverse 0 points 0 points  Repeat phrase 2 points 0 points  Total Score 2 0    Immunizations Immunization History  Administered Date(s) Administered  . H1N1 07/31/2017  . Influenza, High Dose Seasonal PF 08/29/2017, 09/22/2018, 08/27/2019  . Influenza-Unspecified 08/29/2017  . Moderna SARS-COVID-2 Vaccination 01/21/2020, 02/18/2020  . PPD Test 06/19/2015  . Pneumococcal Conjugate-13 06/17/2018  . Pneumococcal Polysaccharide-23 06/21/2019  . Tdap 04/11/2016  . Zoster 06/12/2016     TDAP status: Up to date   Flu Vaccine status: Up to date   Pneumococcal vaccine status: Up to date   Covid-19 vaccine status: Completed vaccines  Qualifies for Shingles Vaccine? Yes   Zostavax completed No   Shingrix Completed?: No.    Education has been provided regarding the importance of this vaccine. Patient has been advised to call insurance company to determine out of pocket expense if they have not yet received this vaccine. Advised may also receive vaccine at local pharmacy or Health Dept. Verbalized acceptance and understanding.  Screening Tests Health Maintenance  Topic Date Due  . INFLUENZA VACCINE  06/25/2020  . URINE MICROALBUMIN  09/02/2020  . MAMMOGRAM  09/12/2020  . COLONOSCOPY  06/12/2022  . TETANUS/TDAP  04/11/2026  . DEXA SCAN  Completed  . COVID-19 Vaccine  Completed  . Hepatitis C Screening  Completed  . PNA vac Low Risk Adult  Completed    Health Maintenance  There are no preventive care reminders to display for this patient.  Colorectal cancer screening: Completed 06/12/17. Repeat every 5 years   Mammogram status: Completed 09/13/19. Repeat every year    Bone Density status: Completed 09/23/18. Results reflect: Bone density results: OSTEOPENIA. Repeat every 2 years.  Lung Cancer Screening: (Low Dose  CT Chest recommended if Age 25-80 years, 30 pack-year currently smoking OR have quit w/in 15years.) does not qualify.   Additional Screening:  Hepatitis C Screening: does qualify; Completed 04/10/16   Vision Screening: Recommended annual ophthalmology exams for early detection of glaucoma and other disorders of the eye. Is the patient up to date with their annual eye exam?  No  Who is the provider or what is the name of the office in which the patient attends annual eye exams? [place] Screening: Recommended annual dental exams for proper oral hygiene  Community Resource Referral / Chronic Care Management: CRR required this  visit?  Yes   CCM required this visit?  No      Plan:     I have personally reviewed and noted the following in the patient's chart:   . Medical and social history . Use of alcohol, tobacco or illicit drugs  . Current medications and supplements . Functional ability and status . Nutritional status . Physical activity . Advanced directives . List of other physicians . Hospitalizations, surgeries, and ER visits in previous 12 months . Vitals . Screenings to include cognitive, depression, and falls . Referrals and appointments  In addition, I have reviewed and discussed with patient certain preventive protocols, quality metrics, and best practice recommendations. A written personalized care plan for preventive services as well as general preventive health recommendations were provided to patient.     Clemetine Marker, LPN   [id number]   Nurse Notes:  Pt advised Chassidy contacted referral coordinator to check status of physical therapy referral

## 2020-06-25 ENCOUNTER — Other Ambulatory Visit: Payer: Self-pay | Admitting: Internal Medicine

## 2020-06-25 DIAGNOSIS — G629 Polyneuropathy, unspecified: Secondary | ICD-10-CM

## 2020-06-25 NOTE — Telephone Encounter (Signed)
Requested Prescriptions  Pending Prescriptions Disp Refills  . ASPIRIN LOW DOSE 81 MG EC tablet [Pharmacy Med Name: ASPIRIN 81MG  EC LOW DOSE  TABLETS] 90 tablet 1    Sig: TAKE 1 TABLET BY MOUTH EVERY DAY     Analgesics:  NSAIDS - aspirin Passed - 06/25/2020  6:29 AM      Passed - Patient is not pregnant      Passed - Valid encounter within last 12 months    Recent Outpatient Visits          2 weeks ago Primary osteoarthritis of right knee   [facility] Glean Hess, MD   3 months ago Glucose intolerance (impaired glucose tolerance)   [facility] Glean Hess, MD   9 months ago Annual physical exam   [facility] Glean Hess, MD   9 months ago Myofascial muscle pain   [facility] Glean Hess, MD   1 year ago Depression, major, recurrent, in partial remission [facility])   [facility] Glean Hess, MD      Future Appointments            In 3 months [name] [name], MD [facility] [place]           . gabapentin (NEURONTIN) 300 MG capsule [Pharmacy Med Name: GABAPENTIN 300MG  CAPSULES] 360 capsule 1    Sig: TAKE 2 CAPSULES(600 MG) BY MOUTH TWICE DAILY     Neurology: Anticonvulsants - gabapentin Passed - 06/25/2020  6:29 AM      Passed - Valid encounter within last 12 months    Recent Outpatient Visits          2 weeks ago Primary osteoarthritis of right knee   [facility] Glean Hess, MD   3 months ago Glucose intolerance (impaired glucose tolerance)   [facility] Glean Hess, MD   9 months ago Annual physical exam   [facility] Glean Hess, MD   9 months ago Myofascial muscle pain   [facility] Glean Hess, MD   1 year ago Depression, major, recurrent, in partial remission [facility])   [facility] Glean Hess, MD      Future Appointments            In 3 months [name] [name], MD [facility]

## 2020-07-05 ENCOUNTER — Telehealth: Payer: Self-pay | Admitting: Internal Medicine

## 2020-07-10 ENCOUNTER — Telehealth: Payer: Self-pay | Admitting: Internal Medicine

## 2020-07-13 ENCOUNTER — Emergency Department: Payer: Medicare Other

## 2020-07-13 ENCOUNTER — Telehealth: Payer: Self-pay | Admitting: *Deleted

## 2020-07-13 ENCOUNTER — Ambulatory Visit: Payer: Self-pay | Admitting: *Deleted

## 2020-07-13 ENCOUNTER — Other Ambulatory Visit: Payer: Self-pay

## 2020-07-13 DIAGNOSIS — I10 Essential (primary) hypertension: Secondary | ICD-10-CM | POA: Diagnosis not present

## 2020-07-13 DIAGNOSIS — E782 Mixed hyperlipidemia: Secondary | ICD-10-CM | POA: Insufficient documentation

## 2020-07-13 DIAGNOSIS — Z79899 Other long term (current) drug therapy: Secondary | ICD-10-CM | POA: Diagnosis not present

## 2020-07-13 DIAGNOSIS — Z7984 Long term (current) use of oral hypoglycemic drugs: Secondary | ICD-10-CM | POA: Insufficient documentation

## 2020-07-13 DIAGNOSIS — Z7982 Long term (current) use of aspirin: Secondary | ICD-10-CM | POA: Insufficient documentation

## 2020-07-13 DIAGNOSIS — E1140 Type 2 diabetes mellitus with diabetic neuropathy, unspecified: Secondary | ICD-10-CM | POA: Diagnosis not present

## 2020-07-13 DIAGNOSIS — Z87891 Personal history of nicotine dependence: Secondary | ICD-10-CM | POA: Insufficient documentation

## 2020-07-13 DIAGNOSIS — I2510 Atherosclerotic heart disease of native coronary artery without angina pectoris: Secondary | ICD-10-CM | POA: Insufficient documentation

## 2020-07-13 DIAGNOSIS — R200 Anesthesia of skin: Secondary | ICD-10-CM | POA: Insufficient documentation

## 2020-07-13 DIAGNOSIS — Z85038 Personal history of other malignant neoplasm of large intestine: Secondary | ICD-10-CM | POA: Diagnosis not present

## 2020-07-13 DIAGNOSIS — Z20822 Contact with and (suspected) exposure to covid-19: Secondary | ICD-10-CM | POA: Diagnosis not present

## 2020-07-13 LAB — DIFFERENTIAL
Abs Immature Granulocytes: 0.03 10*3/uL (ref 0.00–0.07)
Basophils Absolute: 0 10*3/uL (ref 0.0–0.1)
Basophils Relative: 0 %
Eosinophils Absolute: 0.1 10*3/uL (ref 0.0–0.5)
Eosinophils Relative: 1 %
Immature Granulocytes: 0 %
Lymphocytes Relative: 26 %
Lymphs Abs: 2 10*3/uL (ref 0.7–4.0)
Monocytes Absolute: 0.5 10*3/uL (ref 0.1–1.0)
Monocytes Relative: 6 %
Neutro Abs: 4.9 10*3/uL (ref 1.7–7.7)
Neutrophils Relative %: 67 %

## 2020-07-13 LAB — COMPREHENSIVE METABOLIC PANEL
ALT: 28 U/L (ref 0–44)
AST: 31 U/L (ref 15–41)
Albumin: 4.2 g/dL (ref 3.5–5.0)
Alkaline Phosphatase: 79 U/L (ref 38–126)
Anion gap: 10 (ref 5–15)
BUN: 15 mg/dL (ref 8–23)
CO2: 28 mmol/L (ref 22–32)
Calcium: 9.4 mg/dL (ref 8.9–10.3)
Chloride: 98 mmol/L (ref 98–111)
Creatinine, Ser: 1.01 mg/dL — ABNORMAL HIGH (ref 0.44–1.00)
GFR calc Af Amer: >60 mL/min (ref >60)
GFR calc non Af Amer: 57 mL/min — ABNORMAL LOW (ref >60)
Glucose, Bld: 104 mg/dL — ABNORMAL HIGH (ref 70–99)
Potassium: 3.7 mmol/L (ref 3.5–5.1)
Sodium: 136 mmol/L (ref 135–145)
Total Bilirubin: 0.8 mg/dL (ref 0.3–1.2)
Total Protein: 7.7 g/dL (ref 6.5–8.1)

## 2020-07-13 LAB — CBC
HCT: 36.9 % (ref 36.0–46.0)
Hemoglobin: 12.2 g/dL (ref 12.0–15.0)
MCH: 31.4 pg (ref 26.0–34.0)
MCHC: 33.1 g/dL (ref 30.0–36.0)
MCV: 95.1 fL (ref 80.0–100.0)
Platelets: 227 10*3/uL (ref 150–400)
RBC: 3.88 MIL/uL (ref 3.87–5.11)
RDW: 12.9 % (ref 11.5–15.5)
WBC: 7.5 10*3/uL (ref 4.0–10.5)
nRBC: 0 % (ref 0.0–0.2)

## 2020-07-13 LAB — GLUCOSE, CAPILLARY: Glucose-Capillary: 101 mg/dL — ABNORMAL HIGH (ref 70–99)

## 2020-07-13 LAB — APTT: aPTT: 26 seconds (ref 24–36)

## 2020-07-13 LAB — PROTIME-INR
INR: 1 (ref 0.8–1.2)
Prothrombin Time: 12.5 seconds (ref 11.4–15.2)

## 2020-07-13 MED ORDER — SODIUM CHLORIDE 0.9% FLUSH
3.0000 mL | Freq: Once | INTRAVENOUS | Status: DC
Start: 2020-07-13 — End: 2020-07-14

## 2020-07-13 NOTE — Telephone Encounter (Signed)
YES, patient needs to call 911. Noted.   Thank you !!

## 2020-07-13 NOTE — Telephone Encounter (Addendum)
Patient calls with right-sided face and tongue numbness/tingling since last night. Having difficulty swallowing without getting choked today. Right-sided weakness with arm drift and leaning to the right. Headache and blurred vision today also. Had patient take 3 baby asa and instructed to call 911 immediately. Called her daughter, Azucena Kuba as requested by the patient. No answer left VM to call her mother at this time that she had been instructed to call 911 for medical evaluation. Will call patient in 15 minutes to assure she contacted EMS. TC to patient to ensure she called 911. No answer. Reason for Disposition . [1] Numbness (i.e., loss of sensation) of the face, arm / hand, or leg / foot on one side of the body AND [2] sudden onset AND [3] present now  Answer Assessment - Initial Assessment Questions 1. SYMPTOM: "What is the main symptom you are concerned about?" (e.g., weakness, numbness)     Right sided face and tongue 2. ONSET: "When did this start?" (minutes, hours, days; while sleeping)     Yesterday 3. LAST NORMAL: "When was the last time you were normal (no symptoms)?"     Last week  4. PATTERN "Does this come and go, or has it been constant since it started?"  "Is it present now?"     constant 5. CARDIAC SYMPTOMS: "Have you had any of the following symptoms: chest pain, difficulty breathing, palpitations?"     Chest pain radiating to both arms 6. NEUROLOGIC SYMPTOMS: "Have you had any of the following symptoms: headache, dizziness, vision loss, double vision, changes in speech, unsteady on your feet?"     Headache, blurred vision, leaning toward the right. 7. OTHER SYMPTOMS: "Do you have any other symptoms?"     Weakness all over 8. PREGNANCY: "Is there any chance you are pregnant?" "When was your last menstrual period?"     na  Protocols used: NEUROLOGIC DEFICIT-A-AH

## 2020-07-13 NOTE — Telephone Encounter (Signed)
Call to patient follow-up on earlier triage. Patient's daughter at her side. Reporting EMS saw patient-told patient she was not having a heart attack the patient signed paper not to go to hospital. Daughter reporting patient appears confused, having difficulty with her speech and swallowing along with right arm drift and leaning to the right. Advised to call 911 again and make sure patient is taken to the ED to rule out stroke.

## 2020-07-13 NOTE — ED Triage Notes (Signed)
Pt in via EMS from home with c/o weakness. EMS has responded to pts home x's 2. Pt daughter reports that she spoke with pts PMD who told them to come to the ED. Pt grips equal bilaterally. Pt reports has not taken meds, ate much or slept much  HR 70, 99% RA, 134/74, FSBS 128

## 2020-07-13 NOTE — Telephone Encounter (Signed)
See amendment to NT call.

## 2020-07-14 ENCOUNTER — Emergency Department
Admission: EM | Admit: 2020-07-14 | Discharge: 2020-07-14 | Disposition: A | Discharge status: Home / Self Care | Payer: Medicare Other | Attending: Emergency Medicine | Admitting: Emergency Medicine

## 2020-07-14 DIAGNOSIS — R200 Anesthesia of skin: Secondary | ICD-10-CM

## 2020-07-14 LAB — TROPONIN I (HIGH SENSITIVITY): Troponin I (High Sensitivity): 3 ng/L (ref <18)

## 2020-07-14 LAB — SARS CORONAVIRUS 2 BY RT PCR (HOSPITAL ORDER, PERFORMED IN ~~LOC~~ HOSPITAL LAB): SARS Coronavirus 2: NEGATIVE

## 2020-07-14 NOTE — Discharge Instructions (Signed)
Follow-up with your doctor in 3 days.  Return to the emergency room if you have headache, unilateral weakness or numbness, facial droop, slurred speech, changes in your vision, difficulty walking.

## 2020-07-14 NOTE — ED Provider Notes (Signed)
[facility] Emergency Department Provider Note  ____________________________________________  Time seen: Approximately 2:37 AM  I have reviewed the triage vital signs and the nursing notes.   HISTORY  Chief Complaint Oral Swelling   HPI Betty Vaughn is a 69 y.o. female with a history of hyperlipidemia, hypertension, cervical fusion, diabetes, neuropathy who presents for evaluation of facial and tongue numbness.   Patient reports that yesterday the right side of her tongue and the right side of her face was numb.  That resolved but while in the waiting room she reports that the numbness returned.  This time the whole tongue and the whole face felt numb.  At this time his symptoms have fully resolved.  She denies facial droop, diplopia, dysarthria, dysphagia, unilateral weakness or numbness, dizziness, gait instability, headache.  Patient reports having a similar episode 2 years ago with a negative work-up.  Past Medical History:  Diagnosis Date  . Allergy   . Arthritis   . Chronic insomnia   . Colon cancer ([place])    polyp was cancerous  . Depression   . Diabetes mellitus without complication ([place])   . GERD (gastroesophageal reflux disease)   . Hx of colonic polyps   . Hyperlipidemia   . Hypertension   . Neuromuscular disorder ([place])   . Psoriasis    scalp    Patient Active Problem List   Diagnosis Date Noted  . Myofascial muscle pain 01/22/2019  . Weakness of both arms 09/08/2018  . Renal insufficiency 09/08/2018  . 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 Cervical stenosis of spinal canal 01/14/2018  . Personal history of colonic polyps   . Rectal polyp   . Tobacco use disorder 11/20/2016  . Neuropathy 04/10/2016  . Glucose intolerance (impaired glucose tolerance) 05/13/2015  . Benign essential tremor 05/13/2015  . Chronic insomnia 05/13/2015  . 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 Multiple joint complaints 05/13/2015  .  Depression, major, recurrent, in partial remission ([place]) 02/23/2014  . Acid reflux 02/23/2014  . Essential hypertension 02/23/2014  . Mixed hyperlipidemia 02/23/2014  . Chronic headache disorder 01/16/2012  . 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 CAD S/P percutaneous coronary angioplasty 03/16/2010    Past Surgical History:  Procedure Laterality Date  . ABDOMINAL HYSTERECTOMY     total  . CERVICAL FUSION  2005  . CERVICAL FUSION  01/2018   C2-T2 posterior laminectomy and fusion; removal of previous hardware  . CHOLECYSTECTOMY    . COLONOSCOPY WITH PROPOFOL N/A 06/12/2017   Procedure: COLONOSCOPY WITH PROPOFOL;  Surgeon: Lucilla Lame, MD;  Location: [place];  Service: Gastroenterology;  Laterality: N/A;  . 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"  . HAND SURGERY Bilateral 2001  . POLYPECTOMY  2000  . POLYPECTOMY N/A 06/12/2017   Procedure: POLYPECTOMY;  Surgeon: Lucilla Lame, MD;  Location: [place];  Service: Gastroenterology;  Laterality: N/A;  . rigger finger      Prior to Admission medications   Medication Sig Start Date End Date Taking? Authorizing Provider  Accu-Chek FastClix Lancets MISC USE TO TEST BLOOD SUGAR TWICE DAILY 06/19/19   Glean Hess, MD  ACCU-CHEK GUIDE test strip USE TO TEST BLOOD SUGAR TWICE DAILY 06/19/19   Glean Hess, MD  amLODipine (NORVASC) 5 MG tablet TAKE 1 TABLET BY MOUTH AT BEDTIME 04/26/20   Glean Hess, MD  Apoaequorin (PREVAGEN PO) Take by mouth.    [provider]  ASPIRIN LOW DOSE 81 MG EC  tablet TAKE 1 TABLET BY MOUTH EVERY DAY 06/25/20   Glean Hess, MD  atorvastatin (LIPITOR) 40 MG tablet TAKE 1 TABLET BY MOUTH EVERY DAY 04/26/20   Glean Hess, MD  Blood Glucose Monitoring Suppl (ACCU-CHEK GUIDE ME) w/Device KIT TO TEST TWICE DAILY 06/19/19   Glean Hess, MD  Calcium Carbonate-Vitamin D 600-400 MG-UNIT tablet TAKE 1 TABLET BY MOUTH EVERY DAY 09/25/19   Glean Hess, MD   chlorthalidone (HYGROTON) 25 MG tablet TAKE 1 TABLET BY MOUTH EVERY DAY 04/26/20   Glean Hess, MD  DULoxetine (CYMBALTA) 60 MG capsule TAKE 1 CAPSULE(60 MG) BY MOUTH DAILY 03/27/20   Glean Hess, MD  gabapentin (NEURONTIN) 300 MG capsule TAKE 2 CAPSULES(600 MG) BY MOUTH TWICE DAILY 06/25/20   Glean Hess, MD  lactobacillus acidophilus (BACID) TABS tablet Take 2 tablets by mouth 3 (three) times daily.    [provider]  meloxicam (MOBIC) 15 MG tablet TAKE 1 TABLET(15 MG) BY MOUTH DAILY 05/26/20   Glean Hess, MD  methocarbamol (ROBAXIN) 500 MG tablet TAKE 1 TABLET BY MOUTH FOUR TIMES DAILY 02/26/20   Glean Hess, MD  metoprolol tartrate (LOPRESSOR) 50 MG tablet TAKE 1 TABLET BY MOUTH TWICE DAILY 10/25/19   Glean Hess, MD  Multiple Minerals-Vitamins (CALCIUM & VIT D3 BONE HEALTH PO) Take by mouth. 600-400 mg    [provider]  nitroGLYCERIN (NITROSTAT) 0.4 MG SL tablet Place under the tongue.    [provider]  omeprazole (PRILOSEC) 20 MG capsule TAKE 1 CAPSULE(20 MG) BY MOUTH TWICE DAILY BEFORE A MEAL 05/26/20   Glean Hess, MD  potassium chloride SA (KLOR-CON) 20 MEQ tablet TAKE 1 TABLET BY MOUTH EVERY DAY 04/26/20   Glean Hess, MD  tamsulosin (FLOMAX) 0.4 MG CAPS capsule TAKE 1 CAPSULE BY MOUTH EVERY DAY 07/28/19   Glean Hess, MD  traZODone (DESYREL) 50 MG tablet Take 1 tablet (50 mg total) by mouth at bedtime. 03/09/20   Glean Hess, MD  VENTOLIN HFA 108 [phone] Base) MCG/ACT inhaler INHALE 2 PUFFS INTO THE LUNGS EVERY 6 HOURS AS NEEDED FOR WHEEZING OR SHORTNESS OF BREATH 04/26/19   Glean Hess, MD    Allergies Azithromycin, Propoxyphene, Tramadol, Penicillins, Piroxicam, and Sertraline hcl  Family History  Problem Relation Age of Onset  . Breast cancer Mother 31  . Diabetes Mother   . CAD Father   . Heart attack Father   . Diabetes Daughter   . Kidney failure Daughter   . Breast cancer Sister 21  . Sickle  cell anemia Other   . Cancer Brother        lung  . Heart attack Sister   . Cancer Sister        ovarian  . Kidney cancer Neg Hx   . Bladder Cancer Neg Hx     Social History Social History   Tobacco Use  . Smoking status: Former Smoker    Packs/day: 0.25    Years: 30.00    Pack years: 7.50    Types: Cigarettes    Quit date: 03/25/2020    Years since quitting: 0.3  . Smokeless tobacco: Never Used  Vaping Use  . Vaping Use: Every day  Substance Use Topics  . Alcohol use: Yes    Alcohol/week: 1.0 standard drink    Types: 1 Glasses of wine per week    Comment: occ  . Drug use: No    Review  of Systems  Constitutional: Negative for fever. Eyes: Negative for visual changes. ENT: Negative for sore throat. Neck: No neck pain  Cardiovascular: Negative for chest pain. Respiratory: Negative for shortness of breath. Gastrointestinal: Negative for abdominal pain, vomiting or diarrhea. Genitourinary: Negative for dysuria. Musculoskeletal: Negative for back pain. Skin: Negative for rash. Neurological: Negative for headaches, + facial and tongue numbness Psych: No SI or HI  ____________________________________________   PHYSICAL EXAM:  VITAL SIGNS: ED Triage Vitals  Enc Vitals Group     BP 07/13/20 1530 115/79     Pulse Rate 07/13/20 1530 60     Resp 07/13/20 1530 18     Temp 07/13/20 1530 98.4 F (36.9 C)     Temp Source 07/13/20 1530 Oral     SpO2 07/13/20 1530 99 %     Weight 07/13/20 1515 145 lb (65.8 kg)     Height 07/13/20 1515 5' 1"  (1.549 m)     Head Circumference --      Peak Flow --      Pain Score 07/13/20 1515 8     Pain Loc --      Pain Edu? --      Excl. in [place]? --     Constitutional: Alert and oriented. Well appearing and in no apparent distress. HEENT:      Head: Normocephalic and atraumatic.         Eyes: Conjunctivae are normal. Sclera is non-icteric.       Mouth/Throat: Mucous membranes are moist.  Tongue is midline with no swelling, no  erythema, uvula is midline, no peritonsillar abscess, floor of the mouth is soft, no trismus, no induration      Neck: Supple with no signs of meningismus. Cardiovascular: Regular rate and rhythm. No murmurs, gallops, or rubs.  Respiratory: Normal respiratory effort. Lungs are clear to auscultation bilaterally. Gastrointestinal: Soft, non tender. Musculoskeletal: No edema, cyanosis, or erythema of extremities. Neurologic: Normal speech and language. Face is symmetric.  No dysarthria, normal bilateral elevation of the palate, normal movement of the tongue, intact strength and sensation x4, no dysmetria, no pronator drift, normal gait Skin: Skin is warm, dry and intact. No rash noted. Psychiatric: Mood and affect are normal. Speech and behavior are normal.  ____________________________________________   LABS (all labs ordered are listed, but only abnormal results are displayed)  Labs Reviewed  COMPREHENSIVE METABOLIC PANEL - Abnormal; Notable for the following components:      Result Value   Glucose, Bld 104 (*)    Creatinine, Ser 1.01 (*)    GFR calc non Af Amer 57 (*)    All other components within normal limits  GLUCOSE, CAPILLARY - Abnormal; Notable for the following components:   Glucose-Capillary 101 (*)    All other components within normal limits  SARS CORONAVIRUS 2 BY RT PCR (HOSPITAL ORDER, [place] [facility])  PROTIME-INR  APTT  CBC  DIFFERENTIAL  CBG MONITORING, ED  I-STAT CREATININE, ED  TROPONIN I (HIGH SENSITIVITY)   ____________________________________________  EKG  ED ECG REPORT I, Rudene Re, the attending physician, personally viewed and interpreted this ECG.  Sinus bradycardia, rate of 58, normal intervals, normal axis, normal EKG. ____________________________________________  RADIOLOGY  I have personally reviewed the images performed during this visit and I agree with the Radiologist's read.   Interpretation by Radiologist:   CT HEAD CODE STROKE WO CONTRAST  Result Date: 07/13/2020 CLINICAL DATA:  Code stroke.  69 year old female with weakness. EXAM: CT HEAD WITHOUT  CONTRAST TECHNIQUE: Contiguous axial images were obtained from the base of the skull through the vertex without intravenous contrast. COMPARISON:  Brain MRI 02/25/2015. Report of head CT 08/05/2003 (no images available). FINDINGS: Brain: Mildly decreased cerebral volume since 2016. No midline shift, mass effect, or evidence of intracranial mass lesion. No ventriculomegaly. No acute intracranial hemorrhage identified. Gray-white matter differentiation is within normal limits throughout the brain. No cortically based acute infarct identified. Vascular: Calcified atherosclerosis at the skull base. No suspicious intracranial vascular hyperdensity. Skull: Negative. Sinuses/Orbits: Visualized paranasal sinuses and mastoids are clear. Other: Visualized orbits and scalp soft tissues are within normal limits. ASPECTS [facility] Stroke Program Early CT Score) Total score (0-10 with 10 being normal): 10 IMPRESSION: Normal for age non contrast CT appearance of the brain.  ASPECTS 10. Study discussed by telephone with Dr. Duffy Bruce on 07/13/2020 at 15:54 . Electronically Signed   By: Genevie Ann M.D.   On: 07/13/2020 15:54     ____________________________________________   PROCEDURES  Procedure(s) performed: None Procedures Critical Care performed:  None ____________________________________________   INITIAL IMPRESSION / ASSESSMENT AND PLAN / ED COURSE   69 y.o. female with a history of hyperlipidemia, hypertension, cervical fusion, diabetes, neuropathy who presents for evaluation of facial and tongue numbness.   Patient described bilateral tongue and facial numbness which has been intermittent.  At this time symptoms have fully resolved.  Since symptoms are bilaterally low suspicion for stroke or TIA.  Patient is otherwise completely neurologically intact.  CT was  reviewed with no acute findings, confirmed by radiology.  Blood work with no abnormalities including no hypoglycemia, no electrolyte derangements, no significant dehydration, no anemia.  EKG showing no dysrhythmias or ischemia.  Old medical records reviewed.  Presentation concerning for neuropathy versus paresthesias.  With normal exam, negative CT, normal work-up, and resolution of the symptoms I feel the patient is safe for outpatient follow-up.  Discussed follow-up with PCP in 3 days.  Discussed my return precautions for any signs of stroke.  Old medical records reviewed.  Patient is happy to be discharged.      _____________________________________________ Please note:  Patient was evaluated in Emergency Department today for the symptoms described in the history of present illness. Patient was evaluated in the context of the global COVID-19 pandemic, which necessitated consideration that the patient might be at risk for infection with the SARS-CoV-2 virus that causes COVID-19. Institutional protocols and algorithms that pertain to the evaluation of patients at risk for COVID-19 are in a state of rapid change based on information released by regulatory bodies including the CDC and federal and state organizations. These policies and algorithms were followed during the patient's care in the ED.  Some ED evaluations and interventions may be delayed as a result of limited staffing during the pandemic.   [place] Controlled Substance Database was reviewed by me. ____________________________________________   FINAL CLINICAL IMPRESSION(S) / ED DIAGNOSES   Final diagnoses:  Facial numbness      NEW MEDICATIONS STARTED DURING THIS VISIT:  ED Discharge Orders    None       Note:  This document was prepared using Dragon voice recognition software and may include unintentional dictation errors.    Alfred Levins, [place], MD 07/14/20 [phone]

## 2020-08-01 ENCOUNTER — Inpatient Hospital Stay
Admission: EM | Admit: 2020-08-01 | Discharge: 2020-08-04 | DRG: 378 | Disposition: A | Discharge status: Home / Self Care | Payer: Medicare Other | Attending: Internal Medicine | Admitting: Internal Medicine

## 2020-08-01 ENCOUNTER — Emergency Department: Payer: Medicare Other

## 2020-08-01 ENCOUNTER — Ambulatory Visit: Payer: Self-pay | Admitting: Internal Medicine

## 2020-08-01 ENCOUNTER — Ambulatory Visit: Payer: Medicare Other | Admitting: Internal Medicine

## 2020-08-01 ENCOUNTER — Other Ambulatory Visit: Payer: Self-pay

## 2020-08-01 ENCOUNTER — Encounter: Payer: Self-pay | Admitting: Emergency Medicine

## 2020-08-01 DIAGNOSIS — I10 Essential (primary) hypertension: Secondary | ICD-10-CM | POA: Diagnosis present

## 2020-08-01 DIAGNOSIS — K253 Acute gastric ulcer without hemorrhage or perforation: Secondary | ICD-10-CM

## 2020-08-01 DIAGNOSIS — Z880 Allergy status to penicillin: Secondary | ICD-10-CM | POA: Diagnosis not present

## 2020-08-01 DIAGNOSIS — Z888 Allergy status to other drugs, medicaments and biological substances status: Secondary | ICD-10-CM | POA: Diagnosis not present

## 2020-08-01 DIAGNOSIS — F17290 Nicotine dependence, other tobacco product, uncomplicated: Secondary | ICD-10-CM | POA: Diagnosis present

## 2020-08-01 DIAGNOSIS — D649 Anemia, unspecified: Secondary | ICD-10-CM | POA: Diagnosis present

## 2020-08-01 DIAGNOSIS — K219 Gastro-esophageal reflux disease without esophagitis: Secondary | ICD-10-CM | POA: Diagnosis present

## 2020-08-01 DIAGNOSIS — Z9861 Coronary angioplasty status: Secondary | ICD-10-CM

## 2020-08-01 DIAGNOSIS — L409 Psoriasis, unspecified: Secondary | ICD-10-CM | POA: Diagnosis present

## 2020-08-01 DIAGNOSIS — E785 Hyperlipidemia, unspecified: Secondary | ICD-10-CM | POA: Diagnosis present

## 2020-08-01 DIAGNOSIS — E876 Hypokalemia: Secondary | ICD-10-CM | POA: Diagnosis not present

## 2020-08-01 DIAGNOSIS — K252 Acute gastric ulcer with both hemorrhage and perforation: Secondary | ICD-10-CM | POA: Diagnosis present

## 2020-08-01 DIAGNOSIS — E8881 Metabolic syndrome: Secondary | ICD-10-CM | POA: Diagnosis present

## 2020-08-01 DIAGNOSIS — E1165 Type 2 diabetes mellitus with hyperglycemia: Secondary | ICD-10-CM | POA: Diagnosis present

## 2020-08-01 DIAGNOSIS — Z85038 Personal history of other malignant neoplasm of large intestine: Secondary | ICD-10-CM

## 2020-08-01 DIAGNOSIS — K449 Diaphragmatic hernia without obstruction or gangrene: Secondary | ICD-10-CM | POA: Diagnosis present

## 2020-08-01 DIAGNOSIS — F5104 Psychophysiologic insomnia: Secondary | ICD-10-CM | POA: Diagnosis present

## 2020-08-01 DIAGNOSIS — Z881 Allergy status to other antibiotic agents status: Secondary | ICD-10-CM | POA: Diagnosis not present

## 2020-08-01 DIAGNOSIS — K255 Chronic or unspecified gastric ulcer with perforation: Secondary | ICD-10-CM | POA: Diagnosis not present

## 2020-08-01 DIAGNOSIS — R110 Nausea: Secondary | ICD-10-CM

## 2020-08-01 DIAGNOSIS — K862 Cyst of pancreas: Secondary | ICD-10-CM | POA: Diagnosis present

## 2020-08-01 DIAGNOSIS — M1990 Unspecified osteoarthritis, unspecified site: Secondary | ICD-10-CM | POA: Diagnosis present

## 2020-08-01 DIAGNOSIS — I2510 Atherosclerotic heart disease of native coronary artery without angina pectoris: Secondary | ICD-10-CM | POA: Diagnosis present

## 2020-08-01 DIAGNOSIS — F329 Major depressive disorder, single episode, unspecified: Secondary | ICD-10-CM

## 2020-08-01 DIAGNOSIS — E1142 Type 2 diabetes mellitus with diabetic polyneuropathy: Secondary | ICD-10-CM | POA: Diagnosis present

## 2020-08-01 DIAGNOSIS — Z885 Allergy status to narcotic agent status: Secondary | ICD-10-CM

## 2020-08-01 DIAGNOSIS — Z8249 Family history of ischemic heart disease and other diseases of the circulatory system: Secondary | ICD-10-CM

## 2020-08-01 DIAGNOSIS — Z9049 Acquired absence of other specified parts of digestive tract: Secondary | ICD-10-CM | POA: Diagnosis not present

## 2020-08-01 DIAGNOSIS — R1013 Epigastric pain: Secondary | ICD-10-CM | POA: Diagnosis present

## 2020-08-01 DIAGNOSIS — Z791 Long term (current) use of non-steroidal anti-inflammatories (NSAID): Secondary | ICD-10-CM

## 2020-08-01 DIAGNOSIS — Z79899 Other long term (current) drug therapy: Secondary | ICD-10-CM

## 2020-08-01 DIAGNOSIS — E1169 Type 2 diabetes mellitus with other specified complication: Secondary | ICD-10-CM | POA: Diagnosis not present

## 2020-08-01 DIAGNOSIS — Z7982 Long term (current) use of aspirin: Secondary | ICD-10-CM | POA: Diagnosis not present

## 2020-08-01 DIAGNOSIS — Z981 Arthrodesis status: Secondary | ICD-10-CM

## 2020-08-01 DIAGNOSIS — Z833 Family history of diabetes mellitus: Secondary | ICD-10-CM

## 2020-08-01 DIAGNOSIS — Z86010 Personal history of colonic polyps: Secondary | ICD-10-CM

## 2020-08-01 DIAGNOSIS — Z832 Family history of diseases of the blood and blood-forming organs and certain disorders involving the immune mechanism: Secondary | ICD-10-CM

## 2020-08-01 DIAGNOSIS — Z8719 Personal history of other diseases of the digestive system: Secondary | ICD-10-CM

## 2020-08-01 DIAGNOSIS — Z90710 Acquired absence of both cervix and uterus: Secondary | ICD-10-CM

## 2020-08-01 DIAGNOSIS — Z803 Family history of malignant neoplasm of breast: Secondary | ICD-10-CM

## 2020-08-01 HISTORY — DX: Chronic or unspecified gastric ulcer with perforation: K25.5

## 2020-08-01 LAB — CBC
HCT: 40.3 % (ref 36.0–46.0)
Hemoglobin: 13.7 g/dL (ref 12.0–15.0)
MCH: 31.6 pg (ref 26.0–34.0)
MCHC: 34 g/dL (ref 30.0–36.0)
MCV: 92.9 fL (ref 80.0–100.0)
Platelets: 287 10*3/uL (ref 150–400)
RBC: 4.34 MIL/uL (ref 3.87–5.11)
RDW: 12.8 % (ref 11.5–15.5)
WBC: 10.2 10*3/uL (ref 4.0–10.5)
nRBC: 0 % (ref 0.0–0.2)

## 2020-08-01 LAB — BASIC METABOLIC PANEL
Anion gap: 10 (ref 5–15)
BUN: 21 mg/dL (ref 8–23)
CO2: 28 mmol/L (ref 22–32)
Calcium: 9.8 mg/dL (ref 8.9–10.3)
Chloride: 99 mmol/L (ref 98–111)
Creatinine, Ser: 1.16 mg/dL — ABNORMAL HIGH (ref 0.44–1.00)
GFR calc Af Amer: 56 mL/min — ABNORMAL LOW (ref >60)
GFR calc non Af Amer: 48 mL/min — ABNORMAL LOW (ref >60)
Glucose, Bld: 117 mg/dL — ABNORMAL HIGH (ref 70–99)
Potassium: 3.7 mmol/L (ref 3.5–5.1)
Sodium: 137 mmol/L (ref 135–145)

## 2020-08-01 LAB — TROPONIN I (HIGH SENSITIVITY)
Troponin I (High Sensitivity): 6 ng/L (ref <18)
Troponin I (High Sensitivity): 6 ng/L (ref <18)

## 2020-08-01 LAB — LIPASE, BLOOD: Lipase: 28 U/L (ref 11–51)

## 2020-08-01 LAB — GLUCOSE, CAPILLARY: Glucose-Capillary: 106 mg/dL — ABNORMAL HIGH (ref 70–99)

## 2020-08-01 MED ORDER — ONDANSETRON HCL 4 MG/2ML IJ SOLN: 4.0000 mg | Freq: Four times a day (QID) | INTRAMUSCULAR | Status: DC | PRN

## 2020-08-01 MED ORDER — PANTOPRAZOLE SODIUM 40 MG PO TBEC: 40.0000 mg | DELAYED_RELEASE_TABLET | Freq: Every day | ORAL | Status: DC

## 2020-08-01 MED ORDER — ONDANSETRON 4 MG PO TBDP
4.0000 mg | ORAL_TABLET | Freq: Once | ORAL | Status: AC
  Administered 2020-08-01: 4 mg via ORAL

## 2020-08-01 MED ORDER — POTASSIUM CHLORIDE CRYS ER 20 MEQ PO TBCR
20.0000 meq | EXTENDED_RELEASE_TABLET | Freq: Every day | ORAL | Status: DC
  Administered 2020-08-02: 20 meq via ORAL
  Filled 2020-08-01 (×2): qty 1

## 2020-08-01 MED ORDER — SODIUM CHLORIDE 0.9 % IV SOLN: 80.0000 mg | Freq: Once | INTRAVENOUS | Status: DC

## 2020-08-01 MED ORDER — SODIUM CHLORIDE 0.9 % IV SOLN
80.0000 mg | Freq: Once | INTRAVENOUS | Status: AC
  Administered 2020-08-01: 80 mg via INTRAVENOUS
  Filled 2020-08-01 (×2): qty 80

## 2020-08-01 MED ORDER — ATORVASTATIN CALCIUM 20 MG PO TABS
40.0000 mg | ORAL_TABLET | Freq: Every day | ORAL | Status: DC
  Administered 2020-08-01 – 2020-08-03 (×3): 40 mg via ORAL
  Filled 2020-08-01 (×3): qty 2

## 2020-08-01 MED ORDER — GABAPENTIN 300 MG PO CAPS
600.0000 mg | ORAL_CAPSULE | Freq: Two times a day (BID) | ORAL | Status: DC
  Administered 2020-08-01: 300 mg via ORAL
  Administered 2020-08-02 – 2020-08-04 (×5): 600 mg via ORAL
  Filled 2020-08-01 (×6): qty 2

## 2020-08-01 MED ORDER — ONDANSETRON 4 MG PO TBDP
ORAL_TABLET | ORAL | Status: AC
  Filled 2020-08-01: qty 1

## 2020-08-01 MED ORDER — BACID PO TABS
2.0000 | ORAL_TABLET | Freq: Three times a day (TID) | ORAL | Status: DC
  Filled 2020-08-01 (×2): qty 2

## 2020-08-01 MED ORDER — SODIUM CHLORIDE 0.9 % IV SOLN: INTRAVENOUS | Status: DC

## 2020-08-01 MED ORDER — CHLORTHALIDONE 25 MG PO TABS
25.0000 mg | ORAL_TABLET | Freq: Every day | ORAL | Status: DC
  Administered 2020-08-02 – 2020-08-04 (×3): 25 mg via ORAL
  Filled 2020-08-01 (×3): qty 1

## 2020-08-01 MED ORDER — ACETAMINOPHEN 325 MG PO TABS: 650.0000 mg | ORAL_TABLET | Freq: Four times a day (QID) | ORAL | Status: DC | PRN

## 2020-08-01 MED ORDER — TRAZODONE HCL 50 MG PO TABS
50.0000 mg | ORAL_TABLET | Freq: Every day | ORAL | Status: DC
  Administered 2020-08-01 – 2020-08-03 (×3): 50 mg via ORAL
  Filled 2020-08-01 (×3): qty 1

## 2020-08-01 MED ORDER — CALCIUM CARBONATE-VITAMIN D 500-200 MG-UNIT PO TABS
1.0000 | ORAL_TABLET | Freq: Every day | ORAL | Status: DC
  Administered 2020-08-02 – 2020-08-04 (×3): 1 via ORAL
  Filled 2020-08-01 (×4): qty 1

## 2020-08-01 MED ORDER — DROPERIDOL 2.5 MG/ML IJ SOLN
2.5000 mg | Freq: Once | INTRAMUSCULAR | Status: AC
  Administered 2020-08-01: 2.5 mg via INTRAVENOUS
  Filled 2020-08-01: qty 2

## 2020-08-01 MED ORDER — ACETAMINOPHEN 650 MG RE SUPP: 650.0000 mg | Freq: Four times a day (QID) | RECTAL | Status: DC | PRN

## 2020-08-01 MED ORDER — PANTOPRAZOLE SODIUM 40 MG IV SOLR: 40.0000 mg | Freq: Two times a day (BID) | INTRAVENOUS | Status: DC

## 2020-08-01 MED ORDER — ALBUTEROL SULFATE (2.5 MG/3ML) 0.083% IN NEBU: 2.5000 mg | INHALATION_SOLUTION | RESPIRATORY_TRACT | Status: DC | PRN

## 2020-08-01 MED ORDER — ONDANSETRON HCL 4 MG PO TABS: 4.0000 mg | ORAL_TABLET | Freq: Four times a day (QID) | ORAL | Status: DC | PRN

## 2020-08-01 MED ORDER — RISAQUAD PO CAPS
2.0000 | ORAL_CAPSULE | Freq: Three times a day (TID) | ORAL | Status: DC
  Administered 2020-08-02 – 2020-08-04 (×7): 2 via ORAL
  Filled 2020-08-01 (×7): qty 2

## 2020-08-01 MED ORDER — SODIUM CHLORIDE 0.9 % IV SOLN: 8.0000 mg/h | INTRAVENOUS | Status: DC

## 2020-08-01 MED ORDER — INSULIN ASPART 100 UNIT/ML ~~LOC~~ SOLN: 0.0000 [IU] | Freq: Three times a day (TID) | SUBCUTANEOUS | Status: DC

## 2020-08-01 MED ORDER — AMLODIPINE BESYLATE 5 MG PO TABS
5.0000 mg | ORAL_TABLET | Freq: Every day | ORAL | Status: DC
  Administered 2020-08-01 – 2020-08-03 (×3): 5 mg via ORAL
  Filled 2020-08-01 (×3): qty 1

## 2020-08-01 MED ORDER — IOHEXOL 300 MG/ML  SOLN
100.0000 mL | Freq: Once | INTRAMUSCULAR | Status: AC | PRN
  Administered 2020-08-01: 100 mL via INTRAVENOUS
  Filled 2020-08-01: qty 100

## 2020-08-01 MED ORDER — METHOCARBAMOL 500 MG PO TABS
500.0000 mg | ORAL_TABLET | Freq: Four times a day (QID) | ORAL | Status: DC
  Administered 2020-08-01 – 2020-08-04 (×10): 500 mg via ORAL
  Filled 2020-08-01 (×10): qty 1

## 2020-08-01 MED ORDER — DULOXETINE HCL 60 MG PO CPEP
60.0000 mg | ORAL_CAPSULE | Freq: Every day | ORAL | Status: DC
  Administered 2020-08-02 – 2020-08-04 (×3): 60 mg via ORAL
  Filled 2020-08-01: qty 2
  Filled 2020-08-01 (×3): qty 1
  Filled 2020-08-01: qty 2

## 2020-08-01 MED ORDER — SODIUM CHLORIDE 0.9 % IV SOLN
8.0000 mg/h | INTRAVENOUS | Status: DC
  Administered 2020-08-02 – 2020-08-03 (×5): 8 mg/h via INTRAVENOUS
  Filled 2020-08-01 (×6): qty 80

## 2020-08-01 MED ORDER — FENTANYL CITRATE (PF) 100 MCG/2ML IJ SOLN: 12.5000 ug | INTRAMUSCULAR | Status: DC | PRN

## 2020-08-01 MED ORDER — MAGNESIUM HYDROXIDE 400 MG/5ML PO SUSP: 30.0000 mL | Freq: Every day | ORAL | Status: DC | PRN

## 2020-08-01 MED ORDER — METOPROLOL TARTRATE 50 MG PO TABS
50.0000 mg | ORAL_TABLET | Freq: Two times a day (BID) | ORAL | Status: DC
  Administered 2020-08-01 – 2020-08-04 (×6): 50 mg via ORAL
  Filled 2020-08-01 (×6): qty 1

## 2020-08-01 MED ORDER — LIDOCAINE VISCOUS HCL 2 % MT SOLN
15.0000 mL | Freq: Once | OROMUCOSAL | Status: AC
  Administered 2020-08-01: 15 mL via ORAL
  Filled 2020-08-01: qty 15

## 2020-08-01 MED ORDER — TRAZODONE HCL 50 MG PO TABS: 25.0000 mg | ORAL_TABLET | Freq: Every evening | ORAL | Status: DC | PRN

## 2020-08-01 MED ORDER — ALUM & MAG HYDROXIDE-SIMETH 200-200-20 MG/5ML PO SUSP
30.0000 mL | Freq: Once | ORAL | Status: AC
  Administered 2020-08-01: 30 mL via ORAL
  Filled 2020-08-01: qty 30

## 2020-08-01 MED ORDER — TAMSULOSIN HCL 0.4 MG PO CAPS
0.4000 mg | ORAL_CAPSULE | Freq: Every day | ORAL | Status: DC
  Administered 2020-08-02 – 2020-08-04 (×3): 0.4 mg via ORAL
  Filled 2020-08-01 (×4): qty 1

## 2020-08-01 MED ORDER — NITROGLYCERIN 0.4 MG SL SUBL: 0.4000 mg | SUBLINGUAL_TABLET | SUBLINGUAL | Status: DC | PRN

## 2020-08-01 NOTE — ED Triage Notes (Signed)
Pt in via POV, reports ongoing abdominal pain, chest pain, back pain x months, being treated for acid reflux.  Reports pain has worsened over the last week and has also noticed bright red blood in stool.  Vitals WDL, NAD noted at this time.

## 2020-08-01 NOTE — ED Provider Notes (Signed)
[facility] Emergency Department Provider Note ____________________________________________   First MD Initiated Contact with Patient 08/01/20 1740     (approximate)  I have reviewed the triage vital signs and the nursing notes.  HISTORY  Chief Complaint Abdominal Pain, Chest Pain, and Back Pain   HPI Betty Vaughn is a 68 y.o. femalewho presents to the ED for evaluation of abdominal and chest pain.  Chart review indicates history of HTN, HLD, depression, DM, GERD and colonic polyps.  Telephone encounter noted from earlier today with patient's PCP, urging her to go to the ED for evaluation of her 10/10 chest pain.  Patient presents to the ED with 2 months of epigastric abdominal pain.  She reports the pain is occasionally to her bilateral lower quadrants, but primarily throughout her epigastrium and upper quadrant regions.  She reports the pain is constant for 2 months, but then she also tells me that it comes and goes. She reports associated nausea throughout this timeframe.  She was reports an episode of hematochezia last week that was isolated to 1 day and she has since been passing "normal" and brown stool.  She denies any vomiting, coffee-ground emesis or hematemesis. She reports her PCP giving her a "acid pill" with transient improvement of her symptoms when it was started, but "it does not seem to be working anymore."  Patient denies any fevers.  Reports taking ASA 81 as only antithrombotic medication.  Past Medical History:  Diagnosis Date  . Allergy   . Arthritis   . Chronic insomnia   . Colon cancer ([place])    polyp was cancerous  . Depression   . Diabetes mellitus without complication ([place])   . GERD (gastroesophageal reflux disease)   . Hx of colonic polyps   . Hyperlipidemia   . Hypertension   . Neuromuscular disorder ([place])   . Psoriasis    scalp    Patient Active Problem List   Diagnosis Date Noted  . Perforated gastric ulcer ([place])  08/01/2020  . Myofascial muscle pain 01/22/2019  . Weakness of both arms 09/08/2018  . Renal insufficiency 09/08/2018  . 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 Cervical stenosis of spinal canal 01/14/2018  . Personal history of colonic polyps   . Rectal polyp   . Tobacco use disorder 11/20/2016  . Neuropathy 04/10/2016  . Glucose intolerance (impaired glucose tolerance) 05/13/2015  . Benign essential tremor 05/13/2015  . Chronic insomnia 05/13/2015  . 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 Multiple joint complaints 05/13/2015  . Depression, major, recurrent, in partial remission ([place]) 02/23/2014  . Acid reflux 02/23/2014  . Essential hypertension 02/23/2014  . Mixed hyperlipidemia 02/23/2014  . Chronic headache disorder 01/16/2012  . 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 CAD S/P percutaneous coronary angioplasty 03/16/2010    Past Surgical History:  Procedure Laterality Date  . ABDOMINAL HYSTERECTOMY     total  . CERVICAL FUSION  2005  . CERVICAL FUSION  01/2018   C2-T2 posterior laminectomy and fusion; removal of previous hardware  . CHOLECYSTECTOMY    . COLONOSCOPY WITH PROPOFOL N/A 06/12/2017   Procedure: COLONOSCOPY WITH PROPOFOL;  Surgeon: Lucilla Lame, MD;  Location: [place];  Service: Gastroenterology;  Laterality: N/A;  . 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"  . HAND SURGERY Bilateral 2001  . POLYPECTOMY  2000  . POLYPECTOMY N/A 06/12/2017   Procedure: POLYPECTOMY;  Surgeon: Lucilla Lame, MD;  Location: [place];  Service: Gastroenterology;  Laterality: N/A;  .  rigger finger      Prior to Admission medications   Medication Sig Start Date End Date Taking? Authorizing Provider  Accu-Chek FastClix Lancets MISC USE TO TEST BLOOD SUGAR TWICE DAILY 06/19/19   Glean Hess, MD  ACCU-CHEK GUIDE test strip USE TO TEST BLOOD SUGAR TWICE DAILY 06/19/19   Glean Hess, MD  amLODipine (NORVASC) 5 MG tablet TAKE 1 TABLET BY MOUTH  AT BEDTIME 04/26/20   Glean Hess, MD  Apoaequorin (PREVAGEN PO) Take by mouth.    [provider]  ASPIRIN LOW DOSE 81 MG EC tablet TAKE 1 TABLET BY MOUTH EVERY DAY 06/25/20   Glean Hess, MD  atorvastatin (LIPITOR) 40 MG tablet TAKE 1 TABLET BY MOUTH EVERY DAY 04/26/20   Glean Hess, MD  Blood Glucose Monitoring Suppl (ACCU-CHEK GUIDE ME) w/Device KIT TO TEST TWICE DAILY 06/19/19   Glean Hess, MD  Calcium Carbonate-Vitamin D 600-400 MG-UNIT tablet TAKE 1 TABLET BY MOUTH EVERY DAY 09/25/19   Glean Hess, MD  chlorthalidone (HYGROTON) 25 MG tablet TAKE 1 TABLET BY MOUTH EVERY DAY 04/26/20   Glean Hess, MD  DULoxetine (CYMBALTA) 60 MG capsule TAKE 1 CAPSULE(60 MG) BY MOUTH DAILY 03/27/20   Glean Hess, MD  gabapentin (NEURONTIN) 300 MG capsule TAKE 2 CAPSULES(600 MG) BY MOUTH TWICE DAILY 06/25/20   Glean Hess, MD  lactobacillus acidophilus (BACID) TABS tablet Take 2 tablets by mouth 3 (three) times daily.    [provider]  meloxicam (MOBIC) 15 MG tablet TAKE 1 TABLET(15 MG) BY MOUTH DAILY 05/26/20   Glean Hess, MD  methocarbamol (ROBAXIN) 500 MG tablet TAKE 1 TABLET BY MOUTH FOUR TIMES DAILY 02/26/20   Glean Hess, MD  metoprolol tartrate (LOPRESSOR) 50 MG tablet TAKE 1 TABLET BY MOUTH TWICE DAILY 10/25/19   Glean Hess, MD  Multiple Minerals-Vitamins (CALCIUM & VIT D3 BONE HEALTH PO) Take by mouth. 600-400 mg    [provider]  nitroGLYCERIN (NITROSTAT) 0.4 MG SL tablet Place under the tongue.    [provider]  omeprazole (PRILOSEC) 20 MG capsule TAKE 1 CAPSULE(20 MG) BY MOUTH TWICE DAILY BEFORE A MEAL 05/26/20   Glean Hess, MD  potassium chloride SA (KLOR-CON) 20 MEQ tablet TAKE 1 TABLET BY MOUTH EVERY DAY 04/26/20   Glean Hess, MD  tamsulosin (FLOMAX) 0.4 MG CAPS capsule TAKE 1 CAPSULE BY MOUTH EVERY DAY 07/28/19   Glean Hess, MD  traZODone (DESYREL) 50 MG tablet Take 1 tablet (50 mg  total) by mouth at bedtime. 03/09/20   Glean Hess, MD  VENTOLIN HFA 108 [phone] Base) MCG/ACT inhaler INHALE 2 PUFFS INTO THE LUNGS EVERY 6 HOURS AS NEEDED FOR WHEEZING OR SHORTNESS OF BREATH 04/26/19   Glean Hess, MD    Allergies Azithromycin, Propoxyphene, Tramadol, Penicillins, Piroxicam, and Sertraline hcl  Family History  Problem Relation Age of Onset  . Breast cancer Mother 45  . Diabetes Mother   . CAD Father   . Heart attack Father   . Diabetes Daughter   . Kidney failure Daughter   . Breast cancer Sister 23  . Sickle cell anemia Other   . Cancer Brother        lung  . Heart attack Sister   . Cancer Sister        ovarian  . Kidney cancer Neg Hx   . Bladder Cancer Neg Hx     Social History Social  History   Tobacco Use  . Smoking status: Former Smoker    Packs/day: 0.25    Years: 30.00    Pack years: 7.50    Types: Cigarettes    Quit date: 03/25/2020    Years since quitting: 0.3  . Smokeless tobacco: Never Used  Vaping Use  . Vaping Use: Every day  Substance Use Topics  . Alcohol use: Yes    Alcohol/week: 1.0 standard drink    Types: 1 Glasses of wine per week    Comment: occ  . Drug use: No    Review of Systems  Constitutional: No fever/chills Eyes: No visual changes. ENT: No sore throat. Cardiovascular: Denies chest pain. Respiratory: Denies shortness of breath. Gastrointestinal: Positive for abdominal pain and nausea.  No vomiting.  No diarrhea.  No constipation.  Positive for hematochezia Genitourinary: Negative for dysuria. Musculoskeletal: Negative for back pain. Skin: Negative for rash. Neurological: Negative for headaches, focal weakness or numbness.   ____________________________________________   PHYSICAL EXAM:  VITAL SIGNS: Vitals:   08/01/20 1337 08/01/20 1622  BP: (!) 142/90 132/71  Pulse: 76 73  Resp: 20 20  Temp: 98.6 F (37 C) 98.8 F (37.1 C)  SpO2: 99% 98%      Constitutional: Alert and oriented. Well  appearing and in no acute distress. Eyes: Conjunctivae are normal. PERRL. EOMI. Head: Atraumatic. Nose: No congestion/rhinnorhea. Mouth/Throat: Mucous membranes are moist.  Oropharynx non-erythematous. Neck: No stridor. No cervical spine tenderness to palpation. Cardiovascular: Normal rate, regular rhythm. Grossly normal heart sounds.  Good peripheral circulation. Respiratory: Normal respiratory effort.  No retractions. Lungs CTAB. Gastrointestinal: Soft , nondistended. No abdominal bruits. No CVA tenderness. Epigastric tenderness to palpation with involuntary guarding. Musculoskeletal: No lower extremity tenderness nor edema.  No joint effusions. No signs of acute trauma. Neurologic:  Normal speech and language. No gross focal neurologic deficits are appreciated. No gait instability noted. Skin:  Skin is warm, dry and intact. No rash noted. Psychiatric: Mood and affect are normal. Speech and behavior are normal.  ____________________________________________   LABS (all labs ordered are listed, but only abnormal results are displayed)  Labs Reviewed  BASIC METABOLIC PANEL - Abnormal; Notable for the following components:      Result Value   Glucose, Bld 117 (*)    Creatinine, Ser 1.16 (*)    GFR calc non Af Amer 48 (*)    GFR calc Af Amer 56 (*)    All other components within normal limits  CBC  LIPASE, BLOOD  HIV ANTIBODY (ROUTINE TESTING W REFLEX)  BASIC METABOLIC PANEL  CBC  TROPONIN I (HIGH SENSITIVITY)  TROPONIN I (HIGH SENSITIVITY)   ____________________________________________  12 Lead EKG Him, rate of 74 bpm, normal axis and intervals.  No evidence of acute ischemia.  ____________________________________________  RADIOLOGY  ED MD interpretation: 2 view CXR without acute cardiopulmonary pathology  Official radiology report(s): DG Chest 2 View  Result Date: 08/01/2020 CLINICAL DATA:  Chest pain EXAM: CHEST - 2 VIEW COMPARISON:  September 03, 2019 FINDINGS: Lungs  are clear. Heart size and pulmonary vascularity are normal. No adenopathy. No pneumothorax. There is postoperative change in the lower cervical and upper thoracic regions. IMPRESSION: Lungs clear.  Cardiac silhouette within normal limits. Electronically Signed   By: Lowella Grip III M.D.   On: 08/01/2020 14:33   CT ABDOMEN PELVIS W CONTRAST  Result Date: 08/01/2020 CLINICAL DATA:  Abdominal pain, acute, nonlocalized diffuse chronic pain. difficult exam due to voluntary guarding. Hx hysterectomy and cholecystectomy  EXAM: CT ABDOMEN AND PELVIS WITH CONTRAST TECHNIQUE: Multidetector CT imaging of the abdomen and pelvis was performed using the standard protocol following bolus administration of intravenous contrast. CONTRAST:  130m OMNIPAQUE IOHEXOL 300 MG/ML  SOLN COMPARISON:  Abdominal CT 07/06/2013 FINDINGS: Lower chest: No pleural effusion or focal airspace disease. Small hiatal hernia with wall thickening of the distal esophagus. Hepatobiliary: Mild diffuse hepatic steatosis. No focal hepatic lesion. Mild focal fatty infiltration adjacent to the falciform ligament. Cholecystectomy with prominent common bile duct measuring 9 mm, within normal limits for post cholecystectomy status. Previous intrahepatic biliary ductal dilatation has resolved. Pancreas: No ductal dilatation or inflammation. Tiny hypodensity in the proximal pancreatic body, series 2, image 25 measures approximately 6 x 4 mm. Spleen: Normal in size without focal abnormality. Adrenals/Urinary Tract: No adrenal nodule. Slight left adrenal thickening. No hydronephrosis or perinephric edema. Homogeneous renal enhancement with symmetric excretion on delayed phase imaging. Slightly lobulated bilateral renal contours. There are tiny bilateral renal cortical hypodensities that are too small to characterize. Urinary bladder is partially distended with equivocal wall thickening. Stomach/Bowel: Small hiatal hernia with mild wall thickening of the distal  esophagus. There is abnormal peri pyloric wall thickening about the stomach with adjacent fat stranding. Fluid density outpouching with thinning of the gastric wall inferiorly in the region of wall thickening is suspicious for gastric ulcer, series 6, image 44 and series 2, image 25. There is no adjacent free air or focal fluid collection. No small bowel obstruction, evident wall thickening or inflammation. Normal appendix. Cecum is slightly high-riding in the right mid abdomen. Colonic diverticulosis, prominent in the descending and sigmoid colon, occasional in the right colon. No acute diverticulitis. Vascular/Lymphatic: Normal caliber abdominal aorta. Portal vein is patent. No portal venous or mesenteric gas. Small perigastric lymph node in the region of inflammation measuring 7 mm, series 5, image 27, likely reactive but nonspecific. Reproductive: Status post hysterectomy. No adnexal masses. Other: Fat stranding in the upper abdomen adjacent gastric wall thickening. No significant ascites or free fluid. There is no free air or focal fluid collection. No abdominal wall hernia. Musculoskeletal: There are no acute or suspicious osseous abnormalities. IMPRESSION: 1. Abnormal peri pyloric wall thickening about the stomach with adjacent fat stranding. Fluid density outpouching with thinning of the gastric wall inferiorly in the region of wall thickening is suspicious for gastric ulcer. A degree of surrounding fat stranding raises concern for impending or contained perforation, however no adjacent free air, ascites, or focal fluid collection. Recommend correlation with endoscopy to exclude possibility of underlying neoplasm. 2. Small hiatal hernia with mild wall thickening of the distal esophagus, can be seen with reflux or esophagitis. 3. Colonic diverticulosis without diverticulitis. 4. Hepatic steatosis. 5. Tiny hypodensity in the proximal pancreatic body measures 6 x 4 mm, likely benign, and in general stable from  2014 exam. Recommend follow up pre and post contrast MRI/MRCP or pancreatic protocol CT in 2 years. This recommendation follows ACR consensus guidelines: Management of Incidental Pancreatic Cysts: A White Paper of the ACR Incidental Findings Committee. J Am Coll Radiol 26962;95:284-132 6. Equivocal bladder wall thickening. Aortic Atherosclerosis (ICD10-I70.0). Electronically Signed   By: MKeith RakeM.D.   On: 08/01/2020 19:49    ____________________________________________   PROCEDURES and INTERVENTIONS  Procedure(s) performed (including Critical Care):  Procedures  Medications  ondansetron (ZOFRAN-ODT) 4 MG disintegrating tablet (  Not Given 08/01/20 1536)  pantoprazole (PROTONIX) 80 mg in sodium chloride 0.9 % 100 mL IVPB (has no administration in time range)  pantoprazole (PROTONIX) 80 mg in sodium chloride 0.9 % 100 mL (0.8 mg/mL) infusion (has no administration in time range)  pantoprazole (PROTONIX) injection 40 mg (has no administration in time range)  amLODipine (NORVASC) tablet 5 mg (has no administration in time range)  atorvastatin (LIPITOR) tablet 40 mg (has no administration in time range)  chlorthalidone (HYGROTON) tablet 25 mg (has no administration in time range)  metoprolol tartrate (LOPRESSOR) tablet 50 mg (has no administration in time range)  nitroGLYCERIN (NITROSTAT) SL tablet 0.4 mg (has no administration in time range)  DULoxetine (CYMBALTA) DR capsule 60 mg (has no administration in time range)  traZODone (DESYREL) tablet 50 mg (has no administration in time range)  lactobacillus acidophilus (BACID) tablet 2 tablet (has no administration in time range)  pantoprazole (PROTONIX) EC tablet 40 mg (has no administration in time range)  tamsulosin (FLOMAX) capsule 0.4 mg (has no administration in time range)  gabapentin (NEURONTIN) capsule 600 mg (has no administration in time range)  Calcium Carbonate-Vitamin D 600-400 MG-UNIT 1 tablet (has no administration in time  range)  methocarbamol (ROBAXIN) tablet 500 mg (has no administration in time range)  potassium chloride SA (KLOR-CON) CR tablet 20 mEq (has no administration in time range)  albuterol (VENTOLIN HFA) 108 (90 Base) MCG/ACT inhaler 2 puff (has no administration in time range)  0.9 %  sodium chloride infusion (has no administration in time range)  acetaminophen (TYLENOL) tablet 650 mg (has no administration in time range)    Or  acetaminophen (TYLENOL) suppository 650 mg (has no administration in time range)  traZODone (DESYREL) tablet 25 mg (has no administration in time range)  magnesium hydroxide (MILK OF MAGNESIA) suspension 30 mL (has no administration in time range)  ondansetron (ZOFRAN) tablet 4 mg (has no administration in time range)    Or  ondansetron (ZOFRAN) injection 4 mg (has no administration in time range)  fentaNYL (SUBLIMAZE) injection 12.5-25 mcg (has no administration in time range)  ondansetron (ZOFRAN-ODT) disintegrating tablet 4 mg (4 mg Oral Given 08/01/20 1359)  droperidol (INAPSINE) 2.5 MG/ML injection 2.5 mg (2.5 mg Intravenous Given 08/01/20 2020)  alum & mag hydroxide-simeth (MAALOX/MYLANTA) 200-200-20 MG/5ML suspension 30 mL (30 mLs Oral Given 08/01/20 1855)    And  lidocaine (XYLOCAINE) 2 % viscous mouth solution 15 mL (15 mLs Oral Given 08/01/20 1855)  iohexol (OMNIPAQUE) 300 MG/ML solution 100 mL (100 mLs Intravenous Contrast Given 08/01/20 1929)    ____________________________________________   MDM / ED COURSE  69 year old woman with a history of GERD presenting with acute on chronic epigastric pain concerning for perforation of gastric ulcer and requiring medical admission.  Normal vital signs on room air.  Exam with concerning tenderness to her epigastrium with involuntary guarding, otherwise without acute derangements.  She is overall well-appearing when not palpating her abdomen.  Blood work is unremarkable.  CXR without infiltrates.  Nonischemic EKG.  Patient  reports resolution of symptoms after a GI cocktail.  CT imaging, as documented above, with rather large collection of fluid and stranding around her pylorus concerning for contained perforation.  Due to the degree of tenderness on my examination, consulted general surgery who evaluated the patient and does not recommend emergent surgical intervention at this time.  We will consult GI and admit the patient to hospitalist medicine for malignancy work-up and serial abdominal exams with plans for surgical intervention with any clinical deterioration.  Clinical Course as of Aug 01 2099  Tue Aug 01, 2020  1925 Reassessed.  Patient reports  resolution of symptoms prior to GI cocktail administration.  She reports "I just needed to relax and rest."  CT technician coming out to get the patient, reexamination of her abdomen is improved, but I think is reasonable to continue with CT imaging in the setting of her advanced age and tenderness upon my initial examination.  Droperidol held because of lack of pain at this time.   [DS]  2003 Reassessed.  Patient reports re- worsening of her pain.  Reexamination of her abdomen reveals concerning examination with voluntary guarding to her upper quadrants, similar to my initial examination.   [DS]  2007 Called Dr. Lysle Pearl, general surgery on-call, who is reviewing CT images and will come to the ED to evaluate the patient.   [DS]  2027 Dr. Lysle Pearl at the bedside   [DS]  2035 Dr. Lysle Pearl recommending medical admission for malignancy work-up and serial abdominal exams.  Will likely require GI EGD during this admission.  No urgent need for surgery at this time.  He will follow along.   [DS]  2042 Spoke with admitting hospitalist agrees to admit the patient.   [DS]    Clinical Course User Index [DS] Vladimir Crofts, MD     ____________________________________________   FINAL CLINICAL IMPRESSION(S) / ED DIAGNOSES  Final diagnoses:  Epigastric pain  Nausea without vomiting   Acute pylorus ulcer  Gastric ulcer with perforation, unspecified chronicity [facility])     ED Discharge Orders    None       Dajon Rowe   Note:  This document was prepared using Dragon voice recognition software and may include unintentional dictation errors.   Vladimir Crofts, MD 08/01/20 2101

## 2020-08-01 NOTE — Telephone Encounter (Signed)
Pt reports 10/10 abdominal pain. States onset 1 month ago, worsening x 1 week. "Today the worst." Reports at lower abdomen, radiates around to both sides of back and "Shoots up to my chest." States intermittent "But comes like labor pains, hard to breathe." Denies dysuria. Reports vomited "Bile" this AM, stools loose. Reports blood in stools last week, bright red, resolved. Reports chills, subjective fever. States BP last night 143/114. Pt sounds distressed.  Advised ED, declined. States "They didn't do anything about it last time." Pt seen in ED 07/14/20 'FAcial numbness.'  States "Mentioned stomach pain and they sent me home." Reiterated need for ED eval, declines. Agent had made appt for 4pm today prior to triage. Explained ned for ED eval, declines.  Called practice, [facility], to alert pt declines ED, spoke with Dr. Oneal Deputy nurse, advised ED. Pt aware, states will follow disposition, has driver.  Reason for Disposition  [1] SEVERE pain AND [2] age > 60 years  Answer Assessment - Initial Assessment Questions 1. LOCATION: "Where does it hurt?"       Bottom of stomach, but entire stomach 2. RADIATION: "Does the pain shoot anywhere else?" (e.g., chest, back)     Back and shoots up to chest 3. ONSET: "When did the pain begin?" (e.g., minutes, hours or days ago)      1 week 4. SUDDEN: "Gradual or sudden onset?"     Gradual 5. PATTERN "Does the pain come and go, or is it constant?"    - If constant: "Is it getting better, staying the same, or worsening?"      (Note: Constant means the pain never goes away completely; most serious pain is constant and it progresses)     - If intermittent: "How long does it last?" "Do you have pain now?"     (Note: Intermittent means the pain goes away completely between bouts)     Intermittent, lasts a few minutes 6. SEVERITY: "How bad is the pain?"  (e.g., Scale 1-10; mild, moderate, or severe)   - MILD (1-3): doesn't interfere with normal activities, abdomen  soft and not tender to touch    - MODERATE (4-7): interferes with normal activities or awakens from sleep, tender to touch    - SEVERE (8-10): excruciating pain, doubled over, unable to do any normal activities      10/10 7. RECURRENT SYMPTOM: "Have you ever had this type of stomach pain before?" If Yes, ask: "When was the last time?" and "What happened that time?"    "Not like this." 8. CAUSE: "What do you think is causing the stomach pain?"    Unsure 9. RELIEVING/AGGRAVATING FACTORS: "What makes it better or worse?" (e.g., movement, antacids, bowel movement)     10. OTHER SYMPTOMS: "Has there been any vomiting, diarrhea, constipation, or urine problems?"      Vomiting, bile. Last week blood in stools, bright red.  Protocols used: ABDOMINAL PAIN [facility]

## 2020-08-01 NOTE — ED Notes (Signed)
Attempted IV with no success. 

## 2020-08-01 NOTE — H&P (Signed)
Betty Vaughn   PATIENT NAME: Betty Vaughn    MR#:  [id number]  DATE OF BIRTH:  03-Apr-1951  DATE OF ADMISSION:  08/01/2020  PRIMARY CARE PHYSICIAN: Glean Hess, MD   REQUESTING/REFERRING PHYSICIAN: Vladimir Crofts, MD CHIEF COMPLAINT:   Chief Complaint  Patient presents with  . Abdominal Pain  . Chest Pain  . Back Pain    HISTORY OF PRESENT ILLNESS:  Betty Vaughn  is a 69 y.o. African-American female with a known history of type 2 diabetes mellitus, GERD, hypertension, dyslipidemia and psoriasis, who presented to the emergency room with acute onset of abdominal pain that started last week and has been intermittent and got steady since this morning.  It started in the left lower quadrant and and is currently mainly in the epigastric area.  She admitted to chills but denied any fever.  She had bright red blood in her stools last week.  She admitted to melena with her bowel movements.  No diarrhea or constipation.  No chest pain or dyspnea or cough or wheezing.  No dysuria, oliguria or hematuria or flank pain.  She has been vaccinated against COVID-19.   Upon presentation to the emergency room, blood pressure was 142/90 with otherwise normal vital signs.  Labs revealed unremarkable CMP and high-sensitivity troponin I was 6 and later 6.  CBC was within normal. EKG showed normal sinus rhythm with a rate of 74.  Chest x-ray showed clear lungs with no acute findings.  Abdominal pelvic CT scan revealed: 1. Abnormal peri pyloric wall thickening about the stomach with adjacent fat stranding. Fluid density outpouching with thinning of the gastric wall inferiorly in the region of wall thickening is suspicious for gastric ulcer. A degree of surrounding fat stranding raises concern for impending or contained perforation, however no adjacent free air, ascites, or focal fluid collection. Recommend correlation with endoscopy to exclude possibility of underlying neoplasm. 2. Small  hiatal hernia with mild wall thickening of the distal esophagus, can be seen with reflux or esophagitis. 3. Colonic diverticulosis without diverticulitis. 4. Hepatic steatosis. 5. Tiny hypodensity in the proximal pancreatic body measures 6 x 4 mm, likely benign, and in general stable from 2014 exam.  The patient was given 2.5 mg IV and 4 mg of IV Zofran as well as GI cocktail.  The patient will be admitted to a medically monitored bed for further evaluation and management. PAST MEDICAL HISTORY:   Past Medical History:  Diagnosis Date  . Allergy   . Arthritis   . Chronic insomnia   . Colon cancer ([place])    polyp was cancerous  . Depression   . Diabetes mellitus without complication ([place])   . GERD (gastroesophageal reflux disease)   . Hx of colonic polyps   . Hyperlipidemia   . Hypertension   . Neuromuscular disorder ([place])   . Psoriasis    scalp    PAST SURGICAL HISTORY:   Past Surgical History:  Procedure Laterality Date  . ABDOMINAL HYSTERECTOMY     total  . CERVICAL FUSION  2005  . CERVICAL FUSION  01/2018   C2-T2 posterior laminectomy and fusion; removal of previous hardware  . CHOLECYSTECTOMY    . COLONOSCOPY WITH PROPOFOL N/A 06/12/2017   Procedure: COLONOSCOPY WITH PROPOFOL;  Surgeon: Lucilla Lame, MD;  Location: [facility];  Service: Gastroenterology;  Laterality: N/A;  . 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"  . HAND SURGERY Bilateral 2001  . POLYPECTOMY  2000  .  POLYPECTOMY N/A 06/12/2017   Procedure: POLYPECTOMY;  Surgeon: Lucilla Lame, MD;  Location: [facility];  Service: Gastroenterology;  Laterality: N/A;  . rigger finger      SOCIAL HISTORY:   Social History   Tobacco Use  . Smoking status: Former Smoker    Packs/day: 0.25    Years: 30.00    Pack years: 7.50    Types: Cigarettes    Quit date: 03/25/2020    Years since quitting: 0.3  . Smokeless tobacco: Never Used  Substance Use Topics  . Alcohol use: Yes     Alcohol/week: 1.0 standard drink    Types: 1 Glasses of wine per week    Comment: occ    FAMILY HISTORY:   Family History  Problem Relation Age of Onset  . Breast cancer Mother 17  . Diabetes Mother   . CAD Father   . Heart attack Father   . Diabetes Daughter   . Kidney failure Daughter   . Breast cancer Sister 20  . Sickle cell anemia Other   . Cancer Brother        lung  . Heart attack Sister   . Cancer Sister        ovarian  . Kidney cancer Neg Hx   . Bladder Cancer Neg Hx     DRUG ALLERGIES:   Allergies  Allergen Reactions  . Azithromycin Swelling  . Propoxyphene Anaphylaxis  . Tramadol Anaphylaxis  . Penicillins Swelling  . Piroxicam Rash  . Sertraline Hcl Rash    REVIEW OF SYSTEMS:   ROS As per history of present illness. All pertinent systems were reviewed above. Constitutional, HEENT, cardiovascular, respiratory, GI, GU, musculoskeletal, neuro, psychiatric, endocrine, integumentary and hematologic systems were reviewed and are otherwise negative/unremarkable except for positive findings mentioned above in the HPI.   MEDICATIONS AT HOME:   Prior to Admission medications   Medication Sig Start Date End Date Taking? Authorizing Provider  Accu-Chek FastClix Lancets MISC USE TO TEST BLOOD SUGAR TWICE DAILY 06/19/19   Glean Hess, MD  ACCU-CHEK GUIDE test strip USE TO TEST BLOOD SUGAR TWICE DAILY 06/19/19   Glean Hess, MD  amLODipine (NORVASC) 5 MG tablet TAKE 1 TABLET BY MOUTH AT BEDTIME 04/26/20   Glean Hess, MD  Apoaequorin (PREVAGEN PO) Take by mouth.    [provider]  ASPIRIN LOW DOSE 81 MG EC tablet TAKE 1 TABLET BY MOUTH EVERY DAY 06/25/20   Glean Hess, MD  atorvastatin (LIPITOR) 40 MG tablet TAKE 1 TABLET BY MOUTH EVERY DAY 04/26/20   Glean Hess, MD  Blood Glucose Monitoring Suppl (ACCU-CHEK GUIDE ME) w/Device KIT TO TEST TWICE DAILY 06/19/19   Glean Hess, MD  Calcium Carbonate-Vitamin D 600-400 MG-UNIT tablet  TAKE 1 TABLET BY MOUTH EVERY DAY 09/25/19   Glean Hess, MD  chlorthalidone (HYGROTON) 25 MG tablet TAKE 1 TABLET BY MOUTH EVERY DAY 04/26/20   Glean Hess, MD  DULoxetine (CYMBALTA) 60 MG capsule TAKE 1 CAPSULE(60 MG) BY MOUTH DAILY 03/27/20   Glean Hess, MD  gabapentin (NEURONTIN) 300 MG capsule TAKE 2 CAPSULES(600 MG) BY MOUTH TWICE DAILY 06/25/20   Glean Hess, MD  lactobacillus acidophilus (BACID) TABS tablet Take 2 tablets by mouth 3 (three) times daily.    [provider]  meloxicam (MOBIC) 15 MG tablet TAKE 1 TABLET(15 MG) BY MOUTH DAILY 05/26/20   Glean Hess, MD  methocarbamol (ROBAXIN) 500 MG tablet TAKE 1 TABLET  BY MOUTH FOUR TIMES DAILY 02/26/20   Glean Hess, MD  metoprolol tartrate (LOPRESSOR) 50 MG tablet TAKE 1 TABLET BY MOUTH TWICE DAILY 10/25/19   Glean Hess, MD  Multiple Minerals-Vitamins (CALCIUM & VIT D3 BONE HEALTH PO) Take by mouth. 600-400 mg    [provider]  nitroGLYCERIN (NITROSTAT) 0.4 MG SL tablet Place under the tongue.    [provider]  omeprazole (PRILOSEC) 20 MG capsule TAKE 1 CAPSULE(20 MG) BY MOUTH TWICE DAILY BEFORE A MEAL 05/26/20   Glean Hess, MD  potassium chloride SA (KLOR-CON) 20 MEQ tablet TAKE 1 TABLET BY MOUTH EVERY DAY 04/26/20   Glean Hess, MD  tamsulosin (FLOMAX) 0.4 MG CAPS capsule TAKE 1 CAPSULE BY MOUTH EVERY DAY 07/28/19   Glean Hess, MD  traZODone (DESYREL) 50 MG tablet Take 1 tablet (50 mg total) by mouth at bedtime. 03/09/20   Glean Hess, MD  VENTOLIN HFA 108 [phone] Base) MCG/ACT inhaler INHALE 2 PUFFS INTO THE LUNGS EVERY 6 HOURS AS NEEDED FOR WHEEZING OR SHORTNESS OF BREATH 04/26/19   Glean Hess, MD      VITAL SIGNS:  Blood pressure 132/71, pulse 73, temperature 98.8 F (37.1 C), temperature source Oral, resp. rate 20, SpO2 98 %.  PHYSICAL EXAMINATION:  Physical Exam  GENERAL:  69 y.o.-year-old African-American female patient lying in the bed with  no acute distress.  EYES: Pupils equal, round, reactive to light and accommodation. No scleral icterus. Extraocular muscles intact.  HEENT: Head atraumatic, normocephalic. Oropharynx and nasopharynx clear.  NECK:  Supple, no jugular venous distention. No thyroid enlargement, no tenderness.  LUNGS: Normal breath sounds bilaterally, no wheezing, rales,rhonchi or crepitation. No use of accessory muscles of respiration.  CARDIOVASCULAR: Regular rate and rhythm, S1, S2 normal. No murmurs, rubs, or gallops.  ABDOMEN: Soft, nondistended with mild epigastric tenderness without rebound tenderness guarding or rigidity.  Bowel sounds present. No organomegaly or mass.  EXTREMITIES: No pedal edema, cyanosis, or clubbing.  NEUROLOGIC: Cranial nerves II through XII are intact. Muscle strength 5/5 in all extremities. Sensation intact. Gait not checked.  PSYCHIATRIC: The patient is alert and oriented x 3.  Normal affect and good eye contact. SKIN: No obvious rash, lesion, or ulcer.   LABORATORY PANEL:   CBC Recent Labs  Lab 08/01/20 1357  WBC 10.2  HGB 13.7  HCT 40.3  PLT 287   ------------------------------------------------------------------------------------------------------------------  Chemistries  Recent Labs  Lab 08/01/20 1357  NA 137  K 3.7  CL 99  CO2 28  GLUCOSE 117*  BUN 21  CREATININE 1.16*  CALCIUM 9.8   ------------------------------------------------------------------------------------------------------------------  Cardiac Enzymes No results for input(s): TROPONINI in the last 168 hours. ------------------------------------------------------------------------------------------------------------------  RADIOLOGY:  DG Chest 2 View  Result Date: 08/01/2020 CLINICAL DATA:  Chest pain EXAM: CHEST - 2 VIEW COMPARISON:  September 03, 2019 FINDINGS: Lungs are clear. Heart size and pulmonary vascularity are normal. No adenopathy. No pneumothorax. There is postoperative change in the  lower cervical and upper thoracic regions. IMPRESSION: Lungs clear.  Cardiac silhouette within normal limits. Electronically Signed   By: Lowella Grip III M.D.   On: 08/01/2020 14:33   CT ABDOMEN PELVIS W CONTRAST  Result Date: 08/01/2020 CLINICAL DATA:  Abdominal pain, acute, nonlocalized diffuse chronic pain. difficult exam due to voluntary guarding. Hx hysterectomy and cholecystectomy EXAM: CT ABDOMEN AND PELVIS WITH CONTRAST TECHNIQUE: Multidetector CT imaging of the abdomen and pelvis was performed using the standard protocol following bolus administration of  intravenous contrast. CONTRAST:  1108m OMNIPAQUE IOHEXOL 300 MG/ML  SOLN COMPARISON:  Abdominal CT 07/06/2013 FINDINGS: Lower chest: No pleural effusion or focal airspace disease. Small hiatal hernia with wall thickening of the distal esophagus. Hepatobiliary: Mild diffuse hepatic steatosis. No focal hepatic lesion. Mild focal fatty infiltration adjacent to the falciform ligament. Cholecystectomy with prominent common bile duct measuring 9 mm, within normal limits for post cholecystectomy status. Previous intrahepatic biliary ductal dilatation has resolved. Pancreas: No ductal dilatation or inflammation. Tiny hypodensity in the proximal pancreatic body, series 2, image 25 measures approximately 6 x 4 mm. Spleen: Normal in size without focal abnormality. Adrenals/Urinary Tract: No adrenal nodule. Slight left adrenal thickening. No hydronephrosis or perinephric edema. Homogeneous renal enhancement with symmetric excretion on delayed phase imaging. Slightly lobulated bilateral renal contours. There are tiny bilateral renal cortical hypodensities that are too small to characterize. Urinary bladder is partially distended with equivocal wall thickening. Stomach/Bowel: Small hiatal hernia with mild wall thickening of the distal esophagus. There is abnormal peri pyloric wall thickening about the stomach with adjacent fat stranding. Fluid density  outpouching with thinning of the gastric wall inferiorly in the region of wall thickening is suspicious for gastric ulcer, series 6, image 44 and series 2, image 25. There is no adjacent free air or focal fluid collection. No small bowel obstruction, evident wall thickening or inflammation. Normal appendix. Cecum is slightly high-riding in the right mid abdomen. Colonic diverticulosis, prominent in the descending and sigmoid colon, occasional in the right colon. No acute diverticulitis. Vascular/Lymphatic: Normal caliber abdominal aorta. Portal vein is patent. No portal venous or mesenteric gas. Small perigastric lymph node in the region of inflammation measuring 7 mm, series 5, image 27, likely reactive but nonspecific. Reproductive: Status post hysterectomy. No adnexal masses. Other: Fat stranding in the upper abdomen adjacent gastric wall thickening. No significant ascites or free fluid. There is no free air or focal fluid collection. No abdominal wall hernia. Musculoskeletal: There are no acute or suspicious osseous abnormalities. IMPRESSION: 1. Abnormal peri pyloric wall thickening about the stomach with adjacent fat stranding. Fluid density outpouching with thinning of the gastric wall inferiorly in the region of wall thickening is suspicious for gastric ulcer. A degree of surrounding fat stranding raises concern for impending or contained perforation, however no adjacent free air, ascites, or focal fluid collection. Recommend correlation with endoscopy to exclude possibility of underlying neoplasm. 2. Small hiatal hernia with mild wall thickening of the distal esophagus, can be seen with reflux or esophagitis. 3. Colonic diverticulosis without diverticulitis. 4. Hepatic steatosis. 5. Tiny hypodensity in the proximal pancreatic body measures 6 x 4 mm, likely benign, and in general stable from 2014 exam. Recommend follow up pre and post contrast MRI/MRCP or pancreatic protocol CT in 2 years. This recommendation  follows ACR consensus guidelines: Management of Incidental Pancreatic Cysts: A White Paper of the ACR Incidental Findings Committee. J Am Coll Radiol 25361;44:315-400 6. Equivocal bladder wall thickening. Aortic Atherosclerosis (ICD10-I70.0). Electronically Signed   By: MKeith RakeM.D.   On: 08/01/2020 19:49      IMPRESSION AND PLAN:   1.  Impending or contained gastric ulcer perforation. -The patient will be admitted to a medically monitored bed. -Pain management will be provided. -The patient was seen with Dr. SLysle Pearlwho at this time thinks that the patient does not require urgent surgical intervention. -GI consultation will be obtained. -I notified Dr. AVicente Malesabout the patient. -We will place the patient on IV PPI therapy.  2.  Essential hypertension. -We will continue Norvasc and chlorthalidone.  3.  Dyslipidemia. -We will continue statin therapy.  4.  Depression. -We will continue Wellbutrin XL.  5.  Type II diabetes mellitus. -The patient will be placed on supplement coverage with NovoLog.  6.  DVT prophylaxis. -SCDs. -Medical prophylaxis currently held off for high risk of bleeding.  7.  GI prophylaxis. -This was addressed with PPI therapy as mentioned above.   All the records are reviewed and case discussed with ED provider. The plan of care was discussed in details with the patient (and family). I answered all questions. The patient agreed to proceed with the above mentioned plan. Further management will depend upon hospital course.   CODE STATUS: Full code  Status is: Inpatient  Remains inpatient appropriate because:Ongoing diagnostic testing needed not appropriate for outpatient work up, Unsafe d/c plan, IV treatments appropriate due to intensity of illness or inability to take PO and Inpatient level of care appropriate due to severity of illness   Dispo: The patient is from: Home              Anticipated d/c is to: Home              Anticipated d/c date  is: 2 days              Patient currently is not medically stable to d/c.  TOTAL TIME TAKING CARE OF THIS PATIENT: 55 minutes.    Christel Mormon M.D on 08/01/2020 at 8:57 PM  [facility]   From 7 PM-7 AM, contact night-coverage [email or web address]  CC: Primary care physician; Glean Hess, MD   Note: This dictation was prepared with Dragon dictation along with smaller phrase technology. Any transcriptional typo errors that result from this process are unintentional.

## 2020-08-01 NOTE — Telephone Encounter (Signed)
Noted - yes patient needs to go to the ER.   CM

## 2020-08-01 NOTE — Consult Note (Signed)
Subjective:   CC: abdominal pain  HPI:  Betty Vaughn is a 69 y.o. female who was consulted by Tamala Julian for evaluation of above.  First noted 1 month ago.  Symptoms include: Pain is sharp, worsening, started in LLQ and now mainly in epigastric region.  Exacerbated by palpation.  Alleviated by nothing specific.  Associated with melena, hematochizia, which started about one week ago, but now has resolved.    Initially thought it was "leaky gut" so took some lemon juice to alleviate pain, did not help.  Pain continued to worsen so came into ED.  No reported NSAID use but mobic listed as medication, takes daily baby aspirin.  States she has had EGD in past with her colonoscopy, but cannot recall ever having camera placed through mouth and only mentioned colon polyps findings.  Nothing in system to indicate EGD done with her most cscope in 2018   Past Medical History:  has a past medical history of Allergy, Arthritis, Chronic insomnia, Colon cancer ([place]), Depression, Diabetes mellitus without complication ([place]), GERD (gastroesophageal reflux disease), colonic polyps, Hyperlipidemia, Hypertension, Neuromuscular disorder ([place]), and Psoriasis.  Past Surgical History:  has a past surgical history that includes Cervical fusion (2005); Cholecystectomy; Abdominal hysterectomy; rigger finger; Polypectomy (2000); Hand surgery (Bilateral, 2001); Coronary angioplasty (2011); Colonoscopy with propofol (N/A, 06/12/2017); polypectomy (N/A, 06/12/2017); and Cervical fusion (01/2018).  Family History: family history includes Breast cancer (age of onset: 50) in her sister; Breast cancer (age of onset: 65) in her mother; CAD in her father; Cancer in her brother and sister; Diabetes in her daughter and mother; Heart attack in her father and sister; Kidney failure in her daughter; Sickle cell anemia in an other family member.  Social History:  reports that she quit smoking about 4 months ago. Her smoking use included  cigarettes. She has a 7.50 pack-year smoking history. She has never used smokeless tobacco. She reports current alcohol use of about 1.0 standard drink of alcohol per week. She reports that she does not use drugs.  Current Medications:  Prior to Admission medications   Medication Sig Start Date End Date Taking? Authorizing Provider  Accu-Chek FastClix Lancets MISC USE TO TEST BLOOD SUGAR TWICE DAILY 06/19/19   Glean Hess, MD  ACCU-CHEK GUIDE test strip USE TO TEST BLOOD SUGAR TWICE DAILY 06/19/19   Glean Hess, MD  amLODipine (NORVASC) 5 MG tablet TAKE 1 TABLET BY MOUTH AT BEDTIME 04/26/20   Glean Hess, MD  Apoaequorin (PREVAGEN PO) Take by mouth.    [provider]  ASPIRIN LOW DOSE 81 MG EC tablet TAKE 1 TABLET BY MOUTH EVERY DAY 06/25/20   Glean Hess, MD  atorvastatin (LIPITOR) 40 MG tablet TAKE 1 TABLET BY MOUTH EVERY DAY 04/26/20   Glean Hess, MD  Blood Glucose Monitoring Suppl (ACCU-CHEK GUIDE ME) w/Device KIT TO TEST TWICE DAILY 06/19/19   Glean Hess, MD  Calcium Carbonate-Vitamin D 600-400 MG-UNIT tablet TAKE 1 TABLET BY MOUTH EVERY DAY 09/25/19   Glean Hess, MD  chlorthalidone (HYGROTON) 25 MG tablet TAKE 1 TABLET BY MOUTH EVERY DAY 04/26/20   Glean Hess, MD  DULoxetine (CYMBALTA) 60 MG capsule TAKE 1 CAPSULE(60 MG) BY MOUTH DAILY 03/27/20   Glean Hess, MD  gabapentin (NEURONTIN) 300 MG capsule TAKE 2 CAPSULES(600 MG) BY MOUTH TWICE DAILY 06/25/20   Glean Hess, MD  lactobacillus acidophilus (BACID) TABS tablet Take 2 tablets by mouth 3 (three) times daily.  [provider]  meloxicam (MOBIC) 15 MG tablet TAKE 1 TABLET(15 MG) BY MOUTH DAILY 05/26/20   Glean Hess, MD  methocarbamol (ROBAXIN) 500 MG tablet TAKE 1 TABLET BY MOUTH FOUR TIMES DAILY 02/26/20   Glean Hess, MD  metoprolol tartrate (LOPRESSOR) 50 MG tablet TAKE 1 TABLET BY MOUTH TWICE DAILY 10/25/19   Glean Hess, MD  Multiple  Minerals-Vitamins (CALCIUM & VIT D3 BONE HEALTH PO) Take by mouth. 600-400 mg    [provider]  nitroGLYCERIN (NITROSTAT) 0.4 MG SL tablet Place under the tongue.    [provider]  omeprazole (PRILOSEC) 20 MG capsule TAKE 1 CAPSULE(20 MG) BY MOUTH TWICE DAILY BEFORE A MEAL 05/26/20   Glean Hess, MD  potassium chloride SA (KLOR-CON) 20 MEQ tablet TAKE 1 TABLET BY MOUTH EVERY DAY 04/26/20   Glean Hess, MD  tamsulosin (FLOMAX) 0.4 MG CAPS capsule TAKE 1 CAPSULE BY MOUTH EVERY DAY 07/28/19   Glean Hess, MD  traZODone (DESYREL) 50 MG tablet Take 1 tablet (50 mg total) by mouth at bedtime. 03/09/20   Glean Hess, MD  VENTOLIN HFA 108 [phone] Base) MCG/ACT inhaler INHALE 2 PUFFS INTO THE LUNGS EVERY 6 HOURS AS NEEDED FOR WHEEZING OR SHORTNESS OF BREATH 04/26/19   Glean Hess, MD    Allergies:  Allergies  Allergen Reactions  . Azithromycin Swelling  . Propoxyphene Anaphylaxis  . Tramadol Anaphylaxis  . Penicillins Swelling  . Piroxicam Rash  . Sertraline Hcl Rash    ROS:  General: Denies weight loss, weight gain, fatigue, fevers, chills, and night sweats. Eyes: Denies blurry vision, double vision, eye pain, itchy eyes, and tearing. Ears: Denies hearing loss, earache, and ringing in ears. Nose: Denies sinus pain, congestion, infections, runny nose, and nosebleeds. Mouth/throat: Denies hoarseness, sore throat, bleeding gums, and difficulty swallowing. Heart: Denies chest pain, palpitations, racing heart, irregular heartbeat, leg pain or swelling, and decreased activity tolerance. Respiratory: Denies breathing difficulty, shortness of breath, wheezing, cough, and sputum. GI: Denies change in appetite, heartburn, constipation, diarrhea, and  GU: Denies difficulty urinating, pain with urinating, urgency, frequency, blood in urine. Musculoskeletal: Denies joint stiffness, pain, swelling, muscle weakness. Skin: Denies rash, itching, mass, tumors, sores, and  boils Neurologic: Denies headache, fainting, dizziness, seizures, numbness, and tingling. Psychiatric: Denies depression, anxiety, difficulty sleeping, and memory loss. Endocrine: Denies heat or cold intolerance, and increased thirst or urination. Blood/lymph: Denies easy bruising, easy bruising, and swollen glands     Objective:     BP 132/71 (BP Location: Left Arm)   Pulse 73   Temp 98.8 F (37.1 C) (Oral)   Resp 20   SpO2 98%   Constitutional :  alert, cooperative, appears stated age and no distress  Lymphatics/Throat:  no asymmetry, masses, or scars  Respiratory:  clear to auscultation bilaterally  Cardiovascular:  regular rate and rhythm  Gastrointestinal: soft, focal guarding with palpation in epigastric region, complains of TTP in all four quadrants but no guarding outside epigastric region.  .  Musculoskeletal: Steady gait and movement  Skin: Cool and moist.  Psychiatric: Normal affect, non-agitated, not confused       LABS:  CMP Latest Ref Rng & Units 08/01/2020 07/13/2020 03/09/2020  Glucose 70 - 99 mg/dL 117(H) 104(H) 113(H)  BUN 8 - 23 mg/dL 21 15 12   Creatinine 0.44 - 1.00 mg/dL 1.16(H) 1.01(H) 1.27(H)  Sodium 135 - 145 mmol/L 137 136 141  Potassium 3.5 - 5.1 mmol/L 3.7 3.7 4.5  Chloride 98 - 111 mmol/L 99 98 103  CO2 22 - 32 mmol/L 28 28 24   Calcium 8.9 - 10.3 mg/dL 9.8 9.4 10.0  Total Protein 6.5 - 8.1 g/dL - 7.7 7.2  Total Bilirubin 0.3 - 1.2 mg/dL - 0.8 0.3  Alkaline Phos 38 - 126 U/L - 79 104  AST 15 - 41 U/L - 31 24  ALT 0 - 44 U/L - 28 18   CBC Latest Ref Rng & Units 08/01/2020 07/13/2020 03/09/2020  WBC 4.0 - 10.5 K/uL 10.2 7.5 6.6  Hemoglobin 12.0 - 15.0 g/dL 13.7 12.2 12.7  Hematocrit 36 - 46 % 40.3 36.9 37.3  Platelets 150 - 400 K/uL 287 227 233    RADS: CLINICAL DATA:  Abdominal pain, acute, nonlocalized diffuse chronic pain. difficult exam due to voluntary guarding. Hx hysterectomy and cholecystectomy  EXAM: CT ABDOMEN AND PELVIS WITH  CONTRAST  TECHNIQUE: Multidetector CT imaging of the abdomen and pelvis was performed using the standard protocol following bolus administration of intravenous contrast.  CONTRAST:  161m OMNIPAQUE IOHEXOL 300 MG/ML  SOLN  COMPARISON:  Abdominal CT 07/06/2013  FINDINGS: Lower chest: No pleural effusion or focal airspace disease. Small hiatal hernia with wall thickening of the distal esophagus.  Hepatobiliary: Mild diffuse hepatic steatosis. No focal hepatic lesion. Mild focal fatty infiltration adjacent to the falciform ligament. Cholecystectomy with prominent common bile duct measuring 9 mm, within normal limits for post cholecystectomy status. Previous intrahepatic biliary ductal dilatation has resolved.  Pancreas: No ductal dilatation or inflammation. Tiny hypodensity in the proximal pancreatic body, series 2, image 25 measures approximately 6 x 4 mm.  Spleen: Normal in size without focal abnormality.  Adrenals/Urinary Tract: No adrenal nodule. Slight left adrenal thickening. No hydronephrosis or perinephric edema. Homogeneous renal enhancement with symmetric excretion on delayed phase imaging. Slightly lobulated bilateral renal contours. There are tiny bilateral renal cortical hypodensities that are too small to characterize. Urinary bladder is partially distended with equivocal wall thickening.  Stomach/Bowel: Small hiatal hernia with mild wall thickening of the distal esophagus. There is abnormal peri pyloric wall thickening about the stomach with adjacent fat stranding. Fluid density outpouching with thinning of the gastric wall inferiorly in the region of wall thickening is suspicious for gastric ulcer, series 6, image 44 and series 2, image 25. There is no adjacent free air or focal fluid collection. No small bowel obstruction, evident wall thickening or inflammation. Normal appendix. Cecum is slightly high-riding in the right mid abdomen. Colonic  diverticulosis, prominent in the descending and sigmoid colon, occasional in the right colon. No acute diverticulitis.  Vascular/Lymphatic: Normal caliber abdominal aorta. Portal vein is patent. No portal venous or mesenteric gas. Small perigastric lymph node in the region of inflammation measuring 7 mm, series 5, image 27, likely reactive but nonspecific.  Reproductive: Status post hysterectomy. No adnexal masses.  Other: Fat stranding in the upper abdomen adjacent gastric wall thickening. No significant ascites or free fluid. There is no free air or focal fluid collection. No abdominal wall hernia.  Musculoskeletal: There are no acute or suspicious osseous abnormalities.  IMPRESSION: 1. Abnormal peri pyloric wall thickening about the stomach with adjacent fat stranding. Fluid density outpouching with thinning of the gastric wall inferiorly in the region of wall thickening is suspicious for gastric ulcer. A degree of surrounding fat stranding raises concern for impending or contained perforation, however no adjacent free air, ascites, or focal fluid collection. Recommend correlation with endoscopy to exclude possibility of underlying neoplasm. 2. Small  hiatal hernia with mild wall thickening of the distal esophagus, can be seen with reflux or esophagitis. 3. Colonic diverticulosis without diverticulitis. 4. Hepatic steatosis. 5. Tiny hypodensity in the proximal pancreatic body measures 6 x 4 mm, likely benign, and in general stable from 2014 exam. Recommend follow up pre and post contrast MRI/MRCP or pancreatic protocol CT in 2 years. This recommendation follows ACR consensus guidelines: Management of Incidental Pancreatic Cysts: A White Paper of the ACR Incidental Findings Committee. J Am Coll Radiol 8366;29:476-546. 6. Equivocal bladder wall thickening.  Aortic Atherosclerosis (ICD10-I70.0).   Electronically Signed   By: Keith Rake M.D.   On: 08/01/2020  19:49  Assessment:      Abdominal pain, recent hx of melena and hematochizia, CT findings as above.  Plan:     Vitals stable, but she does take B-blockers, clinical exam positive for TTP but will not call it acute abdomen at this point.  Pt otherwise acts fairly comfortable when not palpating abdomen, walking to bathroom with no assistance. Lack of free air on CT also reassuring this likely is not a acute perforation of gastric ulcer that requires emergent surgical intervention.  GI cocktail given in ED per report made the pain significantly better, but now starting to worsen again.  Discussed case with GI and they did not recommend urgent endoscopic intervention.  Recommended continued observation and serial abdominal exams, which I agree with along with PPI drip.  Surgery will continue to monitor, with low threshold on proceeding with surgical intervention if any indication of clinical deterioration.  Pt also hesitant on any surgical intervention at this time, and is agreeable to plan noted above.

## 2020-08-02 DIAGNOSIS — E876 Hypokalemia: Secondary | ICD-10-CM

## 2020-08-02 DIAGNOSIS — K253 Acute gastric ulcer without hemorrhage or perforation: Secondary | ICD-10-CM

## 2020-08-02 LAB — CBC
HCT: 32.9 % — ABNORMAL LOW (ref 36.0–46.0)
Hemoglobin: 11.7 g/dL — ABNORMAL LOW (ref 12.0–15.0)
MCH: 32.6 pg (ref 26.0–34.0)
MCHC: 35.6 g/dL (ref 30.0–36.0)
MCV: 91.6 fL (ref 80.0–100.0)
Platelets: 232 10*3/uL (ref 150–400)
RBC: 3.59 MIL/uL — ABNORMAL LOW (ref 3.87–5.11)
RDW: 12.9 % (ref 11.5–15.5)
WBC: 8.6 10*3/uL (ref 4.0–10.5)
nRBC: 0 % (ref 0.0–0.2)

## 2020-08-02 LAB — HEMOGLOBIN A1C
Hgb A1c MFr Bld: 6.1 % — ABNORMAL HIGH (ref 4.8–5.6)
Mean Plasma Glucose: 128.37 mg/dL

## 2020-08-02 LAB — HIV ANTIBODY (ROUTINE TESTING W REFLEX): HIV Screen 4th Generation wRfx: NONREACTIVE

## 2020-08-02 LAB — BASIC METABOLIC PANEL
Anion gap: 12 (ref 5–15)
BUN: 20 mg/dL (ref 8–23)
CO2: 24 mmol/L (ref 22–32)
Calcium: 8.6 mg/dL — ABNORMAL LOW (ref 8.9–10.3)
Chloride: 101 mmol/L (ref 98–111)
Creatinine, Ser: 0.94 mg/dL (ref 0.44–1.00)
GFR calc Af Amer: >60 mL/min (ref >60)
GFR calc non Af Amer: >60 mL/min (ref >60)
Glucose, Bld: 77 mg/dL (ref 70–99)
Potassium: 3.3 mmol/L — ABNORMAL LOW (ref 3.5–5.1)
Sodium: 137 mmol/L (ref 135–145)

## 2020-08-02 LAB — GLUCOSE, CAPILLARY
Glucose-Capillary: 76 mg/dL (ref 70–99)
Glucose-Capillary: 70 mg/dL (ref 70–99)
Glucose-Capillary: 128 mg/dL — ABNORMAL HIGH (ref 70–99)
Glucose-Capillary: 95 mg/dL (ref 70–99)
Glucose-Capillary: 87 mg/dL (ref 70–99)

## 2020-08-02 MED ORDER — DEXTROSE-NACL 5-0.9 % IV SOLN: INTRAVENOUS | Status: DC

## 2020-08-02 MED ORDER — INSULIN ASPART 100 UNIT/ML ~~LOC~~ SOLN
0.0000 [IU] | SUBCUTANEOUS | Status: DC
  Administered 2020-08-02 – 2020-08-03 (×2): 1 [IU] via SUBCUTANEOUS
  Administered 2020-08-03: 2 [IU] via SUBCUTANEOUS
  Filled 2020-08-02 (×3): qty 1

## 2020-08-02 NOTE — Consult Note (Signed)
Cephas Darby, MD [address]  [place]  [place], [place] [address]  Main: [phone]  Fax: [phone] Pager: [phone]   Consultation  Referring Provider:     No ref. provider found Primary Care Physician:  Glean Hess, MD Primary Gastroenterologist: Althia Forts         Reason for Consultation:     Peptic ulcer, abdominal pain  Date of Admission:  08/01/2020 Date of Consultation:  08/02/2020         HPI:   Betty Vaughn is a 69 y.o. female with history of metabolic syndrome, presented to ER with acute on chronic predominantly upper abdominal pain.  Pain started approximately about 2 months ago, has been worsening for the last 1 week, worst pain yesterday, therefore presented to the ER.  She also had an episode of rectal bleeding last week for 1 day only.  She denies fever, chills, vomiting, coffee-ground emesis, hematemesis, melena.  She noticed pain radiating to her chest as well as to the back, has been taking omeprazole 20 mg at home. On presentation, her vitals were stable, hemoglobin was 11.7, dropped from 13.7 at baseline.  Normal BUN/creatinine, normal LFTs.  Patient underwent CT abdomen and pelvis with contrast which revealed small hiatal hernia with mild thickening of the distal esophagus.  There is abnormal peripyloric wall thickening about the stomach with adjacent fat stranding.  Fluid density outpouching with thinning of the gastric wall inferiorly in the region of wall thickening is suspicious for gastric ulcer and concern for impending perforation.  There was no evidence of free air, abscess or free fluid collection  Patient is evaluated by the surgical service, Dr. Lysle Pearl who did not recommend emergent surgical intervention due to lack of acute perforation of gastric ulcer and close monitoring.  Patient is started on pantoprazole drip, kept n.p.o.  Patient reports pain is gradually improving.  She has been taking Mobic daily for several years in  addition to gabapentin for arthritis pain.  She denies any other NSAID use including BC powder Goody powder.  She had history of tobacco use, 7.5 pack years, quit about 4 months ago.  Denies heavy alcohol use.  NSAIDs: Mobic daily  Antiplts/Anticoagulants/Anti thrombotics: Aspirin 81 mg  GI Procedures:  Colonoscopy 06/12/2017 by Dr. Allen Norris - One 10 mm polyp in the rectum, removed with a cold snare. Resected and retrieved. - Two 2 to 3 mm polyps in the rectum, removed with a cold biopsy forceps. Resected and retrieved. - Non-bleeding internal hemorrhoids. Pathology showed tubular adenoma  Past Medical History:  Diagnosis Date  . Allergy   . Arthritis   . Chronic insomnia   . Colon cancer ([facility])    polyp was cancerous  . Depression   . Diabetes mellitus without complication ([facility])   . GERD (gastroesophageal reflux disease)   . Hx of colonic polyps   . Hyperlipidemia   . Hypertension   . Neuromuscular disorder ([facility])   . Psoriasis    scalp    Past Surgical History:  Procedure Laterality Date  . ABDOMINAL HYSTERECTOMY     total  . CERVICAL FUSION  2005  . CERVICAL FUSION  01/2018   C2-T2 posterior laminectomy and fusion; removal of previous hardware  . CHOLECYSTECTOMY    . COLONOSCOPY WITH PROPOFOL N/A 06/12/2017   Procedure: COLONOSCOPY WITH PROPOFOL;  Surgeon: Lucilla Lame, MD;  Location: [facility];  Service: Gastroenterology;  Laterality: N/A;  . CORONARY ANGIOPLASTY  2011   "insignifiant  CAD"  . HAND SURGERY Bilateral 2001  . POLYPECTOMY  2000  . POLYPECTOMY N/A 06/12/2017   Procedure: POLYPECTOMY;  Surgeon: Lucilla Lame, MD;  Location: [facility];  Service: Gastroenterology;  Laterality: N/A;  . rigger finger      Prior to Admission medications   Medication Sig Start Date End Date Taking? Authorizing Provider  Accu-Chek FastClix Lancets MISC USE TO TEST BLOOD SUGAR TWICE DAILY 06/19/19   Glean Hess, MD  ACCU-CHEK GUIDE test strip USE TO TEST  BLOOD SUGAR TWICE DAILY 06/19/19   Glean Hess, MD  amLODipine (NORVASC) 5 MG tablet TAKE 1 TABLET BY MOUTH AT BEDTIME 04/26/20   Glean Hess, MD  Apoaequorin (PREVAGEN PO) Take by mouth.    [provider]  ASPIRIN LOW DOSE 81 MG EC tablet TAKE 1 TABLET BY MOUTH EVERY DAY 06/25/20   Glean Hess, MD  atorvastatin (LIPITOR) 40 MG tablet TAKE 1 TABLET BY MOUTH EVERY DAY 04/26/20   Glean Hess, MD  Blood Glucose Monitoring Suppl (ACCU-CHEK GUIDE ME) w/Device KIT TO TEST TWICE DAILY 06/19/19   Glean Hess, MD  Calcium Carbonate-Vitamin D 600-400 MG-UNIT tablet TAKE 1 TABLET BY MOUTH EVERY DAY 09/25/19   Glean Hess, MD  chlorthalidone (HYGROTON) 25 MG tablet TAKE 1 TABLET BY MOUTH EVERY DAY 04/26/20   Glean Hess, MD  DULoxetine (CYMBALTA) 60 MG capsule TAKE 1 CAPSULE(60 MG) BY MOUTH DAILY 03/27/20   Glean Hess, MD  gabapentin (NEURONTIN) 300 MG capsule TAKE 2 CAPSULES(600 MG) BY MOUTH TWICE DAILY 06/25/20   Glean Hess, MD  lactobacillus acidophilus (BACID) TABS tablet Take 2 tablets by mouth 3 (three) times daily.    [provider]  meloxicam (MOBIC) 15 MG tablet TAKE 1 TABLET(15 MG) BY MOUTH DAILY 05/26/20   Glean Hess, MD  methocarbamol (ROBAXIN) 500 MG tablet TAKE 1 TABLET BY MOUTH FOUR TIMES DAILY 02/26/20   Glean Hess, MD  metoprolol tartrate (LOPRESSOR) 50 MG tablet TAKE 1 TABLET BY MOUTH TWICE DAILY 10/25/19   Glean Hess, MD  Multiple Minerals-Vitamins (CALCIUM & VIT D3 BONE HEALTH PO) Take by mouth. 600-400 mg    [provider]  nitroGLYCERIN (NITROSTAT) 0.4 MG SL tablet Place under the tongue.    [provider]  omeprazole (PRILOSEC) 20 MG capsule TAKE 1 CAPSULE(20 MG) BY MOUTH TWICE DAILY BEFORE A MEAL 05/26/20   Glean Hess, MD  potassium chloride SA (KLOR-CON) 20 MEQ tablet TAKE 1 TABLET BY MOUTH EVERY DAY 04/26/20   Glean Hess, MD  tamsulosin (FLOMAX) 0.4 MG CAPS capsule TAKE 1  CAPSULE BY MOUTH EVERY DAY Patient not taking: Reported on 08/02/2020 07/28/19   Glean Hess, MD  traZODone (DESYREL) 50 MG tablet Take 1 tablet (50 mg total) by mouth at bedtime. 03/09/20   Glean Hess, MD  VENTOLIN HFA 108 [phone] Base) MCG/ACT inhaler INHALE 2 PUFFS INTO THE LUNGS EVERY 6 HOURS AS NEEDED FOR WHEEZING OR SHORTNESS OF BREATH 04/26/19   Glean Hess, MD   Current Facility-Administered Medications:  .  acetaminophen (TYLENOL) tablet 650 mg, 650 mg, Oral, Q6H PRN **OR** acetaminophen (TYLENOL) suppository 650 mg, 650 mg, Rectal, Q6H PRN, Mansy, Jan A, MD .  acidophilus (RISAQUAD) capsule 2 capsule, 2 capsule, Oral, TID, Mansy, Jan A, MD, 2 capsule at 08/02/20 0824 .  albuterol (PROVENTIL) (2.5 MG/3ML) 0.083% nebulizer solution 2.5 mg, 2.5 mg, Inhalation, Q4H PRN, Mansy, Jan A,  MD .  amLODipine (NORVASC) tablet 5 mg, 5 mg, Oral, QHS, Mansy, Jan A, MD, 5 mg at 08/01/20 2338 .  atorvastatin (LIPITOR) tablet 40 mg, 40 mg, Oral, Daily, Mansy, Jan A, MD, 40 mg at 08/01/20 2338 .  calcium-vitamin D (OSCAL WITH D) 500-200 MG-UNIT per tablet 1 tablet, 1 tablet, Oral, Daily, Mansy, Jan A, MD, 1 tablet at 08/02/20 [phone]  chlorthalidone (HYGROTON) tablet 25 mg, 25 mg, Oral, Daily, Mansy, Jan A, MD, 25 mg at 08/02/20 0824 .  dextrose 5 %-0.9 % sodium chloride infusion, , Intravenous, Continuous, Wyvonnia Dusky, MD, Last Rate: 75 mL/hr at 08/02/20 0801, New Bag at 08/02/20 0801 .  DULoxetine (CYMBALTA) DR capsule 60 mg, 60 mg, Oral, Daily, Mansy, Jan A, MD, 60 mg at 08/02/20 0824 .  fentaNYL (SUBLIMAZE) injection 12.5-25 mcg, 12.5-25 mcg, Intravenous, Q2H PRN, Mansy, Jan A, MD .  gabapentin (NEURONTIN) capsule 600 mg, 600 mg, Oral, BID, Mansy, Jan A, MD, 600 mg at 08/02/20 4193 .  insulin aspart (novoLOG) injection 0-9 Units, 0-9 Units, Subcutaneous, Q4H, Dallie Piles, RPH, 1 Units at 08/02/20 1218 .  magnesium hydroxide (MILK OF MAGNESIA) suspension 30 mL, 30 mL, Oral, Daily PRN,  Mansy, Jan A, MD .  methocarbamol (ROBAXIN) tablet 500 mg, 500 mg, Oral, QID, Mansy, Jan A, MD, 500 mg at 08/02/20 1218 .  metoprolol tartrate (LOPRESSOR) tablet 50 mg, 50 mg, Oral, BID, Mansy, Jan A, MD, 50 mg at 08/02/20 7902 .  nitroGLYCERIN (NITROSTAT) SL tablet 0.4 mg, 0.4 mg, Sublingual, Q5 min PRN, Mansy, Jan A, MD .  ondansetron (ZOFRAN) tablet 4 mg, 4 mg, Oral, Q6H PRN **OR** ondansetron (ZOFRAN) injection 4 mg, 4 mg, Intravenous, Q6H PRN, Mansy, Jan A, MD .  pantoprazole (PROTONIX) 80 mg in sodium chloride 0.9 % 100 mL (0.8 mg/mL) infusion, 8 mg/hr, Intravenous, Continuous, Sakai, Isami, DO, Last Rate: 10 mL/hr at 08/02/20 1404, 8 mg/hr at 08/02/20 1404 .  [START ON 08/05/2020] pantoprazole (PROTONIX) injection 40 mg, 40 mg, Intravenous, Q12H, Sakai, Isami, DO .  potassium chloride SA (KLOR-CON) CR tablet 20 mEq, 20 mEq, Oral, Daily, Mansy, Jan A, MD, 20 mEq at 08/02/20 0823 .  tamsulosin (FLOMAX) capsule 0.4 mg, 0.4 mg, Oral, Daily, Mansy, Jan A, MD, 0.4 mg at 08/02/20 0823 .  traZODone (DESYREL) tablet 50 mg, 50 mg, Oral, QHS, Mansy, Jan A, MD, 50 mg at 08/01/20 2338   Family History  Problem Relation Age of Onset  . Breast cancer Mother 40  . Diabetes Mother   . CAD Father   . Heart attack Father   . Diabetes Daughter   . Kidney failure Daughter   . Breast cancer Sister 7  . Sickle cell anemia Other   . Cancer Brother        lung  . Heart attack Sister   . Cancer Sister        ovarian  . Kidney cancer Neg Hx   . Bladder Cancer Neg Hx      Social History   Tobacco Use  . Smoking status: Former Smoker    Packs/day: 0.25    Years: 30.00    Pack years: 7.50    Types: Cigarettes    Quit date: 03/25/2020    Years since quitting: 0.3  . Smokeless tobacco: Never Used  Vaping Use  . Vaping Use: Every day  Substance Use Topics  . Alcohol use: Yes    Alcohol/week: 1.0 standard drink    Types: 1  Glasses of wine per week    Comment: occ  . Drug use: No    Allergies  as of 08/01/2020 - Review Complete 08/01/2020  Allergen Reaction Noted  . Azithromycin Swelling 11/10/2016  . Propoxyphene Anaphylaxis 05/13/2015  . Tramadol Anaphylaxis 05/13/2015  . Penicillins Swelling 05/13/2015  . Piroxicam Rash 05/13/2015  . Sertraline hcl Rash 05/13/2015    Review of Systems:    All systems reviewed and negative except where noted in HPI.   Physical Exam:  Vital signs in last 24 hours: Temp:  [98.4 F (36.9 C)-99 F (37.2 C)] 98.7 F (37.1 C) (09/08 1143) Pulse Rate:  [67-79] 67 (09/08 1143) Resp:  [16-20] 16 (09/08 1143) BP: (101-140)/(59-90) 105/65 (09/08 1143) SpO2:  [92 %-100 %] 99 % (09/08 1143) Weight:  [66 kg] 66 kg (09/07 2200) Last BM Date: 08/01/20 General:   Pleasant, cooperative in NAD Head:  Normocephalic and atraumatic. Eyes:   No icterus.   Conjunctiva pink. PERRLA. Ears:  Normal auditory acuity. Neck:  Supple; no masses or thyroidomegaly Lungs: Respirations even and unlabored. Lungs clear to auscultation bilaterally.   No wheezes, crackles, or rhonchi.  Heart:  Regular rate and rhythm;  Without murmur, clicks, rubs or gallops Abdomen:  Soft, nondistended, tenderness in epigastric and right upper quadrant region. Normal bowel sounds. No appreciable masses or hepatomegaly.  With voluntary guarding, no rebound tenderness.  Rectal:  Not performed. Msk:  Symmetrical without gross deformities.  Strength normal Extremities:  Without edema, cyanosis or clubbing. Neurologic:  Alert and oriented x3;  grossly normal neurologically. Skin:  Intact without significant lesions or rashes. Psych:  Alert and cooperative. Normal affect.  LAB RESULTS: CBC Latest Ref Rng & Units 08/02/2020 08/01/2020 07/13/2020  WBC 4.0 - 10.5 K/uL 8.6 10.2 7.5  Hemoglobin 12.0 - 15.0 g/dL 11.7(L) 13.7 12.2  Hematocrit 36 - 46 % 32.9(L) 40.3 36.9  Platelets 150 - 400 K/uL 232 287 227    BMET BMP Latest Ref Rng & Units 08/02/2020 08/01/2020 07/13/2020  Glucose 70 - 99 mg/dL  77 117(H) 104(H)  BUN 8 - 23 mg/dL _0 Creatinine 0.44 - 1.00 mg/dL 0.94 1.16(H) 1.01(H)  BUN/Creat Ratio 12 - 28 - - -  Sodium 135 - 145 mmol/L 137 137 136  Potassium 3.5 - 5.1 mmol/L 3.3(L) 3.7 3.7  Chloride 98 - 111 mmol/L 101 99 98  CO2 22 - 32 mmol/L _1 Calcium 8.9 - 10.3 mg/dL 8.6(L) 9.8 9.4    LFT Hepatic Function Latest Ref Rng & Units 07/13/2020 03/09/2020 09/08/2019  Total Protein 6.5 - 8.1 g/dL 7.7 7.2 7.6  Albumin 3.5 - 5.0 g/dL 4.2 4.5 4.4  AST 15 - 41 U/L _2 ALT 0 - 44 U/L _3 Alk Phosphatase 38 - 126 U/L 79 104 94  Total Bilirubin 0.3 - 1.2 mg/dL 0.8 0.3 0.3     STUDIES: DG Chest 2 View  Result Date: 08/01/2020 CLINICAL DATA:  Chest pain EXAM: CHEST - 2 VIEW COMPARISON:  September 03, 2019 FINDINGS: Lungs are clear. Heart size and pulmonary vascularity are normal. No adenopathy. No pneumothorax. There is postoperative change in the lower cervical and upper thoracic regions. IMPRESSION: Lungs clear.  Cardiac silhouette within normal limits. Electronically Signed   By: Lowella Grip III M.D.   On: 08/01/2020 14:33   CT ABDOMEN PELVIS W CONTRAST  Result Date: 08/01/2020 CLINICAL DATA:  Abdominal pain, acute, nonlocalized diffuse chronic pain. difficult exam  due to voluntary guarding. Hx hysterectomy and cholecystectomy EXAM: CT ABDOMEN AND PELVIS WITH CONTRAST TECHNIQUE: Multidetector CT imaging of the abdomen and pelvis was performed using the standard protocol following bolus administration of intravenous contrast. CONTRAST:  13m OMNIPAQUE IOHEXOL 300 MG/ML  SOLN COMPARISON:  Abdominal CT 07/06/2013 FINDINGS: Lower chest: No pleural effusion or focal airspace disease. Small hiatal hernia with wall thickening of the distal esophagus. Hepatobiliary: Mild diffuse hepatic steatosis. No focal hepatic lesion. Mild focal fatty infiltration adjacent to the falciform ligament. Cholecystectomy with prominent common bile duct measuring 9 mm, within normal  limits for post cholecystectomy status. Previous intrahepatic biliary ductal dilatation has resolved. Pancreas: No ductal dilatation or inflammation. Tiny hypodensity in the proximal pancreatic body, series 2, image 25 measures approximately 6 x 4 mm. Spleen: Normal in size without focal abnormality. Adrenals/Urinary Tract: No adrenal nodule. Slight left adrenal thickening. No hydronephrosis or perinephric edema. Homogeneous renal enhancement with symmetric excretion on delayed phase imaging. Slightly lobulated bilateral renal contours. There are tiny bilateral renal cortical hypodensities that are too small to characterize. Urinary bladder is partially distended with equivocal wall thickening. Stomach/Bowel: Small hiatal hernia with mild wall thickening of the distal esophagus. There is abnormal peri pyloric wall thickening about the stomach with adjacent fat stranding. Fluid density outpouching with thinning of the gastric wall inferiorly in the region of wall thickening is suspicious for gastric ulcer, series 6, image 44 and series 2, image 25. There is no adjacent free air or focal fluid collection. No small bowel obstruction, evident wall thickening or inflammation. Normal appendix. Cecum is slightly high-riding in the right mid abdomen. Colonic diverticulosis, prominent in the descending and sigmoid colon, occasional in the right colon. No acute diverticulitis. Vascular/Lymphatic: Normal caliber abdominal aorta. Portal vein is patent. No portal venous or mesenteric gas. Small perigastric lymph node in the region of inflammation measuring 7 mm, series 5, image 27, likely reactive but nonspecific. Reproductive: Status post hysterectomy. No adnexal masses. Other: Fat stranding in the upper abdomen adjacent gastric wall thickening. No significant ascites or free fluid. There is no free air or focal fluid collection. No abdominal wall hernia. Musculoskeletal: There are no acute or suspicious osseous abnormalities.  IMPRESSION: 1. Abnormal peri pyloric wall thickening about the stomach with adjacent fat stranding. Fluid density outpouching with thinning of the gastric wall inferiorly in the region of wall thickening is suspicious for gastric ulcer. A degree of surrounding fat stranding raises concern for impending or contained perforation, however no adjacent free air, ascites, or focal fluid collection. Recommend correlation with endoscopy to exclude possibility of underlying neoplasm. 2. Small hiatal hernia with mild wall thickening of the distal esophagus, can be seen with reflux or esophagitis. 3. Colonic diverticulosis without diverticulitis. 4. Hepatic steatosis. 5. Tiny hypodensity in the proximal pancreatic body measures 6 x 4 mm, likely benign, and in general stable from 2014 exam. Recommend follow up pre and post contrast MRI/MRCP or pancreatic protocol CT in 2 years. This recommendation follows ACR consensus guidelines: Management of Incidental Pancreatic Cysts: A White Paper of the ACR Incidental Findings Committee. J Am Coll Radiol [id number];[id number] 6. Equivocal bladder wall thickening. Aortic Atherosclerosis (ICD10-I70.0). Electronically Signed   By: MKeith RakeM.D.   On: 08/01/2020 19:49      Impression / Plan:   JJamesa Tedrickis a 69y.o. female with history of metabolic syndrome, s/p cholecystectomy, s/p hysterectomy presented with acute on chronic worsening upper abdominal pain, CT revealing impending perforation of gastric ulcer.  Peptic ulcer: Continue pantoprazole drip Strict n.p.o. except meds Performing upper endoscopy in setting of impending perforation of gastric ulcer is associated with higher risk than benefit Recommend close monitoring and repeat imaging in 48 to 72 hours before making decision on upper endoscopy General surgery is on board, appreciate recommendations Check H. pylori IgG Strictly avoid NSAID use   Mild normocytic anemia: Patient reports history of dark  stools about a week ago Recommend iron panel, B12 and folate panel Monitor CBC daily Continue pantoprazole drip  Thank you for involving me in the care of this patient.  GI will follow along with you    LOS: 1 day   Sherri Sear, MD  08/02/2020, 2:31 PM   Note: This dictation was prepared with Dragon dictation along with smaller phrase technology. Any transcriptional errors that result from this process are unintentional.

## 2020-08-02 NOTE — Progress Notes (Signed)
PROGRESS NOTE    Betty Vaughn  FTD:[id number] DOB: [birth year] DOA: 08/01/2020 PCP: Glean Hess, MD    Assessment & Plan:   Active Problems:   Perforated gastric ulcer ([place])   Contained gastric ulcer perforation: likely occurred in past & slowing improving as per gen surg. No acute surgical intervention necessary as per gen surg. Continue on IV pantoprazole. NPO w/ sips of water w/ meds. No acute endoscopy currently and will re-evaluate in 48-72 hours as per GI. Pt denies NSAID use but was taking baby aspirin daily   Hypokalemia: KCl repleted. Will continue to monitor   HTN: will continue on amlodipine, chlorthalidone   HLD: continue on statin   Depression: severity unknown. Will continue on home dose of duloxetine.   DM2: poorly controlled. Continue on SSI w/ accuchecks   Likely peripheral neuropathy: will continue on gabapentin   Pancreas hypodensity: tiny as per CT scan, stable from 2014. Recommend repeat CT in 2 years  DVT prophylaxis: SCDs Code Status: Family Communication: discussed pt's care w/ pt's family at bedside and answered their questions Disposition Plan: likely d/c back home   Status is: Inpatient  Remains inpatient appropriate because:IV treatments appropriate due to intensity of illness or inability to take PO   Dispo: The patient is from: Home              Anticipated d/c is to: Home              Anticipated d/c date is: > 3 days              Patient currently is not medically stable to d/c.      Consultants:   GI  General surg    Procedures   Antimicrobials:    Subjective: Pt c/o intermittent abd pain   Objective: Vitals:   08/01/20 2200 08/02/20 0034 08/02/20 0437 08/02/20 0742  BP: 132/82 118/81 (!) 101/59 105/70  Pulse: 75 67 73 74  Resp: 20 18 20 16   Temp: 98.4 F (36.9 C) 98.6 F (37 C) 99 F (37.2 C) 99 F (37.2 C)  TempSrc: Oral Oral Oral Oral  SpO2: 100% 100% 96% 92%  Weight: 66 kg     Height: 5\' 1"   (1.549 m)       Intake/Output Summary (Last 24 hours) at 08/02/2020 0901 Last data filed at 08/02/2020 0827 Gross per 24 hour  Intake 218 ml  Output 350 ml  Net -132 ml   Filed Weights   08/01/20 2200  Weight: 66 kg    Examination:  General exam: Appears calm and comfortable  Respiratory system: Clear to auscultation. Respiratory effort normal. Cardiovascular system: S1 & S2 +. No  rubs, gallops or clicks. No pedal edema. Gastrointestinal system: Abdomen is nondistended, soft and tenderness to palpation. Hypoactive bowel sounds heard. Central nervous system: Alert and oriented. Moves all 4 extremities  Psychiatry: Judgement and insight appear normal. Mood & affect appropriate.     Data Reviewed: I have personally reviewed following labs and imaging studies  CBC: Recent Labs  Lab 08/01/20 1357 08/02/20 0413  WBC 10.2 8.6  HGB 13.7 11.7*  HCT 40.3 32.9*  MCV 92.9 91.6  PLT 287 062   Basic Metabolic Panel: Recent Labs  Lab 08/01/20 1357 08/02/20 0413  NA 137 137  K 3.7 3.3*  CL 99 101  CO2 28 24  GLUCOSE 117* 77  BUN 21 20  CREATININE 1.16* 0.94  CALCIUM 9.8 8.6*   GFR: Estimated Creatinine Clearance:  49.8 mL/min (by C-G formula based on SCr of 0.94 mg/dL). Liver Function Tests: No results for input(s): AST, ALT, ALKPHOS, BILITOT, PROT, ALBUMIN in the last 168 hours. Recent Labs  Lab 08/01/20 1357  LIPASE 28   No results for input(s): AMMONIA in the last 168 hours. Coagulation Profile: No results for input(s): INR, PROTIME in the last 168 hours. Cardiac Enzymes: No results for input(s): CKTOTAL, CKMB, CKMBINDEX, TROPONINI in the last 168 hours. BNP (last 3 results) No results for input(s): PROBNP in the last 8760 hours. HbA1C: Recent Labs    08/01/20 1357  HGBA1C 6.1*   CBG: Recent Labs  Lab 08/01/20 2246 08/02/20 0351 08/02/20 0740  GLUCAP 106* 76 70   Lipid Profile: No results for input(s): CHOL, HDL, LDLCALC, TRIG, CHOLHDL, LDLDIRECT in  the last 72 hours. Thyroid Function Tests: No results for input(s): TSH, T4TOTAL, FREET4, T3FREE, THYROIDAB in the last 72 hours. Anemia Panel: No results for input(s): VITAMINB12, FOLATE, FERRITIN, TIBC, IRON, RETICCTPCT in the last 72 hours. Sepsis Labs: No results for input(s): PROCALCITON, LATICACIDVEN in the last 168 hours.  No results found for this or any previous visit (from the past 240 hour(s)).       Radiology Studies: DG Chest 2 View  Result Date: 08/01/2020 CLINICAL DATA:  Chest pain EXAM: CHEST - 2 VIEW COMPARISON:  September 03, 2019 FINDINGS: Lungs are clear. Heart size and pulmonary vascularity are normal. No adenopathy. No pneumothorax. There is postoperative change in the lower cervical and upper thoracic regions. IMPRESSION: Lungs clear.  Cardiac silhouette within normal limits. Electronically Signed   By: Lowella Grip III M.D.   On: 08/01/2020 14:33   CT ABDOMEN PELVIS W CONTRAST  Result Date: 08/01/2020 CLINICAL DATA:  Abdominal pain, acute, nonlocalized diffuse chronic pain. difficult exam due to voluntary guarding. Hx hysterectomy and cholecystectomy EXAM: CT ABDOMEN AND PELVIS WITH CONTRAST TECHNIQUE: Multidetector CT imaging of the abdomen and pelvis was performed using the standard protocol following bolus administration of intravenous contrast. CONTRAST:  155mL OMNIPAQUE IOHEXOL 300 MG/ML  SOLN COMPARISON:  Abdominal CT 07/06/2013 FINDINGS: Lower chest: No pleural effusion or focal airspace disease. Small hiatal hernia with wall thickening of the distal esophagus. Hepatobiliary: Mild diffuse hepatic steatosis. No focal hepatic lesion. Mild focal fatty infiltration adjacent to the falciform ligament. Cholecystectomy with prominent common bile duct measuring 9 mm, within normal limits for post cholecystectomy status. Previous intrahepatic biliary ductal dilatation has resolved. Pancreas: No ductal dilatation or inflammation. Tiny hypodensity in the proximal pancreatic  body, series 2, image 25 measures approximately 6 x 4 mm. Spleen: Normal in size without focal abnormality. Adrenals/Urinary Tract: No adrenal nodule. Slight left adrenal thickening. No hydronephrosis or perinephric edema. Homogeneous renal enhancement with symmetric excretion on delayed phase imaging. Slightly lobulated bilateral renal contours. There are tiny bilateral renal cortical hypodensities that are too small to characterize. Urinary bladder is partially distended with equivocal wall thickening. Stomach/Bowel: Small hiatal hernia with mild wall thickening of the distal esophagus. There is abnormal peri pyloric wall thickening about the stomach with adjacent fat stranding. Fluid density outpouching with thinning of the gastric wall inferiorly in the region of wall thickening is suspicious for gastric ulcer, series 6, image 44 and series 2, image 25. There is no adjacent free air or focal fluid collection. No small bowel obstruction, evident wall thickening or inflammation. Normal appendix. Cecum is slightly high-riding in the right mid abdomen. Colonic diverticulosis, prominent in the descending and sigmoid colon, occasional in the right  colon. No acute diverticulitis. Vascular/Lymphatic: Normal caliber abdominal aorta. Portal vein is patent. No portal venous or mesenteric gas. Small perigastric lymph node in the region of inflammation measuring 7 mm, series 5, image 27, likely reactive but nonspecific. Reproductive: Status post hysterectomy. No adnexal masses. Other: Fat stranding in the upper abdomen adjacent gastric wall thickening. No significant ascites or free fluid. There is no free air or focal fluid collection. No abdominal wall hernia. Musculoskeletal: There are no acute or suspicious osseous abnormalities. IMPRESSION: 1. Abnormal peri pyloric wall thickening about the stomach with adjacent fat stranding. Fluid density outpouching with thinning of the gastric wall inferiorly in the region of wall  thickening is suspicious for gastric ulcer. A degree of surrounding fat stranding raises concern for impending or contained perforation, however no adjacent free air, ascites, or focal fluid collection. Recommend correlation with endoscopy to exclude possibility of underlying neoplasm. 2. Small hiatal hernia with mild wall thickening of the distal esophagus, can be seen with reflux or esophagitis. 3. Colonic diverticulosis without diverticulitis. 4. Hepatic steatosis. 5. Tiny hypodensity in the proximal pancreatic body measures 6 x 4 mm, likely benign, and in general stable from 2014 exam. Recommend follow up pre and post contrast MRI/MRCP or pancreatic protocol CT in 2 years. This recommendation follows ACR consensus guidelines: Management of Incidental Pancreatic Cysts: A White Paper of the ACR Incidental Findings Committee. J Am Coll Radiol 7893;81:[id number]. 6. Equivocal bladder wall thickening. Aortic Atherosclerosis (ICD10-I70.0). Electronically Signed   By: Keith Rake M.D.   On: 08/01/2020 19:49        Scheduled Meds:  acidophilus  2 capsule Oral TID   amLODipine  5 mg Oral QHS   atorvastatin  40 mg Oral Daily   calcium-vitamin D  1 tablet Oral Daily   chlorthalidone  25 mg Oral Daily   DULoxetine  60 mg Oral Daily   gabapentin  600 mg Oral BID   insulin aspart  0-9 Units Subcutaneous TID PC & HS   methocarbamol  500 mg Oral QID   metoprolol tartrate  50 mg Oral BID   [START ON 08/05/2020] pantoprazole  40 mg Intravenous Q12H   potassium chloride SA  20 mEq Oral Daily   tamsulosin  0.4 mg Oral Daily   traZODone  50 mg Oral QHS   Continuous Infusions:  dextrose 5 % and 0.9% NaCl 75 mL/hr at 08/02/20 0801   pantoprozole (PROTONIX) infusion 8 mg/hr (08/02/20 0034)     LOS: 1 day    Time spent: 35 mins    Wyvonnia Dusky, MD [facility] Pager [phone]  If 7PM-7AM, please contact night-coverage [email or web address] 08/02/2020, 9:01 AM

## 2020-08-02 NOTE — Progress Notes (Signed)
Subjective:  CC: Betty Vaughn is a 69 y.o. female  Hospital stay day 1,   peptic ulcer  HPI: No new complaints since last exam.  States pain continues to be better  ROS:  General: Denies weight loss, weight gain, fatigue, fevers, chills, and night sweats. Heart: Denies chest pain, palpitations, racing heart, irregular heartbeat, leg pain or swelling, and decreased activity tolerance. Respiratory: Denies breathing difficulty, shortness of breath, wheezing, cough, and sputum. GI: Denies change in appetite, heartburn, nausea, vomiting, constipation, diarrhea, and blood in stool. GU: Denies difficulty urinating, pain with urinating, urgency, frequency, blood in urine.   Objective:   Temp:  [98.4 F (36.9 C)-99 F (37.2 C)] 99 F (37.2 C) (09/08 0437) Pulse Rate:  [67-79] 73 (09/08 0437) Resp:  [18-20] 20 (09/08 0437) BP: (101-142)/(59-90) 101/59 (09/08 0437) SpO2:  [96 %-100 %] 96 % (09/08 0437) Weight:  [66 kg] 66 kg (09/07 2200)     Height: 5\' 1"  (154.9 cm) Weight: 66 kg BMI (Calculated): 27.51   Intake/Output this shift:   Intake/Output Summary (Last 24 hours) at 08/02/2020 0740 Last data filed at 08/01/2020 2333 Gross per 24 hour  Intake 218 ml  Output --  Net 218 ml    Constitutional :  alert, cooperative, appears stated age, no distress and sleeping comfortably when physician entered room  Respiratory:  clear to auscultation bilaterally  Cardiovascular:  regular rate and rhythm  Gastrointestinal: soft, no guarding, still has TTP in all four quadrants but decreased compared to last exam, with no guarding.   Skin: Cool and moist.   Psychiatric: Normal affect, non-agitated, not confused       LABS:  CMP Latest Ref Rng & Units 08/02/2020 08/01/2020 07/13/2020  Glucose 70 - 99 mg/dL 77 117(H) 104(H)  BUN 8 - 23 mg/dL 20 21 15   Creatinine 0.44 - 1.00 mg/dL 0.94 1.16(H) 1.01(H)  Sodium 135 - 145 mmol/L 137 137 136  Potassium 3.5 - 5.1 mmol/L 3.3(L) 3.7 3.7  Chloride 98 - 111  mmol/L 101 99 98  CO2 22 - 32 mmol/L 24 28 28   Calcium 8.9 - 10.3 mg/dL 8.6(L) 9.8 9.4  Total Protein 6.5 - 8.1 g/dL - - 7.7  Total Bilirubin 0.3 - 1.2 mg/dL - - 0.8  Alkaline Phos 38 - 126 U/L - - 79  AST 15 - 41 U/L - - 31  ALT 0 - 44 U/L - - 28   CBC Latest Ref Rng & Units 08/02/2020 08/01/2020 07/13/2020  WBC 4.0 - 10.5 K/uL 8.6 10.2 7.5  Hemoglobin 12.0 - 15.0 g/dL 11.7(L) 13.7 12.2  Hematocrit 36 - 46 % 32.9(L) 40.3 36.9  Platelets 150 - 400 K/uL 232 287 227    RADS: n/a Assessment:   Peptic ulcer, likely perforated in past, pain slowly but surely improving, able to tolerate deep palpation on exam.   Again, reassuring this is not a worsening situation where urgent surgical intervention is needed.  Recommend continuing current management and further evaluation of area of concern noted on CT per GI endoscopy.  Surgery will continue to monitor for any worsening in the meantime.  >15min spent on this encounter reviewing chart, patient care and discussion of prognosis

## 2020-08-02 NOTE — Progress Notes (Addendum)
Subjective:  CC: Betty Vaughn is a 69 y.o. female  Hospital stay day 1,   peptic ulcer  HPI: No new complaints since last exam.  States pain is slightly better now.    ROS:  General: Denies weight loss, weight gain, fatigue, fevers, chills, and night sweats. Heart: Denies chest pain, palpitations, racing heart, irregular heartbeat, leg pain or swelling, and decreased activity tolerance. Respiratory: Denies breathing difficulty, shortness of breath, wheezing, cough, and sputum. GI: Denies change in appetite, heartburn, nausea, vomiting, constipation, diarrhea, and blood in stool. GU: Denies difficulty urinating, pain with urinating, urgency, frequency, blood in urine.   Objective:   Temp:  [98.4 F (36.9 C)-98.8 F (37.1 C)] 98.6 F (37 C) (09/08 0034) Pulse Rate:  [67-79] 67 (09/08 0034) Resp:  [18-20] 18 (09/08 0034) BP: (118-142)/(71-90) 118/81 (09/08 0034) SpO2:  [98 %-100 %] 100 % (09/08 0034) Weight:  [66 kg] 66 kg (09/07 2200)     Height: 5\' 1"  (154.9 cm) Weight: 66 kg BMI (Calculated): 27.51   Intake/Output this shift:   Intake/Output Summary (Last 24 hours) at 08/02/2020 0153 Last data filed at 08/01/2020 2219 Gross per 24 hour  Intake 118 ml  Output --  Net 118 ml    Constitutional :  alert, cooperative, appears stated age, no distress and sleeping comfortably when physician entered room  Respiratory:  clear to auscultation bilaterally  Cardiovascular:  regular rate and rhythm  Gastrointestinal: soft, no guarding, still has TTP in all four quadrants but decreased compared to last exam, with no guarding.   Skin: Cool and moist.   Psychiatric: Normal affect, non-agitated, not confused       LABS:  CMP Latest Ref Rng & Units 08/01/2020 07/13/2020 03/09/2020  Glucose 70 - 99 mg/dL 117(H) 104(H) 113(H)  BUN 8 - 23 mg/dL 21 15 12   Creatinine 0.44 - 1.00 mg/dL 1.16(H) 1.01(H) 1.27(H)  Sodium 135 - 145 mmol/L 137 136 141  Potassium 3.5 - 5.1 mmol/L 3.7 3.7 4.5   Chloride 98 - 111 mmol/L 99 98 103  CO2 22 - 32 mmol/L 28 28 24   Calcium 8.9 - 10.3 mg/dL 9.8 9.4 10.0  Total Protein 6.5 - 8.1 g/dL - 7.7 7.2  Total Bilirubin 0.3 - 1.2 mg/dL - 0.8 0.3  Alkaline Phos 38 - 126 U/L - 79 104  AST 15 - 41 U/L - 31 24  ALT 0 - 44 U/L - 28 18   CBC Latest Ref Rng & Units 08/01/2020 07/13/2020 03/09/2020  WBC 4.0 - 10.5 K/uL 10.2 7.5 6.6  Hemoglobin 12.0 - 15.0 g/dL 13.7 12.2 12.7  Hematocrit 36 - 46 % 40.3 36.9 37.3  Platelets 150 - 400 K/uL 287 227 233    RADS: n/a Assessment:   Peptic ulcer, likely perforated in past, now pain better controlled.  Again, reassuring this is not a worsening situation where urgent surgical intervention is needed.  Recommend continuing current management and further evaluation of area of concern noted on CT per GI endoscopy.  Surgery will continue to monitor for any worsening in the meantime.

## 2020-08-03 DIAGNOSIS — K255 Chronic or unspecified gastric ulcer with perforation: Secondary | ICD-10-CM

## 2020-08-03 LAB — BASIC METABOLIC PANEL
Anion gap: 8 (ref 5–15)
BUN: 13 mg/dL (ref 8–23)
CO2: 26 mmol/L (ref 22–32)
Calcium: 8.4 mg/dL — ABNORMAL LOW (ref 8.9–10.3)
Chloride: 105 mmol/L (ref 98–111)
Creatinine, Ser: 0.95 mg/dL (ref 0.44–1.00)
GFR calc Af Amer: >60 mL/min (ref >60)
GFR calc non Af Amer: >60 mL/min (ref >60)
Glucose, Bld: 109 mg/dL — ABNORMAL HIGH (ref 70–99)
Potassium: 3.3 mmol/L — ABNORMAL LOW (ref 3.5–5.1)
Sodium: 139 mmol/L (ref 135–145)

## 2020-08-03 LAB — GLUCOSE, CAPILLARY
Glucose-Capillary: 100 mg/dL — ABNORMAL HIGH (ref 70–99)
Glucose-Capillary: 101 mg/dL — ABNORMAL HIGH (ref 70–99)
Glucose-Capillary: 139 mg/dL — ABNORMAL HIGH (ref 70–99)
Glucose-Capillary: 173 mg/dL — ABNORMAL HIGH (ref 70–99)
Glucose-Capillary: 69 mg/dL — ABNORMAL LOW (ref 70–99)
Glucose-Capillary: 83 mg/dL (ref 70–99)
Glucose-Capillary: 89 mg/dL (ref 70–99)
Glucose-Capillary: 99 mg/dL (ref 70–99)

## 2020-08-03 LAB — CBC
HCT: 31.9 % — ABNORMAL LOW (ref 36.0–46.0)
Hemoglobin: 10.7 g/dL — ABNORMAL LOW (ref 12.0–15.0)
MCH: 31.8 pg (ref 26.0–34.0)
MCHC: 33.5 g/dL (ref 30.0–36.0)
MCV: 94.7 fL (ref 80.0–100.0)
Platelets: 211 10*3/uL (ref 150–400)
RBC: 3.37 MIL/uL — ABNORMAL LOW (ref 3.87–5.11)
RDW: 12.9 % (ref 11.5–15.5)
WBC: 5.8 10*3/uL (ref 4.0–10.5)
nRBC: 0 % (ref 0.0–0.2)

## 2020-08-03 LAB — IRON AND TIBC
Iron: 58 ug/dL (ref 28–170)
Saturation Ratios: 18 % (ref 10.4–31.8)
TIBC: 315 ug/dL (ref 250–450)
UIBC: 257 ug/dL

## 2020-08-03 LAB — FOLATE: Folate: 31 ng/mL (ref >5.9)

## 2020-08-03 LAB — FERRITIN: Ferritin: 115 ng/mL (ref 11–307)

## 2020-08-03 LAB — VITAMIN B12: Vitamin B-12: 424 pg/mL (ref 180–914)

## 2020-08-03 LAB — MAGNESIUM: Magnesium: 2.2 mg/dL (ref 1.7–2.4)

## 2020-08-03 MED ORDER — POTASSIUM CHLORIDE CRYS ER 20 MEQ PO TBCR
40.0000 meq | EXTENDED_RELEASE_TABLET | Freq: Every day | ORAL | Status: DC
  Administered 2020-08-03 – 2020-08-04 (×2): 40 meq via ORAL
  Filled 2020-08-03 (×2): qty 2

## 2020-08-03 NOTE — Progress Notes (Signed)
Betty Darby, MD [address]  [place]  [place], [place] [address]  Main: [phone]  Fax: [phone] Pager: [phone]   Subjective: Patient continues to feel better.  She is tolerating full liquids today.  She did have one loose bowel movement today.  She is asking if she can go home tomorrow   Objective: Vital signs in last 24 hours: Vitals:   08/03/20 0023 08/03/20 0401 08/03/20 0758 08/03/20 1125  BP: (!) 101/55 (!) 99/55 112/70 112/66  Pulse: (!) 59 (!) 59 68 61  Resp: _0 Temp: 98 F (36.7 C) 98.9 F (37.2 C) 98.4 F (36.9 C) 97.9 F (36.6 C)  TempSrc: Oral Oral Oral Oral  SpO2: 95% 94% 98% 98%  Weight:      Height:       Weight change:   Intake/Output Summary (Last 24 hours) at 08/03/2020 1848 Last data filed at 08/03/2020 0815 Gross per 24 hour  Intake 40 ml  Output 0 ml  Net 40 ml     Exam: Heart:: Regular rate and rhythm, S1S2 present or without murmur or extra heart sounds Lungs: normal and clear to auscultation Abdomen: Soft, mild upper abdominal tenderness, no guarding or rigidity   Lab Results: CBC Latest Ref Rng & Units 08/03/2020 08/02/2020 08/01/2020  WBC 4.0 - 10.5 K/uL 5.8 8.6 10.2  Hemoglobin 12.0 - 15.0 g/dL 10.7(L) 11.7(L) 13.7  Hematocrit 36 - 46 % 31.9(L) 32.9(L) 40.3  Platelets 150 - 400 K/uL 211 232 287   CMP Latest Ref Rng & Units 08/03/2020 08/02/2020 08/01/2020  Glucose 70 - 99 mg/dL 109(H) 77 117(H)  BUN 8 - 23 mg/dL _1 Creatinine 0.44 - 1.00 mg/dL 0.95 0.94 1.16(H)  Sodium 135 - 145 mmol/L 139 137 137  Potassium 3.5 - 5.1 mmol/L 3.3(L) 3.3(L) 3.7  Chloride 98 - 111 mmol/L 105 101 99  CO2 22 - 32 mmol/L _2 Calcium 8.9 - 10.3 mg/dL 8.4(L) 8.6(L) 9.8  Total Protein 6.5 - 8.1 g/dL - - -  Total Bilirubin 0.3 - 1.2 mg/dL - - -  Alkaline Phos 38 - 126 U/L - - -  AST 15 - 41 U/L - - -  ALT 0 - 44 U/L - - -    Micro Results: No results found for this or any previous visit (from the past 240  hour(s)). Studies/Results: CT ABDOMEN PELVIS W CONTRAST  Result Date: 08/01/2020 CLINICAL DATA:  Abdominal pain, acute, nonlocalized diffuse chronic pain. difficult exam due to voluntary guarding. Hx hysterectomy and cholecystectomy EXAM: CT ABDOMEN AND PELVIS WITH CONTRAST TECHNIQUE: Multidetector CT imaging of the abdomen and pelvis was performed using the standard protocol following bolus administration of intravenous contrast. CONTRAST:  173m OMNIPAQUE IOHEXOL 300 MG/ML  SOLN COMPARISON:  Abdominal CT 07/06/2013 FINDINGS: Lower chest: No pleural effusion or focal airspace disease. Small hiatal hernia with wall thickening of the distal esophagus. Hepatobiliary: Mild diffuse hepatic steatosis. No focal hepatic lesion. Mild focal fatty infiltration adjacent to the falciform ligament. Cholecystectomy with prominent common bile duct measuring 9 mm, within normal limits for post cholecystectomy status. Previous intrahepatic biliary ductal dilatation has resolved. Pancreas: No ductal dilatation or inflammation. Tiny hypodensity in the proximal pancreatic body, series 2, image 25 measures approximately 6 x 4 mm. Spleen: Normal in size without focal abnormality. Adrenals/Urinary Tract: No adrenal nodule. Slight left adrenal thickening. No hydronephrosis or perinephric edema. Homogeneous renal enhancement with symmetric excretion on delayed phase  imaging. Slightly lobulated bilateral renal contours. There are tiny bilateral renal cortical hypodensities that are too small to characterize. Urinary bladder is partially distended with equivocal wall thickening. Stomach/Bowel: Small hiatal hernia with mild wall thickening of the distal esophagus. There is abnormal peri pyloric wall thickening about the stomach with adjacent fat stranding. Fluid density outpouching with thinning of the gastric wall inferiorly in the region of wall thickening is suspicious for gastric ulcer, series 6, image 44 and series 2, image 25. There  is no adjacent free air or focal fluid collection. No small bowel obstruction, evident wall thickening or inflammation. Normal appendix. Cecum is slightly high-riding in the right mid abdomen. Colonic diverticulosis, prominent in the descending and sigmoid colon, occasional in the right colon. No acute diverticulitis. Vascular/Lymphatic: Normal caliber abdominal aorta. Portal vein is patent. No portal venous or mesenteric gas. Small perigastric lymph node in the region of inflammation measuring 7 mm, series 5, image 27, likely reactive but nonspecific. Reproductive: Status post hysterectomy. No adnexal masses. Other: Fat stranding in the upper abdomen adjacent gastric wall thickening. No significant ascites or free fluid. There is no free air or focal fluid collection. No abdominal wall hernia. Musculoskeletal: There are no acute or suspicious osseous abnormalities. IMPRESSION: 1. Abnormal peri pyloric wall thickening about the stomach with adjacent fat stranding. Fluid density outpouching with thinning of the gastric wall inferiorly in the region of wall thickening is suspicious for gastric ulcer. A degree of surrounding fat stranding raises concern for impending or contained perforation, however no adjacent free air, ascites, or focal fluid collection. Recommend correlation with endoscopy to exclude possibility of underlying neoplasm. 2. Small hiatal hernia with mild wall thickening of the distal esophagus, can be seen with reflux or esophagitis. 3. Colonic diverticulosis without diverticulitis. 4. Hepatic steatosis. 5. Tiny hypodensity in the proximal pancreatic body measures 6 x 4 mm, likely benign, and in general stable from 2014 exam. Recommend follow up pre and post contrast MRI/MRCP or pancreatic protocol CT in 2 years. This recommendation follows ACR consensus guidelines: Management of Incidental Pancreatic Cysts: A White Paper of the ACR Incidental Findings Committee. J Am Coll Radiol 8756;43:329-518. 6.  Equivocal bladder wall thickening. Aortic Atherosclerosis (ICD10-I70.0). Electronically Signed   By: Keith Rake M.D.   On: 08/01/2020 19:49   Medications:  I have reviewed the patient's current medications. Prior to Admission:  Medications Prior to Admission  Medication Sig Dispense Refill Last Dose  . Accu-Chek FastClix Lancets MISC USE TO TEST BLOOD SUGAR TWICE DAILY 102 each 5   . ACCU-CHEK GUIDE test strip USE TO TEST BLOOD SUGAR TWICE DAILY 100 strip 5   . amLODipine (NORVASC) 5 MG tablet TAKE 1 TABLET BY MOUTH AT BEDTIME 90 tablet 1 07/31/2020  . Apoaequorin (PREVAGEN PO) Take by mouth.     . ASPIRIN LOW DOSE 81 MG EC tablet TAKE 1 TABLET BY MOUTH EVERY DAY (Patient not taking: Reported on 08/02/2020) 90 tablet 1 Completed Course at Unknown time  . atorvastatin (LIPITOR) 40 MG tablet TAKE 1 TABLET BY MOUTH EVERY DAY 90 tablet 1 07/31/2020  . Blood Glucose Monitoring Suppl (ACCU-CHEK GUIDE ME) w/Device KIT TO TEST TWICE DAILY 1 kit 0   . Calcium Carbonate-Vitamin D 600-400 MG-UNIT tablet TAKE 1 TABLET BY MOUTH EVERY DAY 30 tablet 5 07/31/2020  . chlorthalidone (HYGROTON) 25 MG tablet TAKE 1 TABLET BY MOUTH EVERY DAY 90 tablet 1   . DULoxetine (CYMBALTA) 60 MG capsule TAKE 1 CAPSULE(60 MG) BY MOUTH DAILY  90 capsule 1 07/31/2020  . gabapentin (NEURONTIN) 300 MG capsule TAKE 2 CAPSULES(600 MG) BY MOUTH TWICE DAILY 360 capsule 1 07/31/2020  . lactobacillus acidophilus (BACID) TABS tablet Take 2 tablets by mouth 3 (three) times daily.     . meloxicam (MOBIC) 15 MG tablet TAKE 1 TABLET(15 MG) BY MOUTH DAILY (Patient not taking: Reported on 08/02/2020) 90 tablet 2 Completed Course at Unknown time  . methocarbamol (ROBAXIN) 500 MG tablet TAKE 1 TABLET BY MOUTH FOUR TIMES DAILY 120 tablet 5 07/31/2020  . metoprolol tartrate (LOPRESSOR) 50 MG tablet TAKE 1 TABLET BY MOUTH TWICE DAILY 180 tablet 3 07/31/2020  . Multiple Minerals-Vitamins (CALCIUM & VIT D3 BONE HEALTH PO) Take by mouth. 600-400 mg     .  nitroGLYCERIN (NITROSTAT) 0.4 MG SL tablet Place under the tongue.     Marland Kitchen omeprazole (PRILOSEC) 20 MG capsule TAKE 1 CAPSULE(20 MG) BY MOUTH TWICE DAILY BEFORE A MEAL 180 capsule 2   . potassium chloride SA (KLOR-CON) 20 MEQ tablet TAKE 1 TABLET BY MOUTH EVERY DAY 90 tablet 3 07/31/2020  . tamsulosin (FLOMAX) 0.4 MG CAPS capsule TAKE 1 CAPSULE BY MOUTH EVERY DAY (Patient not taking: Reported on 08/02/2020) 90 capsule 1 Not Taking at Unknown time  . traZODone (DESYREL) 50 MG tablet Take 1 tablet (50 mg total) by mouth at bedtime. 90 tablet 1 07/31/2020  . VENTOLIN HFA 108 (90 Base) MCG/ACT inhaler INHALE 2 PUFFS INTO THE LUNGS EVERY 6 HOURS AS NEEDED FOR WHEEZING OR SHORTNESS OF BREATH 54 g 1    Scheduled: . acidophilus  2 capsule Oral TID  . amLODipine  5 mg Oral QHS  . atorvastatin  40 mg Oral Daily  . calcium-vitamin D  1 tablet Oral Daily  . chlorthalidone  25 mg Oral Daily  . DULoxetine  60 mg Oral Daily  . gabapentin  600 mg Oral BID  . insulin aspart  0-9 Units Subcutaneous Q4H  . methocarbamol  500 mg Oral QID  . metoprolol tartrate  50 mg Oral BID  . [START ON 08/05/2020] pantoprazole  40 mg Intravenous Q12H  . potassium chloride SA  40 mEq Oral Daily  . tamsulosin  0.4 mg Oral Daily  . traZODone  50 mg Oral QHS   Continuous: . pantoprozole (PROTONIX) infusion 8 mg/hr (08/03/20 1450)   UJW:[id number] **OR** acetaminophen, albuterol, fentaNYL (SUBLIMAZE) injection, magnesium hydroxide, nitroGLYCERIN, ondansetron **OR** ondansetron (ZOFRAN) IV Anti-infectives (From admission, onward)   None     Scheduled Meds: . acidophilus  2 capsule Oral TID  . amLODipine  5 mg Oral QHS  . atorvastatin  40 mg Oral Daily  . calcium-vitamin D  1 tablet Oral Daily  . chlorthalidone  25 mg Oral Daily  . DULoxetine  60 mg Oral Daily  . gabapentin  600 mg Oral BID  . insulin aspart  0-9 Units Subcutaneous Q4H  . methocarbamol  500 mg Oral QID  . metoprolol tartrate  50 mg Oral BID  . [START ON  08/05/2020] pantoprazole  40 mg Intravenous Q12H  . potassium chloride SA  40 mEq Oral Daily  . tamsulosin  0.4 mg Oral Daily  . traZODone  50 mg Oral QHS   Continuous Infusions: . pantoprozole (PROTONIX) infusion 8 mg/hr (08/03/20 1450)   PRN Meds:.acetaminophen **OR** acetaminophen, albuterol, fentaNYL (SUBLIMAZE) injection, magnesium hydroxide, nitroGLYCERIN, ondansetron **OR** ondansetron (ZOFRAN) IV   Assessment: Active Problems:   Perforated gastric ulcer ([place])  Plan: Perforated gastric ulcer, patient is clinically improving Continue pantoprazole  drip Okay with full liquid diet only H. pylori serologies pending Recommend CT abdomen and pelvis with contrast tomorrow, if it remains stable or improving, patient can be discharged home on omeprazole 40 mg 2 times daily and follow-up with GI.  We will plan for upper endoscopy as outpatient   LOS: 2 days   Najai Waszak 08/03/2020, 6:48 PM

## 2020-08-03 NOTE — Progress Notes (Signed)
PROGRESS NOTE    Betty Vaughn  JGG:[id number] DOB: [birth year] DOA: 08/01/2020 PCP: Glean Hess, MD    Assessment & Plan:   Active Problems:   Perforated gastric ulcer ([place])   Contained gastric ulcer perforation: likely occurred in past & slowing improving as per gen surg. No acute surgical intervention necessary as per gen surg. Continue on IV pantoprazole. Will start liquid diet today. No acute endoscopy currently and will re-evaluate in 48-72 hours as per GI. Pt denies NSAID use but was taking baby aspirin daily. Gen surg signed off 08/03/20  Hypokalemia: potassium given. Will continue to monitor   HTN: will continue on amlodipine, chlorthalidone   HLD: continue on atorvastatin   Depression: severity unknown. Continue on home dose of cymbalta   DM2: poorly controlled. Continue on SSI w/ accuchecks   Likely peripheral neuropathy: continue on neurontin   Pancreas hypodensity: tiny as per CT scan, stable from 2014. Recommend repeat CT in 2 years  DVT prophylaxis: SCDs Code Status: full  Family Communication:  Disposition Plan: likely d/c back home   Status is: Inpatient  Remains inpatient appropriate because:IV treatments appropriate due to intensity of illness or inability to take PO   Dispo: The patient is from: Home              Anticipated d/c is to: Home              Anticipated d/c date is: > 3 days              Patient currently is not medically stable to d/c.      Consultants:   GI  General surg    Procedures   Antimicrobials:    Subjective: Pt c/o being hungry   Objective: Vitals:   08/02/20 1940 08/02/20 1942 08/03/20 0023 08/03/20 0401  BP: 117/62  (!) 101/55 (!) 99/55  Pulse: 66 63 (!) 59 (!) 59  Resp: 14  15 14   Temp: 98.7 F (37.1 C)  98 F (36.7 C) 98.9 F (37.2 C)  TempSrc: Oral  Oral Oral  SpO2: 98% 98% 95% 94%  Weight:      Height:        Intake/Output Summary (Last 24 hours) at 08/03/2020 0757 Last data filed at  08/02/2020 2200 Gross per 24 hour  Intake 559.08 ml  Output 350 ml  Net 209.08 ml   Filed Weights   08/01/20 2200  Weight: 66 kg    Examination:  General exam: Appears calm and comfortable  Respiratory system: clear breath sounds b/l. No rales, rhonchi  Cardiovascular system: S1 & S2 +. No  rubs, gallops or clicks. Gastrointestinal system: Abdomen is nondistended, soft and tenderness to palpation. Normal bowel sounds heard. Central nervous system: Alert and oriented. Moves all 4 extremities  Psychiatry: Judgement and insight appear normal. Flat mood and affect     Data Reviewed: I have personally reviewed following labs and imaging studies  CBC: Recent Labs  Lab 08/01/20 1357 08/02/20 0413 08/03/20 0438  WBC 10.2 8.6 5.8  HGB 13.7 11.7* 10.7*  HCT 40.3 32.9* 31.9*  MCV 92.9 91.6 94.7  PLT 287 232 546   Basic Metabolic Panel: Recent Labs  Lab 08/01/20 1357 08/02/20 0413 08/03/20 0438  NA 137 137 139  K 3.7 3.3* 3.3*  CL 99 101 105  CO2 28 24 26   GLUCOSE 117* 77 109*  BUN 21 20 13   CREATININE 1.16* 0.94 0.95  CALCIUM 9.8 8.6* 8.4*  MG  --   --  2.2   GFR: Estimated Creatinine Clearance: 49.3 mL/min (by C-G formula based on SCr of 0.95 mg/dL). Liver Function Tests: No results for input(s): AST, ALT, ALKPHOS, BILITOT, PROT, ALBUMIN in the last 168 hours. Recent Labs  Lab 08/01/20 1357  LIPASE 28   No results for input(s): AMMONIA in the last 168 hours. Coagulation Profile: No results for input(s): INR, PROTIME in the last 168 hours. Cardiac Enzymes: No results for input(s): CKTOTAL, CKMB, CKMBINDEX, TROPONINI in the last 168 hours. BNP (last 3 results) No results for input(s): PROBNP in the last 8760 hours. HbA1C: Recent Labs    08/01/20 1357  HGBA1C 6.1*   CBG: Recent Labs  Lab 08/02/20 1141 08/02/20 1520 08/02/20 1946 08/03/20 0026 08/03/20 0358  GLUCAP 128* 95 87 101* 100*   Lipid Profile: No results for input(s): CHOL, HDL, LDLCALC,  TRIG, CHOLHDL, LDLDIRECT in the last 72 hours. Thyroid Function Tests: No results for input(s): TSH, T4TOTAL, FREET4, T3FREE, THYROIDAB in the last 72 hours. Anemia Panel: No results for input(s): VITAMINB12, FOLATE, FERRITIN, TIBC, IRON, RETICCTPCT in the last 72 hours. Sepsis Labs: No results for input(s): PROCALCITON, LATICACIDVEN in the last 168 hours.  No results found for this or any previous visit (from the past 240 hour(s)).       Radiology Studies: DG Chest 2 View  Result Date: 08/01/2020 CLINICAL DATA:  Chest pain EXAM: CHEST - 2 VIEW COMPARISON:  September 03, 2019 FINDINGS: Lungs are clear. Heart size and pulmonary vascularity are normal. No adenopathy. No pneumothorax. There is postoperative change in the lower cervical and upper thoracic regions. IMPRESSION: Lungs clear.  Cardiac silhouette within normal limits. Electronically Signed   By: Lowella Grip III M.D.   On: 08/01/2020 14:33   CT ABDOMEN PELVIS W CONTRAST  Result Date: 08/01/2020 CLINICAL DATA:  Abdominal pain, acute, nonlocalized diffuse chronic pain. difficult exam due to voluntary guarding. Hx hysterectomy and cholecystectomy EXAM: CT ABDOMEN AND PELVIS WITH CONTRAST TECHNIQUE: Multidetector CT imaging of the abdomen and pelvis was performed using the standard protocol following bolus administration of intravenous contrast. CONTRAST:  161mL OMNIPAQUE IOHEXOL 300 MG/ML  SOLN COMPARISON:  Abdominal CT 07/06/2013 FINDINGS: Lower chest: No pleural effusion or focal airspace disease. Small hiatal hernia with wall thickening of the distal esophagus. Hepatobiliary: Mild diffuse hepatic steatosis. No focal hepatic lesion. Mild focal fatty infiltration adjacent to the falciform ligament. Cholecystectomy with prominent common bile duct measuring 9 mm, within normal limits for post cholecystectomy status. Previous intrahepatic biliary ductal dilatation has resolved. Pancreas: No ductal dilatation or inflammation. Tiny hypodensity  in the proximal pancreatic body, series 2, image 25 measures approximately 6 x 4 mm. Spleen: Normal in size without focal abnormality. Adrenals/Urinary Tract: No adrenal nodule. Slight left adrenal thickening. No hydronephrosis or perinephric edema. Homogeneous renal enhancement with symmetric excretion on delayed phase imaging. Slightly lobulated bilateral renal contours. There are tiny bilateral renal cortical hypodensities that are too small to characterize. Urinary bladder is partially distended with equivocal wall thickening. Stomach/Bowel: Small hiatal hernia with mild wall thickening of the distal esophagus. There is abnormal peri pyloric wall thickening about the stomach with adjacent fat stranding. Fluid density outpouching with thinning of the gastric wall inferiorly in the region of wall thickening is suspicious for gastric ulcer, series 6, image 44 and series 2, image 25. There is no adjacent free air or focal fluid collection. No small bowel obstruction, evident wall thickening or inflammation. Normal appendix. Cecum is slightly high-riding in the right mid abdomen.  Colonic diverticulosis, prominent in the descending and sigmoid colon, occasional in the right colon. No acute diverticulitis. Vascular/Lymphatic: Normal caliber abdominal aorta. Portal vein is patent. No portal venous or mesenteric gas. Small perigastric lymph node in the region of inflammation measuring 7 mm, series 5, image 27, likely reactive but nonspecific. Reproductive: Status post hysterectomy. No adnexal masses. Other: Fat stranding in the upper abdomen adjacent gastric wall thickening. No significant ascites or free fluid. There is no free air or focal fluid collection. No abdominal wall hernia. Musculoskeletal: There are no acute or suspicious osseous abnormalities. IMPRESSION: 1. Abnormal peri pyloric wall thickening about the stomach with adjacent fat stranding. Fluid density outpouching with thinning of the gastric wall  inferiorly in the region of wall thickening is suspicious for gastric ulcer. A degree of surrounding fat stranding raises concern for impending or contained perforation, however no adjacent free air, ascites, or focal fluid collection. Recommend correlation with endoscopy to exclude possibility of underlying neoplasm. 2. Small hiatal hernia with mild wall thickening of the distal esophagus, can be seen with reflux or esophagitis. 3. Colonic diverticulosis without diverticulitis. 4. Hepatic steatosis. 5. Tiny hypodensity in the proximal pancreatic body measures 6 x 4 mm, likely benign, and in general stable from 2014 exam. Recommend follow up pre and post contrast MRI/MRCP or pancreatic protocol CT in 2 years. This recommendation follows ACR consensus guidelines: Management of Incidental Pancreatic Cysts: A White Paper of the ACR Incidental Findings Committee. J Am Coll Radiol 1937;90:240-973. 6. Equivocal bladder wall thickening. Aortic Atherosclerosis (ICD10-I70.0). Electronically Signed   By: Keith Rake M.D.   On: 08/01/2020 19:49        Scheduled Meds: . acidophilus  2 capsule Oral TID  . amLODipine  5 mg Oral QHS  . atorvastatin  40 mg Oral Daily  . calcium-vitamin D  1 tablet Oral Daily  . chlorthalidone  25 mg Oral Daily  . DULoxetine  60 mg Oral Daily  . gabapentin  600 mg Oral BID  . insulin aspart  0-9 Units Subcutaneous Q4H  . methocarbamol  500 mg Oral QID  . metoprolol tartrate  50 mg Oral BID  . [START ON 08/05/2020] pantoprazole  40 mg Intravenous Q12H  . potassium chloride SA  20 mEq Oral Daily  . tamsulosin  0.4 mg Oral Daily  . traZODone  50 mg Oral QHS   Continuous Infusions: . dextrose 5 % and 0.9% NaCl 75 mL/hr at 08/03/20 0641  . pantoprozole (PROTONIX) infusion 8 mg/hr (08/03/20 0028)     LOS: 2 days    Time spent: 33 mins    Wyvonnia Dusky, MD [facility] Pager [phone]  If 7PM-7AM, please contact  night-coverage [email or web address] 08/03/2020, 7:57 AM

## 2020-08-03 NOTE — Progress Notes (Signed)
Subjective:  CC: Betty Vaughn is a 69 y.o. female  Hospital stay day 2,   peptic ulcer  HPI: No new complaints since last exam.  States pain continues to be better  ROS:  General: Denies weight loss, weight gain, fatigue, fevers, chills, and night sweats. Heart: Denies chest pain, palpitations, racing heart, irregular heartbeat, leg pain or swelling, and decreased activity tolerance. Respiratory: Denies breathing difficulty, shortness of breath, wheezing, cough, and sputum. GI: Denies change in appetite, heartburn, nausea, vomiting, constipation, diarrhea, and blood in stool. GU: Denies difficulty urinating, pain with urinating, urgency, frequency, blood in urine.   Objective:   Temp:  [98 F (36.7 C)-98.9 F (37.2 C)] 98.4 F (36.9 C) (09/09 0758) Pulse Rate:  [59-68] 68 (09/09 0758) Resp:  [14-16] 16 (09/09 0758) BP: (99-117)/(55-70) 112/70 (09/09 0758) SpO2:  [94 %-99 %] 98 % (09/09 0758)     Height: 5\' 1"  (154.9 cm) Weight: 66 kg BMI (Calculated): 27.51   Intake/Output this shift:   Intake/Output Summary (Last 24 hours) at 08/03/2020 1037 Last data filed at 08/03/2020 0815 Gross per 24 hour  Intake 559.08 ml  Output 0 ml  Net 559.08 ml    Constitutional :  alert, cooperative, appears stated age, no distress and sleeping comfortably when physician entered room  Respiratory:  clear to auscultation bilaterally  Cardiovascular:  regular rate and rhythm  Gastrointestinal: soft, no guarding, still has TTP in epigastric region, but remaning qudrants are pain free on exam today.   Skin: Cool and moist.   Psychiatric: Normal affect, non-agitated, not confused       LABS:  CMP Latest Ref Rng & Units 08/03/2020 08/02/2020 08/01/2020  Glucose 70 - 99 mg/dL 109(H) 77 117(H)  BUN 8 - 23 mg/dL 13 20 21   Creatinine 0.44 - 1.00 mg/dL 0.95 0.94 1.16(H)  Sodium 135 - 145 mmol/L 139 137 137  Potassium 3.5 - 5.1 mmol/L 3.3(L) 3.3(L) 3.7  Chloride 98 - 111 mmol/L 105 101 99  CO2 22 - 32  mmol/L 26 24 28   Calcium 8.9 - 10.3 mg/dL 8.4(L) 8.6(L) 9.8  Total Protein 6.5 - 8.1 g/dL - - -  Total Bilirubin 0.3 - 1.2 mg/dL - - -  Alkaline Phos 38 - 126 U/L - - -  AST 15 - 41 U/L - - -  ALT 0 - 44 U/L - - -   CBC Latest Ref Rng & Units 08/03/2020 08/02/2020 08/01/2020  WBC 4.0 - 10.5 K/uL 5.8 8.6 10.2  Hemoglobin 12.0 - 15.0 g/dL 10.7(L) 11.7(L) 13.7  Hematocrit 36 - 46 % 31.9(L) 32.9(L) 40.3  Platelets 150 - 400 K/uL 211 232 287    RADS: n/a Assessment:   Peptic ulcer, likely perforated in past, pain continues to improve.  Further workup for CT finding per GI at this point, since surgery will not be necessary unless something drastically changes.  Will be available if needed.

## 2020-08-04 ENCOUNTER — Inpatient Hospital Stay: Payer: Medicare Other

## 2020-08-04 ENCOUNTER — Encounter: Payer: Self-pay | Admitting: Family Medicine

## 2020-08-04 DIAGNOSIS — E1169 Type 2 diabetes mellitus with other specified complication: Secondary | ICD-10-CM

## 2020-08-04 DIAGNOSIS — R1013 Epigastric pain: Secondary | ICD-10-CM

## 2020-08-04 LAB — BASIC METABOLIC PANEL
Anion gap: 7 (ref 5–15)
BUN: 7 mg/dL — ABNORMAL LOW (ref 8–23)
CO2: 28 mmol/L (ref 22–32)
Calcium: 8.8 mg/dL — ABNORMAL LOW (ref 8.9–10.3)
Chloride: 106 mmol/L (ref 98–111)
Creatinine, Ser: 0.93 mg/dL (ref 0.44–1.00)
GFR calc Af Amer: >60 mL/min (ref >60)
GFR calc non Af Amer: >60 mL/min (ref >60)
Glucose, Bld: 95 mg/dL (ref 70–99)
Potassium: 3.8 mmol/L (ref 3.5–5.1)
Sodium: 141 mmol/L (ref 135–145)

## 2020-08-04 LAB — CBC
HCT: 32.1 % — ABNORMAL LOW (ref 36.0–46.0)
Hemoglobin: 11.4 g/dL — ABNORMAL LOW (ref 12.0–15.0)
MCH: 32.2 pg (ref 26.0–34.0)
MCHC: 35.5 g/dL (ref 30.0–36.0)
MCV: 90.7 fL (ref 80.0–100.0)
Platelets: 233 10*3/uL (ref 150–400)
RBC: 3.54 MIL/uL — ABNORMAL LOW (ref 3.87–5.11)
RDW: 12.7 % (ref 11.5–15.5)
WBC: 5.7 10*3/uL (ref 4.0–10.5)
nRBC: 0 % (ref 0.0–0.2)

## 2020-08-04 LAB — H PYLORI, IGM, IGG, IGA AB
H Pylori IgG: 0.25 Index Value (ref 0.00–0.79)
H. Pylogi, Iga Abs: <9 units (ref 0.0–8.9)
H. Pylogi, Igm Abs: <9 units (ref 0.0–8.9)

## 2020-08-04 LAB — GLUCOSE, CAPILLARY
Glucose-Capillary: 120 mg/dL — ABNORMAL HIGH (ref 70–99)
Glucose-Capillary: 103 mg/dL — ABNORMAL HIGH (ref 70–99)

## 2020-08-04 LAB — MAGNESIUM: Magnesium: 2.1 mg/dL (ref 1.7–2.4)

## 2020-08-04 MED ORDER — IOHEXOL 9 MG/ML PO SOLN
1000.0000 mL | Freq: Once | ORAL | Status: AC
  Administered 2020-08-04: 1000 mL via ORAL

## 2020-08-04 MED ORDER — PANTOPRAZOLE SODIUM 40 MG PO TBEC: 40.0000 mg | DELAYED_RELEASE_TABLET | Freq: Two times a day (BID) | ORAL | 0 refills | Status: DC

## 2020-08-04 MED ORDER — IOHEXOL 300 MG/ML  SOLN
100.0000 mL | Freq: Once | INTRAMUSCULAR | Status: AC | PRN
  Administered 2020-08-04: 100 mL via INTRAVENOUS

## 2020-08-04 NOTE — Discharge Summary (Signed)
Physician Discharge Summary  Betty Vaughn TMH:[id number] DOB: 06-22-1951 DOA: 08/01/2020  PCP: Glean Hess, MD  Admit date: 08/01/2020 Discharge date: 08/04/2020  Admitted From: home  Disposition: home   Recommendations for Outpatient Follow-up:  1. Follow up with PCP in 1 week 2. F/u GI in 2 weeks. Outpatient endoscopy will be set up by GI in 2 weeks  Home Health: no  Equipment/Devices:  Discharge Condition: stable  CODE STATUS: full  Diet recommendation: Heart Healthy/carb modified diet   Brief/Interim Summary: HPI was taken from Dr. Sidney Ace: Betty Vaughn  is a 69 y.o. African-American female with a known history of type 2 diabetes mellitus, GERD, hypertension, dyslipidemia and psoriasis, who presented to the emergency room with acute onset of abdominal pain that started last week and has been intermittent and got steady since this morning.  It started in the left lower quadrant and and is currently mainly in the epigastric area.  She admitted to chills but denied any fever.  She had bright red blood in her stools last week.  She admitted to melena with her bowel movements.  No diarrhea or constipation.  No chest pain or dyspnea or cough or wheezing.  No dysuria, oliguria or hematuria or flank pain.  She has been vaccinated against COVID-19.   Upon presentation to the emergency room, blood pressure was 142/90 with otherwise normal vital signs.  Labs revealed unremarkable CMP and high-sensitivity troponin I was 6 and later 6.  CBC was within normal. EKG showed normal sinus rhythm with a rate of 74.  Chest x-ray showed clear lungs with no acute findings.  Abdominal pelvic CT scan revealed: 1. Abnormal peri pyloric wall thickening about the stomach with adjacent fat stranding. Fluid density outpouching with thinning of the gastric wall inferiorly in the region of wall thickening is suspicious for gastric ulcer. A degree of surrounding fat stranding raises concern for impending or  contained perforation, however no adjacent free air, ascites, or focal fluid collection. Recommend correlation with endoscopy to exclude possibility of underlying neoplasm. 2. Small hiatal hernia with mild wall thickening of the distal esophagus, can be seen with reflux or esophagitis. 3. Colonic diverticulosis without diverticulitis. 4. Hepatic steatosis. 5. Tiny hypodensity in the proximal pancreatic body measures 6 x 4 mm, likely benign, and in general stable from 2014 exam.  The patient was given 2.5 mg IV and 4 mg of IV Zofran as well as GI cocktail.  The patient will be admitted to a medically monitored bed for further evaluation and management.  Hospital Course from Dr. Lenise Herald 9/8-9/10/21: Pt was found to have a perforated gastric ulcer on admission. Pt was treated w/ conservative management w/ NPO, IVFs and IV PPI. Pt's abd pain improved and was able to tolerate a diet prior to d/c. Pt was d/c home w/ pantoprazole 41m BID before meals and will have outpatient EGD in 2 weeks. General surg saw the pt and acute surgery was not indicated/recommended. Pt was ambulating and transferring independently so PT/OT were not needed.   Discharge Diagnoses:  Active Problems:   Perforated gastric ulcer ([place] Containedgastric ulcerperforation: likely occurred in past & slowing improving as per gen surg. No acute surgical intervention necessary as per gen surg. Continue on  pantoprazole 452mBID before meals . Tolerated a liquid diet, Endoscopy will occur outpatient in about 2 weeks as per GI. Pt denies NSAID use but was taking baby aspirin daily. Gen surg signed off 08/03/20  Hypokalemia: WNL today.  Will continue  to monitor   HTN: will continue on amlodipine, chlorthalidone   HLD: continue on atorvastatin   Depression: severity unknown. Continue on home dose of cymbalta   DM2: poorly controlled. Continue on SSI w/ accuchecks   Likely peripheral neuropathy: continue on neurontin    Pancreas hypodensity: tiny as per CT scan, stable from 2014. Recommend repeat CT in 2 years   Discharge Instructions  Discharge Instructions    Diet - low sodium heart healthy   Complete by: As directed    Discharge instructions   Complete by: As directed    F/u PCP in 1 week. Will need take pantoprazole 61m twice a day before meals for 3 months. Will need to get refills for this medication from your PCP or GI doctor. F/u GI, Dr. VMarius Ditch in 2 weeks   Increase activity slowly   Complete by: As directed      Allergies as of 08/04/2020      Reactions   Azithromycin Swelling   Propoxyphene Anaphylaxis   Tramadol Anaphylaxis   Penicillins Swelling   Piroxicam Rash   Sertraline Hcl Rash      Medication List    STOP taking these medications   Aspirin Low Dose 81 MG EC tablet Generic drug: aspirin   meloxicam 15 MG tablet Commonly known as: MOBIC   omeprazole 20 MG capsule Commonly known as: PRILOSEC     TAKE these medications   Accu-Chek FastClix Lancets Misc USE TO TEST BLOOD SUGAR TWICE DAILY   Accu-Chek Guide Me w/Device Kit TO TEST TWICE DAILY   Accu-Chek Guide test strip Generic drug: glucose blood USE TO TEST BLOOD SUGAR TWICE DAILY   amLODipine 5 MG tablet Commonly known as: NORVASC TAKE 1 TABLET BY MOUTH AT BEDTIME   atorvastatin 40 MG tablet Commonly known as: LIPITOR TAKE 1 TABLET BY MOUTH EVERY DAY   CALCIUM & VIT D3 BONE HEALTH PO Take by mouth. 600-400 mg   Calcium Carbonate-Vitamin D 600-400 MG-UNIT tablet TAKE 1 TABLET BY MOUTH EVERY DAY   chlorthalidone 25 MG tablet Commonly known as: HYGROTON TAKE 1 TABLET BY MOUTH EVERY DAY   DULoxetine 60 MG capsule Commonly known as: CYMBALTA TAKE 1 CAPSULE(60 MG) BY MOUTH DAILY   gabapentin 300 MG capsule Commonly known as: NEURONTIN TAKE 2 CAPSULES(600 MG) BY MOUTH TWICE DAILY   lactobacillus acidophilus Tabs tablet Take 2 tablets by mouth 3 (three) times daily.   methocarbamol 500 MG  tablet Commonly known as: ROBAXIN TAKE 1 TABLET BY MOUTH FOUR TIMES DAILY   metoprolol tartrate 50 MG tablet Commonly known as: LOPRESSOR TAKE 1 TABLET BY MOUTH TWICE DAILY   nitroGLYCERIN 0.4 MG SL tablet Commonly known as: NITROSTAT Place under the tongue.   pantoprazole 40 MG tablet Commonly known as: Protonix Take 1 tablet (40 mg total) by mouth 2 (two) times daily before a meal.   potassium chloride SA 20 MEQ tablet Commonly known as: KLOR-CON TAKE 1 TABLET BY MOUTH EVERY DAY   PREVAGEN PO Take by mouth.   tamsulosin 0.4 MG Caps capsule Commonly known as: FLOMAX TAKE 1 CAPSULE BY MOUTH EVERY DAY   traZODone 50 MG tablet Commonly known as: DESYREL Take 1 tablet (50 mg total) by mouth at bedtime.   Ventolin HFA 108 (90 Base) MCG/ACT inhaler Generic drug: albuterol INHALE 2 PUFFS INTO THE LUNGS EVERY 6 HOURS AS NEEDED FOR WHEEZING OR SHORTNESS OF BREATH       Allergies  Allergen Reactions  . Azithromycin Swelling  .  Propoxyphene Anaphylaxis  . Tramadol Anaphylaxis  . Penicillins Swelling  . Piroxicam Rash  . Sertraline Hcl Rash    Consultations:  GI   Procedures/Studies: DG Chest 2 View  Result Date: 08/01/2020 CLINICAL DATA:  Chest pain EXAM: CHEST - 2 VIEW COMPARISON:  September 03, 2019 FINDINGS: Lungs are clear. Heart size and pulmonary vascularity are normal. No adenopathy. No pneumothorax. There is postoperative change in the lower cervical and upper thoracic regions. IMPRESSION: Lungs clear.  Cardiac silhouette within normal limits. Electronically Signed   By: Lowella Grip III M.D.   On: 08/01/2020 14:33   CT ABDOMEN W CONTRAST  Result Date: 08/04/2020 CLINICAL DATA:  Perforated gastric ulcer. EXAM: CT ABDOMEN WITH CONTRAST TECHNIQUE: Multidetector CT imaging of the abdomen was performed using the standard protocol following bolus administration of intravenous contrast. CONTRAST:  165m OMNIPAQUE IOHEXOL 300 MG/ML  SOLN COMPARISON:  08/01/2020  FINDINGS: Lower chest:   Unremarkable. Hepatobiliary: No suspicious focal abnormality within the liver parenchyma. Small area of low attenuation in the anterior liver, adjacent to the falciform ligament, is in a characteristic location for focal fatty deposition. Gallbladder is surgically absent. Common bile duct diameter upper normal at 6 mm. Pancreas: Stable 6 mm hypodensity in the head of pancreas. No main duct dilatation. Spleen: No splenomegaly. No focal mass lesion. Adrenals/Urinary Tract: No adrenal nodule or mass. No suspicious abnormality in either kidney. No hydronephrosis. Stomach/Bowel: Tiny hiatal hernia. Wall thickening seen in the distal esophagus on the previous exam is less prominent today. Marked wall thickening noted in the antral region of the stomach with probable anterior/inferior ulcer crater on axial 23/2 and sagittal 40/6. There is perigastric edema/inflammation in the region of the antrum. No rim enhancing perigastric fluid collection to suggest abscess. No extraluminal free gas. Vascular/Lymphatic: There is abdominal aortic atherosclerosis without aneurysm. There is no gastrohepatic or hepatoduodenal ligament lymphadenopathy. No retroperitoneal or mesenteric lymphadenopathy. Other: No intraperitoneal free fluid. Musculoskeletal: No worrisome lytic or sclerotic osseous abnormality. IMPRESSION: 1. Similar appearance of marked circumferential wall thickening in the gastric antrum with apparent ulcer crater anteriorly/inferiorly. There is associated perigastric edema/inflammation. No extraluminal gas or para gastric abscess at this time. 2. Wall thickening noted in the distal esophagus on the prior study is less prominent today. 3. Tiny 6 mm hypodensity in the pancreas. Recommend follow up pre and post contrast MRI/MRCP or pancreatic protocol CT in 2 years. This recommendation follows ACR consensus guidelines: Management of Incidental Pancreatic Cysts: A White Paper of the ACR incidental  Findings Committee. [id number] Electronically Signed   By: EMisty StanleyM.D.   On: 08/04/2020 13:18   CT ABDOMEN PELVIS W CONTRAST  Result Date: 08/01/2020 CLINICAL DATA:  Abdominal pain, acute, nonlocalized diffuse chronic pain. difficult exam due to voluntary guarding. Hx hysterectomy and cholecystectomy EXAM: CT ABDOMEN AND PELVIS WITH CONTRAST TECHNIQUE: Multidetector CT imaging of the abdomen and pelvis was performed using the standard protocol following bolus administration of intravenous contrast. CONTRAST:  1059mOMNIPAQUE IOHEXOL 300 MG/ML  SOLN COMPARISON:  Abdominal CT 07/06/2013 FINDINGS: Lower chest: No pleural effusion or focal airspace disease. Small hiatal hernia with wall thickening of the distal esophagus. Hepatobiliary: Mild diffuse hepatic steatosis. No focal hepatic lesion. Mild focal fatty infiltration adjacent to the falciform ligament. Cholecystectomy with prominent common bile duct measuring 9 mm, within normal limits for post cholecystectomy status. Previous intrahepatic biliary ductal dilatation has resolved. Pancreas: No ductal dilatation or inflammation. Tiny hypodensity in the proximal pancreatic body, series  2, image 25 measures approximately 6 x 4 mm. Spleen: Normal in size without focal abnormality. Adrenals/Urinary Tract: No adrenal nodule. Slight left adrenal thickening. No hydronephrosis or perinephric edema. Homogeneous renal enhancement with symmetric excretion on delayed phase imaging. Slightly lobulated bilateral renal contours. There are tiny bilateral renal cortical hypodensities that are too small to characterize. Urinary bladder is partially distended with equivocal wall thickening. Stomach/Bowel: Small hiatal hernia with mild wall thickening of the distal esophagus. There is abnormal peri pyloric wall thickening about the stomach with adjacent fat stranding. Fluid density outpouching with thinning of the gastric wall inferiorly in the region of  wall thickening is suspicious for gastric ulcer, series 6, image 44 and series 2, image 25. There is no adjacent free air or focal fluid collection. No small bowel obstruction, evident wall thickening or inflammation. Normal appendix. Cecum is slightly high-riding in the right mid abdomen. Colonic diverticulosis, prominent in the descending and sigmoid colon, occasional in the right colon. No acute diverticulitis. Vascular/Lymphatic: Normal caliber abdominal aorta. Portal vein is patent. No portal venous or mesenteric gas. Small perigastric lymph node in the region of inflammation measuring 7 mm, series 5, image 27, likely reactive but nonspecific. Reproductive: Status post hysterectomy. No adnexal masses. Other: Fat stranding in the upper abdomen adjacent gastric wall thickening. No significant ascites or free fluid. There is no free air or focal fluid collection. No abdominal wall hernia. Musculoskeletal: There are no acute or suspicious osseous abnormalities. IMPRESSION: 1. Abnormal peri pyloric wall thickening about the stomach with adjacent fat stranding. Fluid density outpouching with thinning of the gastric wall inferiorly in the region of wall thickening is suspicious for gastric ulcer. A degree of surrounding fat stranding raises concern for impending or contained perforation, however no adjacent free air, ascites, or focal fluid collection. Recommend correlation with endoscopy to exclude possibility of underlying neoplasm. 2. Small hiatal hernia with mild wall thickening of the distal esophagus, can be seen with reflux or esophagitis. 3. Colonic diverticulosis without diverticulitis. 4. Hepatic steatosis. 5. Tiny hypodensity in the proximal pancreatic body measures 6 x 4 mm, likely benign, and in general stable from 2014 exam. Recommend follow up pre and post contrast MRI/MRCP or pancreatic protocol CT in 2 years. This recommendation follows ACR consensus guidelines: Management of Incidental Pancreatic  Cysts: A White Paper of the ACR Incidental Findings Committee. J Am Coll Radiol [id number];57:[id number]. 6. Equivocal bladder wall thickening. Aortic Atherosclerosis (ICD10-I70.0). Electronically Signed   By: Keith Rake M.D.   On: 08/01/2020 19:49   CT HEAD CODE STROKE WO CONTRAST  Result Date: 07/13/2020 CLINICAL DATA:  Code stroke.  69 year old female with weakness. EXAM: CT HEAD WITHOUT CONTRAST TECHNIQUE: Contiguous axial images were obtained from the base of the skull through the vertex without intravenous contrast. COMPARISON:  Brain MRI 02/25/2015. Report of head CT 08/05/2003 (no images available). FINDINGS: Brain: Mildly decreased cerebral volume since 2016. No midline shift, mass effect, or evidence of intracranial mass lesion. No ventriculomegaly. No acute intracranial hemorrhage identified. Gray-white matter differentiation is within normal limits throughout the brain. No cortically based acute infarct identified. Vascular: Calcified atherosclerosis at the skull base. No suspicious intracranial vascular hyperdensity. Skull: Negative. Sinuses/Orbits: Visualized paranasal sinuses and mastoids are clear. Other: Visualized orbits and scalp soft tissues are within normal limits. ASPECTS [facility] Stroke Program Early CT Score) Total score (0-10 with 10 being normal): 10 IMPRESSION: Normal for age non contrast CT appearance of the brain.  ASPECTS 10. Study discussed by telephone with Dr.  CAMERON ISAACS on 07/13/2020 at 15:54 . Electronically Signed   By: Genevie Ann M.D.   On: 07/13/2020 15:54       Subjective: Pt c/o fatigue    Discharge Exam: Vitals:   08/03/20 1958 08/04/20 1136  BP: 104/63 132/74  Pulse: 69 (!) 55  Resp: 19 16  Temp: 98.4 F (36.9 C)   SpO2: 100% 100%   Vitals:   08/03/20 0758 08/03/20 1125 08/03/20 1958 08/04/20 1136  BP: 112/70 112/66 104/63 132/74  Pulse: 68 61 69 (!) 55  Resp: 16 16 19 16   Temp: 98.4 F (36.9 C) 97.9 F (36.6 C) 98.4 F (36.9 C)   TempSrc: Oral  Oral Oral   SpO2: 98% 98% 100% 100%  Weight:      Height:        General: Pt is alert, awake, not in acute distress Cardiovascular: S1/S2 +, no rubs, no gallops Respiratory: CTA bilaterally, no wheezing, no rhonchi Abdominal: Soft, NT, ND, bowel sounds + Extremities: no edema, no cyanosis    The results of significant diagnostics from this hospitalization (including imaging, microbiology, ancillary and laboratory) are listed below for reference.     Microbiology: No results found for this or any previous visit (from the past 240 hour(s)).   Labs: BNP (last 3 results) No results for input(s): BNP in the last 8760 hours. Basic Metabolic Panel: Recent Labs  Lab 08/01/20 1357 08/02/20 0413 08/03/20 0438 08/04/20 0537  NA 137 137 139 141  K 3.7 3.3* 3.3* 3.8  CL 99 101 105 106  CO2 28 24 26 28   GLUCOSE 117* 77 109* 95  BUN 21 20 13  7*  CREATININE 1.16* 0.94 0.95 0.93  CALCIUM 9.8 8.6* 8.4* 8.8*  MG  --   --  2.2 2.1   Liver Function Tests: No results for input(s): AST, ALT, ALKPHOS, BILITOT, PROT, ALBUMIN in the last 168 hours. Recent Labs  Lab 08/01/20 1357  LIPASE 28   No results for input(s): AMMONIA in the last 168 hours. CBC: Recent Labs  Lab 08/01/20 1357 08/02/20 0413 08/03/20 0438 08/04/20 0537  WBC 10.2 8.6 5.8 5.7  HGB 13.7 11.7* 10.7* 11.4*  HCT 40.3 32.9* 31.9* 32.1*  MCV 92.9 91.6 94.7 90.7  PLT 287 232 211 233   Cardiac Enzymes: No results for input(s): CKTOTAL, CKMB, CKMBINDEX, TROPONINI in the last 168 hours. BNP: Invalid input(s): POCBNP CBG: Recent Labs  Lab 08/03/20 1959 08/03/20 2111 08/03/20 2357 08/04/20 0739 08/04/20 1136  GLUCAP 69* 89 83 120* 103*   D-Dimer No results for input(s): DDIMER in the last 72 hours. Hgb A1c No results for input(s): HGBA1C in the last 72 hours. Lipid Profile No results for input(s): CHOL, HDL, LDLCALC, TRIG, CHOLHDL, LDLDIRECT in the last 72 hours. Thyroid function studies No results for  input(s): TSH, T4TOTAL, T3FREE, THYROIDAB in the last 72 hours.  Invalid input(s): FREET3 Anemia work up Recent Labs    08/03/20 1617  VITAMINB12 424  FOLATE 31.0  FERRITIN 115  TIBC 315  IRON 58   Urinalysis    Component Value Date/Time   COLORURINE [place] 07/06/2013 [place] 06/22/2018 1105   LABSPEC 1.025 07/06/2013 1813   PHURINE 7.0 07/06/2013 1813   GLUCOSEU Negative 06/22/2018 1105   GLUCOSEU Negative 07/06/2013 1813   HGBUR 1+ 07/06/2013 1813   BILIRUBINUR neg 09/08/2019 [place] Negative 06/22/2018 1105   BILIRUBINUR Negative 07/06/2013 1813   KETONESUR Negative 07/06/2013 1813  PROTEINUR Negative 09/08/2019 1145   PROTEINUR Negative 06/22/2018 1105   PROTEINUR Negative 07/06/2013 1813   UROBILINOGEN 0.2 09/08/2019 1145   NITRITE neg 09/08/2019 1145   NITRITE Negative 06/22/2018 1105   NITRITE Negative 07/06/2013 1813   LEUKOCYTESUR Negative 09/08/2019 1145   LEUKOCYTESUR Negative 06/22/2018 1105   LEUKOCYTESUR Negative 07/06/2013 1813   Sepsis Labs Invalid input(s): PROCALCITONIN,  WBC,  LACTICIDVEN Microbiology No results found for this or any previous visit (from the past 240 hour(s)).   Time coordinating discharge: Over 30 minutes  SIGNED:   Wyvonnia Dusky, MD  [facility] 08/04/2020, 2:12 PM Pager   If 7PM-7AM, please contact night-coverage [email or web address]

## 2020-08-04 NOTE — Plan of Care (Signed)
Continuing with plan of care. 

## 2020-08-04 NOTE — Care Management Important Message (Signed)
Important Message  Patient Details  Name: Betty Vaughn MRN: [id number] Date of Birth: [birth year]   Medicare Important Message Given:  Yes     Betty Vaughn 08/04/2020, 1:42 PM

## 2020-08-04 NOTE — Plan of Care (Signed)
Discharge teaching completed with patient who is in stable condition. 

## 2020-08-04 NOTE — Progress Notes (Signed)
Betty Darby, MD [address]  [place]  [place], [place] [address]  Main: [phone]  Fax: [phone] Pager: [phone]   Subjective: Patient continues to feel better.  She is tolerating full liquids today.  She underwent CT abdomen today and had 2 large bowel movements secondary to contrast.   Objective: Vital signs in last 24 hours: Vitals:   08/03/20 0758 08/03/20 1125 08/03/20 1958 08/04/20 1136  BP: 112/70 112/66 104/63 132/74  Pulse: 68 61 69 (!) 55  Resp: _0 Temp: 98.4 F (36.9 C) 97.9 F (36.6 C) 98.4 F (36.9 C)   TempSrc: Oral Oral Oral   SpO2: 98% 98% 100% 100%  Weight:      Height:       Weight change:   Intake/Output Summary (Last 24 hours) at 08/04/2020 1519 Last data filed at 08/04/2020 1500 Gross per 24 hour  Intake 2997.9 ml  Output --  Net 2997.9 ml     Exam: Heart:: Regular rate and rhythm, S1S2 present or without murmur or extra heart sounds Lungs: normal and clear to auscultation Abdomen: Soft, mild upper abdominal tenderness, no guarding or rigidity   Lab Results: CBC Latest Ref Rng & Units 08/04/2020 08/03/2020 08/02/2020  WBC 4.0 - 10.5 K/uL 5.7 5.8 8.6  Hemoglobin 12.0 - 15.0 g/dL 11.4(L) 10.7(L) 11.7(L)  Hematocrit 36 - 46 % 32.1(L) 31.9(L) 32.9(L)  Platelets 150 - 400 K/uL 233 211 232   CMP Latest Ref Rng & Units 08/04/2020 08/03/2020 08/02/2020  Glucose 70 - 99 mg/dL 95 109(H) 77  BUN 8 - 23 mg/dL 7(L) 13 20  Creatinine 0.44 - 1.00 mg/dL 0.93 0.95 0.94  Sodium 135 - 145 mmol/L 141 139 137  Potassium 3.5 - 5.1 mmol/L 3.8 3.3(L) 3.3(L)  Chloride 98 - 111 mmol/L 106 105 101  CO2 22 - 32 mmol/L _1 Calcium 8.9 - 10.3 mg/dL 8.8(L) 8.4(L) 8.6(L)  Total Protein 6.5 - 8.1 g/dL - - -  Total Bilirubin 0.3 - 1.2 mg/dL - - -  Alkaline Phos 38 - 126 U/L - - -  AST 15 - 41 U/L - - -  ALT 0 - 44 U/L - - -    Micro Results: No results found for this or any previous visit (from the past 240  hour(s)). Studies/Results: CT ABDOMEN W CONTRAST  Result Date: 08/04/2020 CLINICAL DATA:  Perforated gastric ulcer. EXAM: CT ABDOMEN WITH CONTRAST TECHNIQUE: Multidetector CT imaging of the abdomen was performed using the standard protocol following bolus administration of intravenous contrast. CONTRAST:  113m OMNIPAQUE IOHEXOL 300 MG/ML  SOLN COMPARISON:  08/01/2020 FINDINGS: Lower chest:   Unremarkable. Hepatobiliary: No suspicious focal abnormality within the liver parenchyma. Small area of low attenuation in the anterior liver, adjacent to the falciform ligament, is in a characteristic location for focal fatty deposition. Gallbladder is surgically absent. Common bile duct diameter upper normal at 6 mm. Pancreas: Stable 6 mm hypodensity in the head of pancreas. No main duct dilatation. Spleen: No splenomegaly. No focal mass lesion. Adrenals/Urinary Tract: No adrenal nodule or mass. No suspicious abnormality in either kidney. No hydronephrosis. Stomach/Bowel: Tiny hiatal hernia. Wall thickening seen in the distal esophagus on the previous exam is less prominent today. Marked wall thickening noted in the antral region of the stomach with probable anterior/inferior ulcer crater on axial 23/2 and sagittal 40/6. There is perigastric edema/inflammation in the region of the antrum. No rim enhancing perigastric fluid collection to suggest  abscess. No extraluminal free gas. Vascular/Lymphatic: There is abdominal aortic atherosclerosis without aneurysm. There is no gastrohepatic or hepatoduodenal ligament lymphadenopathy. No retroperitoneal or mesenteric lymphadenopathy. Other: No intraperitoneal free fluid. Musculoskeletal: No worrisome lytic or sclerotic osseous abnormality. IMPRESSION: 1. Similar appearance of marked circumferential wall thickening in the gastric antrum with apparent ulcer crater anteriorly/inferiorly. There is associated perigastric edema/inflammation. No extraluminal gas or para gastric abscess  at this time. 2. Wall thickening noted in the distal esophagus on the prior study is less prominent today. 3. Tiny 6 mm hypodensity in the pancreas. Recommend follow up pre and post contrast MRI/MRCP or pancreatic protocol CT in 2 years. This recommendation follows ACR consensus guidelines: Management of Incidental Pancreatic Cysts: A White Paper of the ACR incidental Findings Committee. [place] [id number]. Electronically Signed   By: Misty Stanley M.D.   On: 08/04/2020 13:18   Medications:  I have reviewed the patient's current medications. Prior to Admission:  Medications Prior to Admission  Medication Sig Dispense Refill Last Dose  . Accu-Chek FastClix Lancets MISC USE TO TEST BLOOD SUGAR TWICE DAILY 102 each 5   . ACCU-CHEK GUIDE test strip USE TO TEST BLOOD SUGAR TWICE DAILY 100 strip 5   . amLODipine (NORVASC) 5 MG tablet TAKE 1 TABLET BY MOUTH AT BEDTIME 90 tablet 1 07/31/2020  . Apoaequorin (PREVAGEN PO) Take by mouth.     . ASPIRIN LOW DOSE 81 MG EC tablet TAKE 1 TABLET BY MOUTH EVERY DAY (Patient not taking: Reported on 08/02/2020) 90 tablet 1 Completed Course at Unknown time  . atorvastatin (LIPITOR) 40 MG tablet TAKE 1 TABLET BY MOUTH EVERY DAY 90 tablet 1 07/31/2020  . Blood Glucose Monitoring Suppl (ACCU-CHEK GUIDE ME) w/Device KIT TO TEST TWICE DAILY 1 kit 0   . Calcium Carbonate-Vitamin D 600-400 MG-UNIT tablet TAKE 1 TABLET BY MOUTH EVERY DAY 30 tablet 5 07/31/2020  . chlorthalidone (HYGROTON) 25 MG tablet TAKE 1 TABLET BY MOUTH EVERY DAY 90 tablet 1   . DULoxetine (CYMBALTA) 60 MG capsule TAKE 1 CAPSULE(60 MG) BY MOUTH DAILY 90 capsule 1 07/31/2020  . gabapentin (NEURONTIN) 300 MG capsule TAKE 2 CAPSULES(600 MG) BY MOUTH TWICE DAILY 360 capsule 1 07/31/2020  . lactobacillus acidophilus (BACID) TABS tablet Take 2 tablets by mouth 3 (three) times daily.     . meloxicam (MOBIC) 15 MG tablet TAKE 1 TABLET(15 MG) BY MOUTH DAILY (Patient not taking: Reported on 08/02/2020) 90 tablet 2  Completed Course at Unknown time  . methocarbamol (ROBAXIN) 500 MG tablet TAKE 1 TABLET BY MOUTH FOUR TIMES DAILY 120 tablet 5 07/31/2020  . metoprolol tartrate (LOPRESSOR) 50 MG tablet TAKE 1 TABLET BY MOUTH TWICE DAILY 180 tablet 3 07/31/2020  . Multiple Minerals-Vitamins (CALCIUM & VIT D3 BONE HEALTH PO) Take by mouth. 600-400 mg     . nitroGLYCERIN (NITROSTAT) 0.4 MG SL tablet Place under the tongue.     Marland Kitchen omeprazole (PRILOSEC) 20 MG capsule TAKE 1 CAPSULE(20 MG) BY MOUTH TWICE DAILY BEFORE A MEAL 180 capsule 2   . potassium chloride SA (KLOR-CON) 20 MEQ tablet TAKE 1 TABLET BY MOUTH EVERY DAY 90 tablet 3 07/31/2020  . tamsulosin (FLOMAX) 0.4 MG CAPS capsule TAKE 1 CAPSULE BY MOUTH EVERY DAY (Patient not taking: Reported on 08/02/2020) 90 capsule 1 Not Taking at Unknown time  . traZODone (DESYREL) 50 MG tablet Take 1 tablet (50 mg total) by mouth at bedtime. 90 tablet 1 07/31/2020  . VENTOLIN HFA 108 (90 Base)  MCG/ACT inhaler INHALE 2 PUFFS INTO THE LUNGS EVERY 6 HOURS AS NEEDED FOR WHEEZING OR SHORTNESS OF BREATH 54 g 1    Scheduled: . acidophilus  2 capsule Oral TID  . amLODipine  5 mg Oral QHS  . atorvastatin  40 mg Oral Daily  . calcium-vitamin D  1 tablet Oral Daily  . chlorthalidone  25 mg Oral Daily  . DULoxetine  60 mg Oral Daily  . gabapentin  600 mg Oral BID  . insulin aspart  0-9 Units Subcutaneous Q4H  . methocarbamol  500 mg Oral QID  . metoprolol tartrate  50 mg Oral BID  . [START ON 08/05/2020] pantoprazole  40 mg Intravenous Q12H  . potassium chloride SA  40 mEq Oral Daily  . tamsulosin  0.4 mg Oral Daily  . traZODone  50 mg Oral QHS   Continuous: . pantoprozole (PROTONIX) infusion Stopped (08/04/20 1252)   OEH:OZYYQMGNOIBBC **OR** acetaminophen, albuterol, fentaNYL (SUBLIMAZE) injection, magnesium hydroxide, nitroGLYCERIN, ondansetron **OR** ondansetron (ZOFRAN) IV Anti-infectives (From admission, onward)   None     Scheduled Meds: . acidophilus  2 capsule Oral TID  .  amLODipine  5 mg Oral QHS  . atorvastatin  40 mg Oral Daily  . calcium-vitamin D  1 tablet Oral Daily  . chlorthalidone  25 mg Oral Daily  . DULoxetine  60 mg Oral Daily  . gabapentin  600 mg Oral BID  . insulin aspart  0-9 Units Subcutaneous Q4H  . methocarbamol  500 mg Oral QID  . metoprolol tartrate  50 mg Oral BID  . [START ON 08/05/2020] pantoprazole  40 mg Intravenous Q12H  . potassium chloride SA  40 mEq Oral Daily  . tamsulosin  0.4 mg Oral Daily  . traZODone  50 mg Oral QHS   Continuous Infusions: . pantoprozole (PROTONIX) infusion Stopped (08/04/20 1252)   PRN Meds:.acetaminophen **OR** acetaminophen, albuterol, fentaNYL (SUBLIMAZE) injection, magnesium hydroxide, nitroGLYCERIN, ondansetron **OR** ondansetron (ZOFRAN) IV   Assessment: Active Problems:   Perforated gastric ulcer ([place])  Plan: Perforated gastric ulcer, patient is clinically improving Switch to omeprazole 40 mg p.o. twice daily at least for 3 months Diet as tolerated H. pylori serologies pending Repeat CT abdomen with contrast revealed stable ulcer with no evidence of abscess or perforation.  We will arrange EGD as outpatient in 2 weeks She needs to follow-up with me in 4 to 6 weeks in office Patient can be discharged home today Patient expressed understanding of the plan  LOS: 3 days   Starkisha Tullis 08/04/2020, 3:19 PM

## 2020-08-07 ENCOUNTER — Telehealth: Payer: Self-pay

## 2020-08-07 NOTE — Telephone Encounter (Signed)
Attempted to reach patient for TCM call and schedule hospital follow up appt. Left msg for patient to reach me at [phone] or contact the office.

## 2020-08-07 NOTE — Telephone Encounter (Signed)
Transition Care Management Follow-up Telephone Call  Date of discharge and from where: 08/04/20 [facility]  How have you been since you were released from the hospital? Pt states she is doing okay but c/o abdominal pain 8/10. Pt plans to follow up with GI, awaiting appt.   Any questions or concerns? Yes - pt states she cannot afford prevagen or probiotic and would like RX to help cover this cost or replacement rx for prevagen due to feeling forgetful lately and as if she can't keep up with things.   Items Reviewed:  Did the pt receive and understand the discharge instructions provided? Yes   Medications obtained and verified? Yes   Any new allergies since your discharge? No   Dietary orders reviewed? Yes  Do you have support at home? Yes   Functional Questionnaire: (I = Independent and D = Dependent) ADLs: I  Bathing/Dressing- I  Meal Prep- I  Eating- I  Maintaining continence- I  Transferring/Ambulation- I  Managing Meds- I  Follow up appointments reviewed:   PCP Hospital f/u appt confirmed? Yes  Scheduled to see Dr. [name] on 08/15/20 @ 2:20.  [facility] f/u appt confirmed? No , awaiting appt.   Are transportation arrangements needed? No   If their condition worsens, is the pt aware to call PCP or go to the Emergency Dept.? Yes  Was the patient provided with contact information for the PCP's office or ED? Yes  Was to pt encouraged to call back with questions or concerns? Yes

## 2020-08-08 ENCOUNTER — Other Ambulatory Visit: Payer: Self-pay

## 2020-08-08 ENCOUNTER — Telehealth: Payer: Self-pay

## 2020-08-08 DIAGNOSIS — K255 Chronic or unspecified gastric ulcer with perforation: Secondary | ICD-10-CM

## 2020-08-08 NOTE — Telephone Encounter (Signed)
Called and left a message for call back  

## 2020-08-08 NOTE — Telephone Encounter (Signed)
Patient called back and she scheduled for her EGD for 08/22/2020 and scheduled her 2 month follow up appointment. She verbalized understanding of instructions and mailed them to patient.

## 2020-08-08 NOTE — Telephone Encounter (Signed)
-----   Message from Lin Landsman, MD sent at 08/04/2020  3:20 PM EDT ----- Regarding: Schedule EGD and hospital follow-up Please schedule EGD after 2 weeks and clinic follow-up in 2 months Dx: Perforated gastric ulcer  ThanksRohini

## 2020-08-15 ENCOUNTER — Ambulatory Visit (INDEPENDENT_AMBULATORY_CARE_PROVIDER_SITE_OTHER): Payer: Medicare Other | Admitting: Internal Medicine

## 2020-08-15 ENCOUNTER — Other Ambulatory Visit: Payer: Self-pay

## 2020-08-15 ENCOUNTER — Encounter: Payer: Self-pay | Admitting: Internal Medicine

## 2020-08-15 VITALS — BP 104/68 | HR 91 | Ht 61.0 in | Wt 145.0 lb

## 2020-08-15 DIAGNOSIS — K251 Acute gastric ulcer with perforation: Secondary | ICD-10-CM | POA: Diagnosis not present

## 2020-08-15 DIAGNOSIS — M5116 Intervertebral disc disorders with radiculopathy, lumbar region: Secondary | ICD-10-CM

## 2020-08-15 DIAGNOSIS — Z23 Encounter for immunization: Secondary | ICD-10-CM

## 2020-08-15 DIAGNOSIS — E876 Hypokalemia: Secondary | ICD-10-CM | POA: Diagnosis not present

## 2020-08-15 NOTE — Progress Notes (Signed)
Date:  08/15/2020   Name:  Adrien Dietzman   DOB:  1951/02/11   MRN:  [id number]   Chief Complaint: Hospitalization Follow-up Hospital follow up.  Admitted to [facility] 08/01/20 to 08/04/20. TOC call done on 08/07/20.  Discharge Diagnoses:  Active Problems:   Perforated gastric ulcer ([place]) Containedgastric ulcerperforation: likely occurred in past & slowing improving as per gen surg. No acute surgical intervention necessary as per gen surg. Continue on  pantoprazole 17m BID before meals .Tolerated a liquid diet, Endoscopy will occur outpatient in about 2 weeks as per GI. Pt denies NSAID use but was taking baby aspirin daily. Gen surg signed off 08/03/20  Hypokalemia:WNL today. Will continue to monitor   HTN: will continue on amlodipine, chlorthalidone   HHUD:JSHFWYOVon atorvastatin  Depression: severity unknown.Continue on home dose of cymbalta  DM2: poorly controlled. Continue on SSI w/ accuchecks   Likely peripheral neuropathy:continue on neurontin  Pancreas hypodensity: tiny as per CT scan, stable from 2014. Recommend repeat CT in 2 years   HPI  Lab Results  Component Value Date   CREATININE 0.93 08/04/2020   BUN 7 (L) 08/04/2020   NA 141 08/04/2020   K 3.8 08/04/2020   CL 106 08/04/2020   CO2 28 08/04/2020   Lab Results  Component Value Date   CHOL 139 09/08/2019   HDL 43 09/08/2019   LDLCALC 77 09/08/2019   TRIG 101 09/08/2019   CHOLHDL 3.2 09/08/2019   Lab Results  Component Value Date   TSH 2.370 09/08/2019   Lab Results  Component Value Date   HGBA1C 6.1 (H) 08/01/2020   Lab Results  Component Value Date   WBC 5.7 08/04/2020   HGB 11.4 (L) 08/04/2020   HCT 32.1 (L) 08/04/2020   MCV 90.7 08/04/2020   PLT 233 08/04/2020   Lab Results  Component Value Date   ALT 28 07/13/2020   AST 31 07/13/2020   ALKPHOS 79 07/13/2020   BILITOT 0.8 07/13/2020     Review of Systems  Constitutional: Negative for chills, fatigue and fever.    HENT: Negative for trouble swallowing.   Respiratory: Negative for cough, chest tightness and shortness of breath.   Cardiovascular: Negative for chest pain, palpitations and leg swelling.  Gastrointestinal: Positive for abdominal distention and abdominal pain. Negative for constipation, diarrhea and rectal pain.  Musculoskeletal: Positive for arthralgias, back pain, neck pain and neck stiffness.  Neurological: Negative for dizziness and headaches.    Patient Active Problem List   Diagnosis Date Noted  . Perforated gastric ulcer ([place] 08/01/2020  . Myofascial muscle pain 01/22/2019  . Weakness of both arms 09/08/2018  . Renal insufficiency 09/08/2018  . 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 Cervical stenosis of spinal canal 01/14/2018  . Personal history of colonic polyps   . Rectal polyp   . Tobacco use disorder 11/20/2016  . Neuropathy 04/10/2016  . Glucose intolerance (impaired glucose tolerance) 05/13/2015  . Benign essential tremor 05/13/2015  . Chronic insomnia 05/13/2015  . 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 Multiple joint complaints 05/13/2015  . Depression, major, recurrent, in partial remission ([place] 02/23/2014  . Acid reflux 02/23/2014  . Essential hypertension 02/23/2014  . Mixed hyperlipidemia 02/23/2014  . Chronic headache disorder 01/16/2012  . 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 CAD S/P percutaneous coronary angioplasty 03/16/2010    Allergies  Allergen Reactions  . Azithromycin Swelling  . Propoxyphene Anaphylaxis  . Tramadol Anaphylaxis  . Penicillins Swelling  . Piroxicam Rash  .  Sertraline Hcl Rash    Past Surgical History:  Procedure Laterality Date  . ABDOMINAL HYSTERECTOMY     total  . CERVICAL FUSION  2005  . CERVICAL FUSION  01/2018   C2-T2 posterior laminectomy and fusion; removal of previous hardware  . CHOLECYSTECTOMY    . COLONOSCOPY WITH PROPOFOL N/A 06/12/2017   Procedure: COLONOSCOPY WITH PROPOFOL;  Surgeon: Lucilla Lame, MD;  Location: [facility];  Service: Gastroenterology;  Laterality: N/A;  . 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"  . HAND SURGERY Bilateral 2001  . POLYPECTOMY  2000  . POLYPECTOMY N/A 06/12/2017   Procedure: POLYPECTOMY;  Surgeon: Lucilla Lame, MD;  Location: [facility];  Service: Gastroenterology;  Laterality: N/A;  . rigger finger      Social History   Tobacco Use  . Smoking status: Former Smoker    Packs/day: 0.25    Years: 30.00    Pack years: 7.50    Types: Cigarettes    Quit date: 03/25/2020    Years since quitting: 0.3  . Smokeless tobacco: Never Used  Vaping Use  . Vaping Use: Every day  Substance Use Topics  . Alcohol use: Yes    Alcohol/week: 1.0 standard drink    Types: 1 Glasses of wine per week    Comment: occ  . Drug use: No     Medication list has been reviewed and updated.  Current Meds  Medication Sig  . Accu-Chek FastClix Lancets MISC USE TO TEST BLOOD SUGAR TWICE DAILY  . ACCU-CHEK GUIDE test strip USE TO TEST BLOOD SUGAR TWICE DAILY  . amLODipine (NORVASC) 5 MG tablet TAKE 1 TABLET BY MOUTH AT BEDTIME  . atorvastatin (LIPITOR) 40 MG tablet TAKE 1 TABLET BY MOUTH EVERY DAY  . Blood Glucose Monitoring Suppl (ACCU-CHEK GUIDE ME) w/Device KIT TO TEST TWICE DAILY  . Calcium Carbonate-Vitamin D 600-400 MG-UNIT tablet TAKE 1 TABLET BY MOUTH EVERY DAY  . chlorthalidone (HYGROTON) 25 MG tablet TAKE 1 TABLET BY MOUTH EVERY DAY  . DULoxetine (CYMBALTA) 60 MG capsule TAKE 1 CAPSULE(60 MG) BY MOUTH DAILY  . gabapentin (NEURONTIN) 300 MG capsule TAKE 2 CAPSULES(600 MG) BY MOUTH TWICE DAILY  . methocarbamol (ROBAXIN) 500 MG tablet TAKE 1 TABLET BY MOUTH FOUR TIMES DAILY  . metoprolol tartrate (LOPRESSOR) 50 MG tablet TAKE 1 TABLET BY MOUTH TWICE DAILY  . Multiple Minerals-Vitamins (CALCIUM & VIT D3 BONE HEALTH PO) Take by mouth. 600-400 mg  . nitroGLYCERIN (NITROSTAT) 0.4 MG SL tablet Place under the tongue.  . pantoprazole  (PROTONIX) 40 MG tablet Take 1 tablet (40 mg total) by mouth 2 (two) times daily before a meal.  . potassium chloride SA (KLOR-CON) 20 MEQ tablet TAKE 1 TABLET BY MOUTH EVERY DAY  . tamsulosin (FLOMAX) 0.4 MG CAPS capsule TAKE 1 CAPSULE BY MOUTH EVERY DAY  . traZODone (DESYREL) 50 MG tablet Take 1 tablet (50 mg total) by mouth at bedtime.  . VENTOLIN HFA 108 (90 Base) MCG/ACT inhaler INHALE 2 PUFFS INTO THE LUNGS EVERY 6 HOURS AS NEEDED FOR WHEEZING OR SHORTNESS OF BREATH    PHQ 2/9 Scores 06/21/2020 06/07/2020 03/09/2020 09/08/2019  PHQ - 2 Score _0 0  PHQ- 9 Score _1 GAD 7 : Generalized Anxiety Score 06/07/2020 03/09/2020  Nervous, Anxious, on Edge 2 3  Control/stop worrying 3 0  Worry too much - different things 3 0  Trouble relaxing 1 1  Restless 0 3  Easily annoyed or irritable 3 2  Afraid - awful might happen 0 2  Total GAD 7 Score 12 11  Anxiety Difficulty Not difficult at all Somewhat difficult    BP Readings from Last 3 Encounters:  08/15/20 104/68  08/04/20 132/74  07/14/20 124/76    Physical Exam Vitals and nursing note reviewed.  Constitutional:      General: She is not in acute distress.    Appearance: She is well-developed.  HENT:     Head: Normocephalic and atraumatic.  Cardiovascular:     Rate and Rhythm: Normal rate and regular rhythm.     Pulses: Normal pulses.     Heart sounds: No murmur heard.   Pulmonary:     Effort: Pulmonary effort is normal. No respiratory distress.     Breath sounds: No wheezing or rhonchi.  Abdominal:     General: There is no distension.     Palpations: Abdomen is soft.     Tenderness: There is abdominal tenderness in the epigastric area. There is no guarding or rebound.  Musculoskeletal:     Cervical back: Spasms and tenderness present. No rigidity. Decreased range of motion.     Right lower leg: No edema.     Left lower leg: No edema.  Skin:    General: Skin is warm and dry.     Findings: No rash.    Neurological:     General: No focal deficit present.     Mental Status: She is alert and oriented to person, place, and time.  Psychiatric:        Behavior: Behavior normal.        Thought Content: Thought content normal.     Wt Readings from Last 3 Encounters:  08/15/20 145 lb (65.8 kg)  08/01/20 145 lb 8.1 oz (66 kg)  07/13/20 145 lb (65.8 kg)    BP 104/68   Pulse 91   Ht _0  (1.549 m)   Wt 145 lb (65.8 kg)   SpO2 97%   BMI 27.40 kg/m   Assessment and Plan: 1. Acute gastric ulcer with perforation ([place]) Treated conservatively and improving Continue bid PPI Follow up with GI for EGD  2. Hypokalemia Normalized before discharge  3. Lumbar disc herniation with radiculopathy May need Ortho referral after GI issues stablize Avoid all nsaids; allergic to Tramadol and Vicodin Continue muscle relaxants and gabapentin   Partially dictated using Editor, commissioning. Any errors are unintentional.  Halina Maidens, MD [facility]  08/15/2020

## 2020-08-15 NOTE — Patient Instructions (Signed)
Kefir - yogurt drink for probiotics instead of pills

## 2020-08-18 ENCOUNTER — Other Ambulatory Visit
Admission: RE | Admit: 2020-08-18 | Discharge: 2020-08-18 | Disposition: A | Discharge status: Home / Self Care | Payer: Medicare Other | Source: Ambulatory Visit | Attending: Gastroenterology | Admitting: Gastroenterology

## 2020-08-18 ENCOUNTER — Other Ambulatory Visit: Payer: Self-pay

## 2020-08-18 DIAGNOSIS — Z01812 Encounter for preprocedural laboratory examination: Secondary | ICD-10-CM | POA: Insufficient documentation

## 2020-08-18 DIAGNOSIS — Z20822 Contact with and (suspected) exposure to covid-19: Secondary | ICD-10-CM | POA: Insufficient documentation

## 2020-08-19 LAB — SARS CORONAVIRUS 2 (TAT 6-24 HRS): SARS Coronavirus 2: NEGATIVE

## 2020-08-21 ENCOUNTER — Encounter: Payer: Self-pay | Admitting: Gastroenterology

## 2020-08-22 ENCOUNTER — Ambulatory Visit
Admission: RE | Admit: 2020-08-22 | Discharge: 2020-08-22 | Disposition: A | Discharge status: Home / Self Care | Payer: Medicare Other | Attending: Gastroenterology | Admitting: Gastroenterology

## 2020-08-22 ENCOUNTER — Other Ambulatory Visit: Payer: Self-pay

## 2020-08-22 ENCOUNTER — Ambulatory Visit: Payer: Medicare Other | Admitting: Certified Registered Nurse Anesthetist

## 2020-08-22 ENCOUNTER — Encounter
Admission: RE | Disposition: A | Discharge status: Home / Self Care | Payer: Self-pay | Source: Home / Self Care | Attending: Gastroenterology

## 2020-08-22 ENCOUNTER — Encounter: Payer: Self-pay | Admitting: Gastroenterology

## 2020-08-22 DIAGNOSIS — K219 Gastro-esophageal reflux disease without esophagitis: Secondary | ICD-10-CM | POA: Diagnosis not present

## 2020-08-22 DIAGNOSIS — F329 Major depressive disorder, single episode, unspecified: Secondary | ICD-10-CM | POA: Diagnosis not present

## 2020-08-22 DIAGNOSIS — Z9861 Coronary angioplasty status: Secondary | ICD-10-CM | POA: Diagnosis not present

## 2020-08-22 DIAGNOSIS — Z8249 Family history of ischemic heart disease and other diseases of the circulatory system: Secondary | ICD-10-CM | POA: Diagnosis not present

## 2020-08-22 DIAGNOSIS — Z79899 Other long term (current) drug therapy: Secondary | ICD-10-CM | POA: Insufficient documentation

## 2020-08-22 DIAGNOSIS — Z85038 Personal history of other malignant neoplasm of large intestine: Secondary | ICD-10-CM | POA: Insufficient documentation

## 2020-08-22 DIAGNOSIS — E785 Hyperlipidemia, unspecified: Secondary | ICD-10-CM | POA: Insufficient documentation

## 2020-08-22 DIAGNOSIS — R1013 Epigastric pain: Secondary | ICD-10-CM | POA: Insufficient documentation

## 2020-08-22 DIAGNOSIS — I2510 Atherosclerotic heart disease of native coronary artery without angina pectoris: Secondary | ICD-10-CM | POA: Insufficient documentation

## 2020-08-22 DIAGNOSIS — I10 Essential (primary) hypertension: Secondary | ICD-10-CM | POA: Insufficient documentation

## 2020-08-22 DIAGNOSIS — K255 Chronic or unspecified gastric ulcer with perforation: Secondary | ICD-10-CM

## 2020-08-22 DIAGNOSIS — K259 Gastric ulcer, unspecified as acute or chronic, without hemorrhage or perforation: Secondary | ICD-10-CM | POA: Insufficient documentation

## 2020-08-22 DIAGNOSIS — K279 Peptic ulcer, site unspecified, unspecified as acute or chronic, without hemorrhage or perforation: Secondary | ICD-10-CM

## 2020-08-22 DIAGNOSIS — R1011 Right upper quadrant pain: Secondary | ICD-10-CM | POA: Insufficient documentation

## 2020-08-22 DIAGNOSIS — R933 Abnormal findings on diagnostic imaging of other parts of digestive tract: Secondary | ICD-10-CM | POA: Insufficient documentation

## 2020-08-22 DIAGNOSIS — K2970 Gastritis, unspecified, without bleeding: Secondary | ICD-10-CM | POA: Insufficient documentation

## 2020-08-22 DIAGNOSIS — E119 Type 2 diabetes mellitus without complications: Secondary | ICD-10-CM | POA: Insufficient documentation

## 2020-08-22 DIAGNOSIS — Z87891 Personal history of nicotine dependence: Secondary | ICD-10-CM | POA: Insufficient documentation

## 2020-08-22 HISTORY — PX: ESOPHAGOGASTRODUODENOSCOPY (EGD) WITH PROPOFOL: SHX5813

## 2020-08-22 LAB — GLUCOSE, CAPILLARY: Glucose-Capillary: 104 mg/dL — ABNORMAL HIGH (ref 70–99)

## 2020-08-22 SURGERY — ESOPHAGOGASTRODUODENOSCOPY (EGD) WITH PROPOFOL
Anesthesia: General

## 2020-08-22 MED ORDER — PROPOFOL 500 MG/50ML IV EMUL
INTRAVENOUS | Status: DC | PRN
  Administered 2020-08-22: 150 ug/kg/min via INTRAVENOUS

## 2020-08-22 MED ORDER — PROPOFOL 10 MG/ML IV BOLUS
INTRAVENOUS | Status: DC | PRN
  Administered 2020-08-22: 60 mg via INTRAVENOUS

## 2020-08-22 MED ORDER — SODIUM CHLORIDE 0.9 % IV SOLN: INTRAVENOUS | Status: DC

## 2020-08-22 MED ORDER — LIDOCAINE HCL (CARDIAC) PF 100 MG/5ML IV SOSY
PREFILLED_SYRINGE | INTRAVENOUS | Status: DC | PRN
  Administered 2020-08-22: 50 mg via INTRAVENOUS
  Administered 2020-08-22: 10 mg via INTRAVENOUS

## 2020-08-22 NOTE — H&P (Signed)
Betty Darby, MD [address]  [place]  [place], [place] [address]  Main: [phone]  Fax: [phone] Pager: [phone]  Primary Care Physician:  Glean Hess, MD Primary Gastroenterologist:  Dr. Cephas Vaughn  Pre-Procedure History & Physical: HPI:  Betty Vaughn is a 69 y.o. female is here for an endoscopy.   Past Medical History:  Diagnosis Date  . Allergy   . Arthritis   . Chronic insomnia   . Colon cancer ([place])    polyp was cancerous  . Depression   . Diabetes mellitus without complication ([place])   . GERD (gastroesophageal reflux disease)   . Hx of colonic polyps   . Hyperlipidemia   . Hypertension   . Neuromuscular disorder ([place])   . Psoriasis    scalp    Past Surgical History:  Procedure Laterality Date  . ABDOMINAL HYSTERECTOMY     total  . CERVICAL FUSION  2005  . CERVICAL FUSION  01/2018   C2-T2 posterior laminectomy and fusion; removal of previous hardware  . CHOLECYSTECTOMY    . COLONOSCOPY WITH PROPOFOL N/A 06/12/2017   Procedure: COLONOSCOPY WITH PROPOFOL;  Surgeon: Lucilla Lame, MD;  Location: [facility];  Service: Gastroenterology;  Laterality: N/A;  . 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"  . HAND SURGERY Bilateral 2001  . POLYPECTOMY  2000  . POLYPECTOMY N/A 06/12/2017   Procedure: POLYPECTOMY;  Surgeon: Lucilla Lame, MD;  Location: [facility];  Service: Gastroenterology;  Laterality: N/A;  . rigger finger      Prior to Admission medications   Medication Sig Start Date End Date Taking? Authorizing Provider  amLODipine (NORVASC) 5 MG tablet TAKE 1 TABLET BY MOUTH AT BEDTIME 04/26/20  Yes Glean Hess, MD  atorvastatin (LIPITOR) 40 MG tablet TAKE 1 TABLET BY MOUTH EVERY DAY 04/26/20  Yes Glean Hess, MD  chlorthalidone (HYGROTON) 25 MG tablet TAKE 1 TABLET BY MOUTH EVERY DAY 04/26/20  Yes Glean Hess, MD  DULoxetine (CYMBALTA) 60 MG capsule TAKE 1 CAPSULE(60 MG) BY MOUTH DAILY 03/27/20   Yes Glean Hess, MD  gabapentin (NEURONTIN) 300 MG capsule TAKE 2 CAPSULES(600 MG) BY MOUTH TWICE DAILY 06/25/20  Yes Glean Hess, MD  methocarbamol (ROBAXIN) 500 MG tablet TAKE 1 TABLET BY MOUTH FOUR TIMES DAILY 02/26/20  Yes Glean Hess, MD  metoprolol tartrate (LOPRESSOR) 50 MG tablet TAKE 1 TABLET BY MOUTH TWICE DAILY 10/25/19  Yes Glean Hess, MD  pantoprazole (PROTONIX) 40 MG tablet Take 1 tablet (40 mg total) by mouth 2 (two) times daily before a meal. 08/04/20 09/03/20 Yes Wyvonnia Dusky, MD  tamsulosin (FLOMAX) 0.4 MG CAPS capsule TAKE 1 CAPSULE BY MOUTH EVERY DAY 07/28/19  Yes Glean Hess, MD  traZODone (DESYREL) 50 MG tablet Take 1 tablet (50 mg total) by mouth at bedtime. 03/09/20  Yes Glean Hess, MD  Accu-Chek FastClix Lancets MISC USE TO TEST BLOOD SUGAR TWICE DAILY 06/19/19   Glean Hess, MD  ACCU-CHEK GUIDE test strip USE TO TEST BLOOD SUGAR TWICE DAILY 06/19/19   Glean Hess, MD  Blood Glucose Monitoring Suppl (ACCU-CHEK GUIDE ME) w/Device KIT TO TEST TWICE DAILY 06/19/19   Glean Hess, MD  Calcium Carbonate-Vitamin D 600-400 MG-UNIT tablet TAKE 1 TABLET BY MOUTH EVERY DAY 09/25/19   Glean Hess, MD  Multiple Minerals-Vitamins (CALCIUM & VIT D3 BONE HEALTH PO) Take by mouth. 600-400 mg    [provider]  nitroGLYCERIN (NITROSTAT) 0.4 MG SL tablet Place under the tongue.    [provider]  potassium chloride SA (KLOR-CON) 20 MEQ tablet TAKE 1 TABLET BY MOUTH EVERY DAY 04/26/20   Glean Hess, MD  VENTOLIN HFA 108 [phone] Base) MCG/ACT inhaler INHALE 2 PUFFS INTO THE LUNGS EVERY 6 HOURS AS NEEDED FOR WHEEZING OR SHORTNESS OF BREATH Patient not taking: Reported on 08/22/2020 04/26/19   Glean Hess, MD    Allergies as of 08/08/2020 - Review Complete 08/07/2020  Allergen Reaction Noted  . Azithromycin Swelling 11/10/2016  . Propoxyphene Anaphylaxis 05/13/2015  . Tramadol Anaphylaxis 05/13/2015  .  Penicillins Swelling 05/13/2015  . Piroxicam Rash 05/13/2015  . Sertraline hcl Rash 05/13/2015    Family History  Problem Relation Age of Onset  . Breast cancer Mother 8  . Diabetes Mother   . CAD Father   . Heart attack Father   . Diabetes Daughter   . Kidney failure Daughter   . Breast cancer Sister 24  . Sickle cell anemia Other   . Cancer Brother        lung  . Heart attack Sister   . Cancer Sister        ovarian  . Kidney cancer Neg Hx   . Bladder Cancer Neg Hx     Social History   Socioeconomic History  . Marital status: Divorced    Spouse name: Not on file  . Number of children: 3  . Years of education: Not on file  . Highest education level: 12th grade  Occupational History  . Occupation: Retired  Tobacco Use  . Smoking status: Former Smoker    Packs/day: 0.25    Years: 30.00    Pack years: 7.50    Types: Cigarettes    Quit date: 03/25/2020    Years since quitting: 0.4  . Smokeless tobacco: Never Used  Vaping Use  . Vaping Use: Every day  Substance and Sexual Activity  . Alcohol use: Yes    Alcohol/week: 1.0 standard drink    Types: 1 Glasses of wine per week    Comment: occ  . Drug use: No  . Sexual activity: Not Currently  Other Topics Concern  . Not on file  Social History Narrative  . Not on file   Social Determinants of Health   Financial Resource Strain: High Risk  . Difficulty of Paying Living Expenses: Hard  Food Insecurity: Food Insecurity Present  . Worried About Charity fundraiser in the Last Year: Sometimes true  . Ran Out of Food in the Last Year: Sometimes true  Transportation Needs: No Transportation Needs  . Lack of Transportation (Medical): No  . Lack of Transportation (Non-Medical): No  Physical Activity: Insufficiently Active  . Days of Exercise per Week: 3 days  . Minutes of Exercise per Session: 20 min  Stress: No Stress Concern Present  . Feeling of Stress : Only a little  Social Connections: Moderately Isolated  .  Frequency of Communication with Friends and Family: More than three times a week  . Frequency of Social Gatherings with Friends and Family: Once a week  . Attends Religious Services: More than 4 times per year  . Active Member of Clubs or Organizations: No  . Attends Archivist Meetings: Never  . Marital Status: Divorced  Human resources officer Violence: Not At Risk  . Fear of Current or Ex-Partner: No  . Emotionally Abused: No  . Physically Abused: No  . Sexually Abused:  No    Review of Systems: See HPI, otherwise negative ROS  Physical Exam: BP 107/68   Pulse 75   Temp (!) 96.7 F (35.9 C) (Temporal)   Resp 20   Ht 5' 1"  (1.549 m)   Wt 65.8 kg   LMP  (LMP Unknown)   SpO2 100%   BMI 27.40 kg/m  General:   Alert,  pleasant and cooperative in NAD Head:  Normocephalic and atraumatic. Neck:  Supple; no masses or thyromegaly. Lungs:  Clear throughout to auscultation.    Heart:  Regular rate and rhythm. Abdomen:  Soft, nontender and nondistended. Normal bowel sounds, without guarding, and without rebound.   Neurologic:  Alert and  oriented x4;  grossly normal neurologically.  Impression/Plan: Crosby Bevan is here for an endoscopy to be performed for follow up of PUD, abnormal CT  Risks, benefits, limitations, and alternatives regarding  endoscopy have been reviewed with the patient.  Questions have been answered.  All parties agreeable.   Sherri Sear, MD  08/22/2020, 8:15 AM

## 2020-08-22 NOTE — Transfer of Care (Signed)
Immediate Anesthesia Transfer of Care Note  Patient: Betty Vaughn  Procedure(s) Performed: ESOPHAGOGASTRODUODENOSCOPY (EGD) WITH PROPOFOL (N/A )  Patient Location: PACU  Anesthesia Type:General  Level of Consciousness: awake, alert  and oriented  Airway & Oxygen Therapy: Patient Spontanous Breathing and Patient connected to face mask oxygen  Post-op Assessment: Report given to RN and Post -op Vital signs reviewed and stable  Post vital signs: Reviewed and stable  Last Vitals:  Vitals Value Taken Time  BP 108/77 08/22/20 0845  Temp    Pulse 90 08/22/20 0845  Resp 18 08/22/20 0845  SpO2 100 % 08/22/20 0845  Vitals shown include unvalidated device data.  Last Pain:  Vitals:   08/22/20 0845  TempSrc:   PainSc: 0-No pain         Complications: No complications documented.

## 2020-08-22 NOTE — Progress Notes (Signed)
°   08/22/20 0741  Clinical Encounter Type  Visited With Family  Visit Type Initial  Referral From Chaplain  Consult/Referral To [facility] (For Healthcare)  Does Patient Have a Medical Advance Directive? No  While rounding SDS waiting area, chaplain briefly spoke to pt and her daughter. Chaplain asked if either of them had questions or concern and they said no. As chaplain was talking to them, the nurse came out for the pt. Pt's daughter said she was going to get a cup of coffee. Chaplain told her to have a terrific Tuesday and left.

## 2020-08-22 NOTE — Op Note (Signed)
[facility] Patient Name: Betty Vaughn Procedure Date: 08/22/2020 7:46 AM MRN: [id number] Account #: [id number] Date of Birth: 1951/03/02 Admit Type: Outpatient Age: 69 Room: [facility] ENDO ROOM 1 Gender: Female Note Status: Finalized Procedure:             Upper GI endoscopy Indications:           Epigastric abdominal pain, Abdominal pain in the right                         upper quadrant, Follow-up of peptic ulcer, Abnormal CT                         of the GI tract Providers:             Lin Landsman MD, MD Referring MD:          Halina Maidens, MD (Referring MD) Medicines:             Monitored Anesthesia Care Complications:         No immediate complications. Estimated blood loss: None. Procedure:             Pre-Anesthesia Assessment:                        - Prior to the procedure, a History and Physical was                         performed, and patient medications and allergies were                         reviewed. The patient is competent. The risks and                         benefits of the procedure and the sedation options and                         risks were discussed with the patient. All questions                         were answered and informed consent was obtained.                         Patient identification and proposed procedure were                         verified by the physician, the nurse, the                         anesthesiologist, the anesthetist and the technician                         in the pre-procedure area in the procedure room in the                         endoscopy suite. Mental Status Examination: alert and                         oriented. Airway Examination: normal oropharyngeal  airway and neck mobility. Respiratory Examination:                         clear to auscultation. CV Examination: normal.                         Prophylactic Antibiotics: The patient does  not require                         prophylactic antibiotics. Prior Anticoagulants: The                         patient has taken no previous anticoagulant or                         antiplatelet agents. ASA Grade Assessment: III - A                         patient with severe systemic disease. After reviewing                         the risks and benefits, the patient was deemed in                         satisfactory condition to undergo the procedure. The                         anesthesia plan was to use monitored anesthesia care                         (MAC). Immediately prior to administration of                         medications, the patient was re-assessed for adequacy                         to receive sedatives. The heart rate, respiratory                         rate, oxygen saturations, blood pressure, adequacy of                         pulmonary ventilation, and response to care were                         monitored throughout the procedure. The physical                         status of the patient was re-assessed after the                         procedure.                        After obtaining informed consent, the endoscope was                         passed under direct vision. Throughout the procedure,  the patient's blood pressure, pulse, and oxygen                         saturations were monitored continuously. The Endoscope                         was introduced through the mouth, and advanced to the                         second part of duodenum. The upper GI endoscopy was                         accomplished without difficulty. The patient tolerated                         the procedure well. Findings:      The duodenal bulb and second portion of the duodenum were normal.      One non-bleeding cratered gastric ulcer with a clean ulcer base ([place]       Class III) was found in the prepyloric region of the stomach. The lesion       was  20 mm in largest dimension.      Diffuse mildly erythematous mucosa without bleeding was found in the       gastric antrum. Biopsies were taken with a cold forceps for histology.      The gastric body and incisura were normal. Biopsies were taken with a       cold forceps for Helicobacter pylori testing.      The cardia and gastric fundus were normal on retroflexion.      The gastroesophageal junction and examined esophagus were normal. Impression:            - Normal duodenal bulb and second portion of the                         duodenum.                        - Non-bleeding gastric ulcer with a clean ulcer base                         (Forrest Class III).                        - Erythematous mucosa in the antrum. Biopsied.                        - Normal gastric body and incisura. Biopsied.                        - Normal gastroesophageal junction and esophagus. Recommendation:        - Discharge patient to home (with escort).                        - Resume previous diet today.                        - Continue present medications.                        -  Await pathology results.                        - Repeat upper endoscopy in 3 months to check healing.                        - Use Protonix (pantoprazole) 40 mg PO BID for 3                         months. Procedure Code(s):     --- Professional ---                        [phone], Esophagogastroduodenoscopy, flexible,                         transoral; with biopsy, single or multiple Diagnosis Code(s):     --- Professional ---                        K25.9, Gastric ulcer, unspecified as acute or chronic,                         without hemorrhage or perforation                        K31.89, Other diseases of stomach and duodenum                        R10.13, Epigastric pain                        R10.11, Right upper quadrant pain                        K27.9, Peptic ulcer, site unspecified, unspecified as                          acute or chronic, without hemorrhage or perforation                        R93.3, Abnormal findings on diagnostic imaging of                         other parts of digestive tract CPT copyright 2019 American Medical Association. All rights reserved. The codes documented in this report are preliminary and upon coder review may  be revised to meet current compliance requirements. Dr. Ulyess Mort Lin Landsman MD, MD 08/22/2020 8:40:33 AM This report has been signed electronically. Number of Addenda: 0 Note Initiated On: 08/22/2020 7:46 AM Estimated Blood Loss:  Estimated blood loss: none.      [facility]

## 2020-08-22 NOTE — Anesthesia Preprocedure Evaluation (Signed)
Anesthesia Evaluation  Patient identified by MRN, date of birth, ID band Patient awake    Reviewed: Allergy & Precautions, H&P , NPO status , Patient's Chart, lab work & pertinent test results, reviewed documented beta blocker date and time   Airway Mallampati: II   Neck ROM: full    Dental  (+) Poor Dentition   Pulmonary neg pulmonary ROS, former smoker,    Pulmonary exam normal        Cardiovascular Exercise Tolerance: Good hypertension, On Medications (-) angina+ CAD  Normal cardiovascular exam Rhythm:regular Rate:Normal     Neuro/Psych  Headaches, PSYCHIATRIC DISORDERS Depression  Neuromuscular disease    GI/Hepatic Neg liver ROS, PUD, GERD  Medicated,  Endo/Other  negative endocrine ROSdiabetes  Renal/GU Renal disease  negative genitourinary   Musculoskeletal   Abdominal   Peds  Hematology negative hematology ROS (+)   Anesthesia Other Findings Past Medical History: No date: Allergy No date: Arthritis No date: Chronic insomnia No date: Colon cancer ([facility])     Comment:  polyp was cancerous No date: Depression No date: Diabetes mellitus without complication ([facility]) No date: GERD (gastroesophageal reflux disease) No date: Hx of colonic polyps No date: Hyperlipidemia No date: Hypertension No date: Neuromuscular disorder ([place]) No date: Psoriasis     Comment:  scalp Past Surgical History: No date: ABDOMINAL HYSTERECTOMY     Comment:  total 2005: CERVICAL FUSION 01/2018: CERVICAL FUSION     Comment:  C2-T2 posterior laminectomy and fusion; removal of               previous hardware No date: CHOLECYSTECTOMY 06/12/2017: COLONOSCOPY WITH PROPOFOL; N/A     Comment:  Procedure: COLONOSCOPY WITH PROPOFOL;  Surgeon: Lucilla Lame, MD;  Location: [place];  Service:               Gastroenterology;  Laterality: N/A; 2011: CORONARY ANGIOPLASTY     Comment:  "insignifiant CAD" 2001: HAND  SURGERY; Bilateral 2000: POLYPECTOMY 06/12/2017: POLYPECTOMY; N/A     Comment:  Procedure: POLYPECTOMY;  Surgeon: Lucilla Lame, MD;                Location: [place];  Service:               Gastroenterology;  Laterality: N/A; No date: rigger finger BMI    Body Mass Index: 27.40 kg/m     Reproductive/Obstetrics negative OB ROS                             Anesthesia Physical Anesthesia Plan  ASA: III  Anesthesia Plan: General   Post-op Pain Management:    Induction:   PONV Risk Score and Plan:   Airway Management Planned:   Additional Equipment:   Intra-op Plan:   Post-operative Plan:   Informed Consent: I have reviewed the patients History and Physical, chart, labs and discussed the procedure including the risks, benefits and alternatives for the proposed anesthesia with the patient or authorized representative who has indicated his/her understanding and acceptance.     Dental Advisory Given  Plan Discussed with: CRNA  Anesthesia Plan Comments:         Anesthesia Quick Evaluation

## 2020-08-23 ENCOUNTER — Encounter: Payer: Self-pay | Admitting: Gastroenterology

## 2020-08-23 LAB — SURGICAL PATHOLOGY

## 2020-08-24 ENCOUNTER — Other Ambulatory Visit: Payer: Self-pay | Admitting: Internal Medicine

## 2020-08-24 DIAGNOSIS — F5104 Psychophysiologic insomnia: Secondary | ICD-10-CM

## 2020-08-24 NOTE — Telephone Encounter (Signed)
Requested medication (s) are due for refill today: yes  Requested medication (s) are on the active medication list: yes  Last refill: 06/27/20  Future visit scheduled: yes  Notes to clinic: not delegated    Requested Prescriptions  Pending Prescriptions Disp Refills   methocarbamol (ROBAXIN) 500 MG tablet [Pharmacy Med Name: METHOCARBAMOL 500MG  TABLETS] 120 tablet 5    Sig: TAKE 1 TABLET BY MOUTH FOUR TIMES DAILY      Not Delegated - Analgesics:  Muscle Relaxants Failed - 08/24/2020  6:28 AM      Failed - This refill cannot be delegated      Passed - Valid encounter within last 6 months    Recent Outpatient Visits           1 week ago Acute gastric ulcer with perforation [facility])   [facility] Glean Hess, MD   2 months ago Primary osteoarthritis of right knee   [facility] Glean Hess, MD   5 months ago Glucose intolerance (impaired glucose tolerance)   [facility] Glean Hess, MD   11 months ago Annual physical exam   [facility] Glean Hess, MD   11 months ago Myofascial muscle pain   [facility] Glean Hess, MD       Future Appointments             In 1 month Glean Hess, MD [facility]             Signed Prescriptions Disp Refills   traZODone (DESYREL) 50 MG tablet 90 tablet 1    Sig: TAKE 1 TABLET(50 MG) BY MOUTH AT BEDTIME      Psychiatry: Antidepressants - Serotonin Modulator Passed - 08/24/2020  6:28 AM      Passed - Completed PHQ-2 or PHQ-9 in the last 360 days.      Passed - Valid encounter within last 6 months    Recent Outpatient Visits           1 week ago Acute gastric ulcer with perforation [facility])   [facility] Glean Hess, MD   2 months ago Primary osteoarthritis of right knee   [facility] Glean Hess, MD   5 months ago Glucose intolerance (impaired glucose tolerance)   [facility] Glean Hess,  MD   11 months ago Annual physical exam   [facility] Glean Hess, MD   11 months ago Myofascial muscle pain   [facility] Glean Hess, MD       Future Appointments             In 1 month [name] [name], MD [facility], [facility]

## 2020-08-24 NOTE — Telephone Encounter (Signed)
Requested medication (s) are due for refill today - yes  Requested medication (s) are on the active medication list -yes  Future visit scheduled -yes  Last refill: 04/13/20  Notes to clinic: Request for OTC rx- not on protocol  Requested Prescriptions  Pending Prescriptions Disp Refills   Calcium Carbonate-Vitamin D3 600-400 MG-UNIT TABS [Pharmacy Med Name: CALCIUM 600+D (400U) TABLETS] 30 tablet     Sig: TAKE [place] DAY      Endocrinology: Minerals - Calcium Supplementation with Vitamin D Failed - 08/24/2020  3:29 PM      Failed - Phosphate in normal range and within 360 days    No results found for: PHOS        Failed - Ca in normal range and within 360 days    Calcium  Date Value Ref Range Status  08/04/2020 8.8 (L) 8.9 - 10.3 mg/dL Final   Calcium, Total  Date Value Ref Range Status  07/06/2013 9.1 8.5 - 10.1 mg/dL Final          Failed - Vitamin D in normal range and within 360 days    No results found for: [id number], [id number], [id number], 25OHVITD3, 25OHVITD2, [place], 25OHVITD2, 25OHVITD1, 25OHVITD2, 25OHVITD3, VD25OH        Passed - Valid encounter within last 12 months    Recent Outpatient Visits           1 week ago Acute gastric ulcer with perforation [facility])   [facility] Glean Hess, MD   2 months ago Primary osteoarthritis of right knee   [facility] Glean Hess, MD   5 months ago Glucose intolerance (impaired glucose tolerance)   [facility] Glean Hess, MD   11 months ago Annual physical exam   [facility] Glean Hess, MD   11 months ago Myofascial muscle pain   [facility] Glean Hess, MD       Future Appointments             In 1 month [name], [name], MD [facility], [facility]                Requested Prescriptions  Pending Prescriptions Disp Refills   Calcium Carbonate-Vitamin D3 600-400 MG-UNIT TABS [Pharmacy Med Name: CALCIUM  600+D (400U) TABLETS] 30 tablet     Sig: TAKE [place] DAY      Endocrinology: Minerals - Calcium Supplementation with Vitamin D Failed - 08/24/2020  3:29 PM      Failed - Phosphate in normal range and within 360 days    No results found for: PHOS        Failed - Ca in normal range and within 360 days    Calcium  Date Value Ref Range Status  08/04/2020 8.8 (L) 8.9 - 10.3 mg/dL Final   Calcium, Total  Date Value Ref Range Status  07/06/2013 9.1 8.5 - 10.1 mg/dL Final          Failed - Vitamin D in normal range and within 360 days    No results found for: [id number], [id number], [id number], 25OHVITD3, 25OHVITD2, 25OHVITD3, 25OHVITD2, 25OHVITD1, 25OHVITD2, 25OHVITD3, VD25OH        Passed - Valid encounter within last 12 months    Recent Outpatient Visits           1 week ago Acute gastric ulcer with perforation [facility])   [facility] Glean Hess, MD  2 months ago Primary osteoarthritis of right knee   [facility] Glean Hess, MD   5 months ago Glucose intolerance (impaired glucose tolerance)   [facility] Glean Hess, MD   11 months ago Annual physical exam   [facility] Glean Hess, MD   11 months ago Myofascial muscle pain   [facility] Glean Hess, MD       Future Appointments             In 1 month [name], [name], MD [facility], [facility]

## 2020-08-24 NOTE — Telephone Encounter (Signed)
Requested Prescriptions  Pending Prescriptions Disp Refills   methocarbamol (ROBAXIN) 500 MG tablet [Pharmacy Med Name: METHOCARBAMOL 500MG  TABLETS] 120 tablet 5    Sig: TAKE 1 TABLET BY MOUTH FOUR TIMES DAILY     Not Delegated - Analgesics:  Muscle Relaxants Failed - 08/24/2020  6:28 AM      Failed - This refill cannot be delegated      Passed - Valid encounter within last 6 months    Recent Outpatient Visits          1 week ago Acute gastric ulcer with perforation [facility])   [facility] Glean Hess, MD   2 months ago Primary osteoarthritis of right knee   [facility] Glean Hess, MD   5 months ago Glucose intolerance (impaired glucose tolerance)   [facility] Glean Hess, MD   11 months ago Annual physical exam   [facility] Glean Hess, MD   11 months ago Myofascial muscle pain   [facility] Glean Hess, MD      Future Appointments            In 1 month Betty Melia [name], MD [facility]            traZODone (DESYREL) 50 MG tablet [Pharmacy Med Name: TRAZODONE 50MG  TABLETS] 90 tablet 1    Sig: TAKE 1 TABLET(50 MG) BY MOUTH AT BEDTIME     Psychiatry: Antidepressants - Serotonin Modulator Passed - 08/24/2020  6:28 AM      Passed - Completed PHQ-2 or PHQ-9 in the last 360 days.      Passed - Valid encounter within last 6 months    Recent Outpatient Visits          1 week ago Acute gastric ulcer with perforation [facility])   [facility] Glean Hess, MD   2 months ago Primary osteoarthritis of right knee   [facility] Glean Hess, MD   5 months ago Glucose intolerance (impaired glucose tolerance)   [facility] Glean Hess, MD   11 months ago Annual physical exam   [facility] Glean Hess, MD   11 months ago Myofascial muscle pain   [facility] Glean Hess, MD      Future Appointments            In 1  month Betty Melia [name], MD [facility], [facility]

## 2020-08-27 NOTE — Anesthesia Postprocedure Evaluation (Signed)
Anesthesia Post Note  Patient: Betty Vaughn  Procedure(s) Performed: ESOPHAGOGASTRODUODENOSCOPY (EGD) WITH PROPOFOL (N/A )  Patient location during evaluation: PACU Anesthesia Type: General Level of consciousness: awake and alert Pain management: pain level controlled Vital Signs Assessment: post-procedure vital signs reviewed and stable Respiratory status: spontaneous breathing, nonlabored ventilation, respiratory function stable and patient connected to nasal cannula oxygen Cardiovascular status: blood pressure returned to baseline and stable Postop Assessment: no apparent nausea or vomiting Anesthetic complications: no   No complications documented.   Last Vitals:  Vitals:   08/22/20 0855 08/22/20 0904  BP: 115/78 111/80  Pulse: 79 70  Resp: (!) 26 17  Temp:    SpO2: 100% 100%    Last Pain:  Vitals:   08/22/20 0904  TempSrc:   PainSc: 0-No pain                 Molli Barrows

## 2020-09-11 ENCOUNTER — Telehealth: Payer: Self-pay

## 2020-09-11 NOTE — Telephone Encounter (Signed)
[facility] sent [place] a refill request for Pantoprazole 40mg . Patient was seen in the hospital but has no follow up appointment scheduled with our office

## 2020-09-23 ENCOUNTER — Other Ambulatory Visit: Payer: Self-pay | Admitting: Internal Medicine

## 2020-09-23 NOTE — Telephone Encounter (Signed)
Requested medications are due for refill today yes  Requested medications are on the active medication list yes  Last refill 9/30  Last visit 02/2020  Future visit scheduled 09/2020  Notes to clinic Not delegated.

## 2020-09-25 ENCOUNTER — Ambulatory Visit
Admission: RE | Admit: 2020-09-25 | Discharge: 2020-09-25 | Disposition: A | Discharge status: Home / Self Care | Payer: Medicare Other | Source: Ambulatory Visit | Attending: Internal Medicine | Admitting: Internal Medicine

## 2020-09-25 ENCOUNTER — Other Ambulatory Visit: Payer: Self-pay

## 2020-09-25 DIAGNOSIS — M8588 Other specified disorders of bone density and structure, other site: Secondary | ICD-10-CM | POA: Diagnosis not present

## 2020-09-25 DIAGNOSIS — Z13820 Encounter for screening for osteoporosis: Secondary | ICD-10-CM | POA: Insufficient documentation

## 2020-09-25 DIAGNOSIS — Z1231 Encounter for screening mammogram for malignant neoplasm of breast: Secondary | ICD-10-CM

## 2020-09-25 DIAGNOSIS — Z780 Asymptomatic menopausal state: Secondary | ICD-10-CM | POA: Diagnosis not present

## 2020-09-25 DIAGNOSIS — F17200 Nicotine dependence, unspecified, uncomplicated: Secondary | ICD-10-CM | POA: Insufficient documentation

## 2020-09-27 ENCOUNTER — Telehealth: Payer: Self-pay

## 2020-09-27 NOTE — Telephone Encounter (Signed)
Wanted you to be aware of this message ! Thank you so much.   KP

## 2020-09-27 NOTE — Telephone Encounter (Unsigned)
Copied from [place] [id number]. Topic: Referral - Status >> Sep 27, 2020  3:14 PM Alanda Slim E wrote: Reason for CRM: Pt returned Langley Gauss call/ Pt no longer needs PT referral/ please advise

## 2020-09-29 ENCOUNTER — Encounter: Payer: Medicare HMO | Admitting: Internal Medicine

## 2020-10-02 ENCOUNTER — Other Ambulatory Visit: Payer: Self-pay

## 2020-10-04 ENCOUNTER — Other Ambulatory Visit: Payer: Self-pay

## 2020-10-04 ENCOUNTER — Ambulatory Visit (INDEPENDENT_AMBULATORY_CARE_PROVIDER_SITE_OTHER): Payer: Medicare Other | Admitting: Gastroenterology

## 2020-10-04 ENCOUNTER — Encounter: Payer: Self-pay | Admitting: Gastroenterology

## 2020-10-04 VITALS — BP 123/73 | HR 78 | Temp 98.7°F | Ht 61.0 in | Wt 144.1 lb

## 2020-10-04 DIAGNOSIS — N3289 Other specified disorders of bladder: Secondary | ICD-10-CM | POA: Diagnosis not present

## 2020-10-04 DIAGNOSIS — K251 Acute gastric ulcer with perforation: Secondary | ICD-10-CM

## 2020-10-04 DIAGNOSIS — K5909 Other constipation: Secondary | ICD-10-CM | POA: Diagnosis not present

## 2020-10-04 MED ORDER — PANTOPRAZOLE SODIUM 40 MG PO TBEC: 40.0000 mg | DELAYED_RELEASE_TABLET | Freq: Two times a day (BID) | ORAL | 1 refills | Status: DC

## 2020-10-04 NOTE — Progress Notes (Signed)
Cephas Darby, MD [address]  [place]  [place], [place] [address]  Main: [phone]  Fax: [phone]    Gastroenterology Consultation  Referring Provider:     Glean Hess, MD Primary Care Physician:  Glean Hess, MD Primary Gastroenterologist:  Dr. Cephas Darby Reason for Consultation:     Peptic ulcer disease, chronic constipation        HPI:   Betty Vaughn is a 69 y.o. female referred by Dr. [name], [name], MD  for consultation & management of peptic ulcer disease.  Patient was admitted to [facility] in early September 2021 secondary to abdominal pain, found to have gastric ulcer with impending perforation on the CT scan.  Patient was treated conservatively with antibiotics and acid suppressive therapy.  Inpatient upper endoscopy was deferred at that time.  Subsequently, patient underwent upper endoscopy on 08/22/2020, 3 weeks later as outpatient.  She reported significant improvement in the pain since discharge.  Found to have cratered, clean-based gastric ulcer.  There was no evidence of H. pylori.  The etiology of ulcer was thought to be secondary to chronic NSAID use.  Patient does have mild anemia, without any iron deficiency.  She ran out of Protonix 40 mg about a month ago.  She said she called our office several times for refills.  Patient is also going through a lot of stress lately as her ex-husband passed away.  She does report upper abdominal discomfort, abdominal bloating as well as irregular bowel habits.  She does have history of chronic constipation, since her childhood.  She reports that she has been eating more fiber, water.  She takes Senokot and laxative as needed which does not help anymore.  She said, MiraLAX did not help her in the past when she was severely constipated after her spine surgery.  She had history of tobacco use, 7.5 pack years, quit approximately in summer 2021, restarted smoking due to family stress.  Denies heavy alcohol  use.  NSAIDs: Mobic daily, stopped since diagnosis of peptic ulcer disease  Antiplts/Anticoagulants/Anti thrombotics: Aspirin 81 mg  GI Procedures:  Colonoscopy 06/12/2017 by Dr. Allen Norris - One 10 mm polyp in the rectum, removed with a cold snare. Resected and retrieved. - Two 2 to 3 mm polyps in the rectum, removed with a cold biopsy forceps. Resected and retrieved. - Non-bleeding internal hemorrhoids. Pathology showed tubular adenoma  Upper endoscopy 08/22/2020 - Normal duodenal bulb and second portion of the duodenum. - Non-bleeding gastric ulcer with a clean ulcer base ([place] Class III). - Erythematous mucosa in the antrum. Biopsied. - Normal gastric body and incisura. Biopsied. - Normal gastroesophageal junction and esophagus.  DIAGNOSIS:  A. STOMACH ULCER; COLD BIOPSY:  - REACTIVE GASTRITIS CONSISTENT WITH PATIENT'S HISTORY OF PEPTIC ULCER  DISEASE.  - NEGATIVE FOR H. PYLORI, DYSPLASIA, AND MALIGNANCY.    Past Medical History:  Diagnosis Date   Allergy    Arthritis    Chronic insomnia    Colon cancer ([place])    polyp was cancerous   Depression    Diabetes mellitus without complication (HCC)    GERD (gastroesophageal reflux disease)    Hx of colonic polyps    Hyperlipidemia    Hypertension    Neuromuscular disorder ([place])    Psoriasis    scalp    Past Surgical History:  Procedure Laterality Date   ABDOMINAL HYSTERECTOMY     total   CERVICAL FUSION  2005   CERVICAL FUSION  01/2018  C2-T2 posterior laminectomy and fusion; removal of previous hardware   CHOLECYSTECTOMY     COLONOSCOPY WITH PROPOFOL N/A 06/12/2017   Procedure: COLONOSCOPY WITH PROPOFOL;  Surgeon: Lucilla Lame, MD;  Location: [place];  Service: Gastroenterology;  Laterality: N/A;   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"   ESOPHAGOGASTRODUODENOSCOPY (EGD) WITH PROPOFOL N/A 08/22/2020   Procedure: ESOPHAGOGASTRODUODENOSCOPY (EGD) WITH PROPOFOL;  Surgeon:  Lin Landsman, MD;  Location: [place];  Service: Gastroenterology;  Laterality: N/A;   HAND SURGERY Bilateral 2001   POLYPECTOMY  2000   POLYPECTOMY N/A 06/12/2017   Procedure: POLYPECTOMY;  Surgeon: Lucilla Lame, MD;  Location: [place];  Service: Gastroenterology;  Laterality: N/A;   rigger finger      Current Outpatient Medications:    Accu-Chek FastClix Lancets MISC, USE TO TEST BLOOD SUGAR TWICE DAILY, Disp: 102 each, Rfl: 5   ACCU-CHEK GUIDE test strip, USE TO TEST BLOOD SUGAR TWICE DAILY, Disp: 100 strip, Rfl: 5   amLODipine (NORVASC) 5 MG tablet, TAKE 1 TABLET BY MOUTH AT BEDTIME, Disp: 90 tablet, Rfl: 1   ASPIRIN LOW DOSE 81 MG EC tablet, Take 81 mg by mouth daily., Disp: , Rfl:    atorvastatin (LIPITOR) 40 MG tablet, TAKE 1 TABLET BY MOUTH EVERY DAY, Disp: 90 tablet, Rfl: 1   Blood Glucose Monitoring Suppl (ACCU-CHEK GUIDE ME) w/Device KIT, TO TEST TWICE DAILY, Disp: 1 kit, Rfl: 0   Calcium Carbonate-Vitamin D 600-400 MG-UNIT tablet, TAKE 1 TABLET BY MOUTH EVERY DAY, Disp: 30 tablet, Rfl: 5   chlorthalidone (HYGROTON) 25 MG tablet, TAKE 1 TABLET BY MOUTH EVERY DAY, Disp: 90 tablet, Rfl: 1   DULoxetine (CYMBALTA) 60 MG capsule, TAKE 1 CAPSULE(60 MG) BY MOUTH DAILY, Disp: 90 capsule, Rfl: 0   gabapentin (NEURONTIN) 300 MG capsule, TAKE 2 CAPSULES(600 MG) BY MOUTH TWICE DAILY, Disp: 360 capsule, Rfl: 1   methocarbamol (ROBAXIN) 500 MG tablet, TAKE 1 TABLET BY MOUTH FOUR TIMES DAILY, Disp: 120 tablet, Rfl: 0   metoprolol tartrate (LOPRESSOR) 50 MG tablet, TAKE 1 TABLET BY MOUTH TWICE DAILY, Disp: 180 tablet, Rfl: 3   Multiple Minerals-Vitamins (CALCIUM & VIT D3 BONE HEALTH PO), Take by mouth. 600-400 mg, Disp: , Rfl:    nitroGLYCERIN (NITROSTAT) 0.4 MG SL tablet, Place under the tongue., Disp: , Rfl:    potassium chloride SA (KLOR-CON) 20 MEQ tablet, TAKE 1 TABLET BY MOUTH EVERY DAY, Disp: 90 tablet, Rfl: 3   tamsulosin (FLOMAX) 0.4 MG CAPS  capsule, TAKE 1 CAPSULE BY MOUTH EVERY DAY, Disp: 90 capsule, Rfl: 1   traZODone (DESYREL) 50 MG tablet, TAKE 1 TABLET(50 MG) BY MOUTH AT BEDTIME, Disp: 90 tablet, Rfl: 1   VENTOLIN HFA 108 (90 Base) MCG/ACT inhaler, INHALE 2 PUFFS INTO THE LUNGS EVERY 6 HOURS AS NEEDED FOR WHEEZING OR SHORTNESS OF BREATH, Disp: 54 g, Rfl: 1   pantoprazole (PROTONIX) 40 MG tablet, Take 1 tablet (40 mg total) by mouth 2 (two) times daily before a meal., Disp: 180 tablet, Rfl: 1   Family History  Problem Relation Age of Onset   Breast cancer Mother 24   Diabetes Mother    CAD Father    Heart attack Father    Diabetes Daughter    Kidney failure Daughter    Breast cancer Sister 63   Sickle cell anemia Other    Cancer Brother        lung   Heart attack Sister    Cancer Sister  ovarian   Kidney cancer Neg Hx    Bladder Cancer Neg Hx      Social History   Tobacco Use   Smoking status: Current Every Day Smoker    Packs/day: 0.25    Years: 30.00    Pack years: 7.50    Types: Cigarettes    Last attempt to quit: 03/25/2020    Years since quitting: 0.5   Smokeless tobacco: Never Used  Vaping Use   Vaping Use: Every day  Substance Use Topics   Alcohol use: Yes    Alcohol/week: 1.0 standard drink    Types: 1 Glasses of wine per week    Comment: occ   Drug use: No    Allergies as of 10/04/2020 - Review Complete 10/04/2020  Allergen Reaction Noted   Azithromycin Swelling 11/10/2016   Propoxyphene Anaphylaxis 05/13/2015   Tramadol Anaphylaxis 05/13/2015   Penicillins Swelling 05/13/2015   Piroxicam Rash 05/13/2015   Sertraline hcl Rash 05/13/2015    Review of Systems:    All systems reviewed and negative except where noted in HPI.   Physical Exam:  BP 123/73 (BP Location: Left Arm, Patient Position: Sitting, Cuff Size: Normal)    Pulse 78    Temp 98.7 F (37.1 C) (Oral)    Ht 5' 1"  (1.549 m)    Wt 144 lb 2 oz (65.4 kg)    LMP  (LMP Unknown)    BMI 27.23 kg/m   No LMP recorded (lmp unknown). Patient has had a hysterectomy.  General:   Alert,  Well-developed, well-nourished, pleasant and cooperative in NAD Head:  Normocephalic and atraumatic. Eyes:  Sclera clear, no icterus.   Conjunctiva pink. Ears:  Normal auditory acuity. Nose:  No deformity, discharge, or lesions. Mouth:  No deformity or lesions,oropharynx pink & moist. Neck:  Supple; no masses or thyromegaly. Lungs:  Respirations even and unlabored.  Clear throughout to auscultation.   No wheezes, crackles, or rhonchi. No acute distress. Heart:  Regular rate and rhythm; no murmurs, clicks, rubs, or gallops. Abdomen:  Normal bowel sounds. Soft, non-tender and non-distended without masses, hepatosplenomegaly or hernias noted.  No guarding or rebound tenderness.   Rectal: Not performed Msk:  Symmetrical without gross deformities. Good, equal movement & strength bilaterally. Pulses:  Normal pulses noted. Extremities:  No clubbing or edema.  No cyanosis. Neurologic:  Alert and oriented x3;  grossly normal neurologically. Skin:  Intact without significant lesions or rashes. No jaundice. Lymph Nodes:  No significant cervical adenopathy. Psych:  Alert and cooperative. Normal mood and affect.  Imaging Studies: Reviewed  Assessment and Plan:   Sharri Loya is a 69 y.o. female with history of diabetes, hypertension, hyperlipidemia, history of chronic NSAID use, chronic tobacco use, developed gastric ulcer secondary to NSAIDs use  History of gastric ulcer: No evidence of H. pylori Recommend repeat EGD in December to confirm healing of the ulcer Restart Protonix 40 mg twice daily before meals Avoid NSAID use  Abdominal pain, bloating and chronic constipation Trial of Linzess 145 MCG daily, samples provided  Pancreatic lesion based on the CT scan in 07/2020 No ductal dilatation or inflammation. Tiny hypodensity in the proximal pancreatic body, series 2, image 25 measures approximately 6 x  4 mm.  This lesion has been stable from 2014 exam Recommend follow up pre and post contrast MRI/MRCP or pancreatic protocol CT in 2 years. This recommendation follows ACR consensus guidelines: Management of Incidental Pancreatic Cysts: A White Paper of the ACR Incidental Findings Committee. J Am  Coll Radiol 8937;37:496-646.  Equivocal bladder wall thickening Recommend urine analysis to evaluate for hematuria   Follow up in 3 months  Cephas Darby, MD

## 2020-10-13 ENCOUNTER — Other Ambulatory Visit: Payer: Self-pay

## 2020-10-13 ENCOUNTER — Encounter: Payer: Self-pay | Admitting: Internal Medicine

## 2020-10-13 ENCOUNTER — Ambulatory Visit (INDEPENDENT_AMBULATORY_CARE_PROVIDER_SITE_OTHER): Payer: Medicare Other | Admitting: Internal Medicine

## 2020-10-13 VITALS — BP 128/80 | HR 70 | Ht 61.0 in | Wt 142.0 lb

## 2020-10-13 DIAGNOSIS — I10 Essential (primary) hypertension: Secondary | ICD-10-CM

## 2020-10-13 DIAGNOSIS — Z0000 Encounter for general adult medical examination without abnormal findings: Secondary | ICD-10-CM

## 2020-10-13 DIAGNOSIS — R7303 Prediabetes: Secondary | ICD-10-CM

## 2020-10-13 DIAGNOSIS — E782 Mixed hyperlipidemia: Secondary | ICD-10-CM | POA: Diagnosis not present

## 2020-10-13 DIAGNOSIS — R3911 Hesitancy of micturition: Secondary | ICD-10-CM

## 2020-10-13 DIAGNOSIS — K279 Peptic ulcer, site unspecified, unspecified as acute or chronic, without hemorrhage or perforation: Secondary | ICD-10-CM

## 2020-10-13 DIAGNOSIS — F3341 Major depressive disorder, recurrent, in partial remission: Secondary | ICD-10-CM

## 2020-10-13 DIAGNOSIS — M4802 Spinal stenosis, cervical region: Secondary | ICD-10-CM

## 2020-10-13 LAB — POCT URINALYSIS DIPSTICK
Bilirubin, UA: NEGATIVE
Blood, UA: NEGATIVE
Glucose, UA: NEGATIVE
Ketones, UA: NEGATIVE
Leukocytes, UA: NEGATIVE
Nitrite, UA: NEGATIVE
Protein, UA: NEGATIVE
Spec Grav, UA: 1.01 (ref 1.010–1.025)
Urobilinogen, UA: 0.2 E.U./dL
pH, UA: 7 (ref 5.0–8.0)

## 2020-10-13 MED ORDER — TAMSULOSIN HCL 0.4 MG PO CAPS: 0.4000 mg | ORAL_CAPSULE | Freq: Every day | ORAL | 1 refills | Status: DC

## 2020-10-13 NOTE — Progress Notes (Signed)
Date:  10/13/2020   Name:  Betty Vaughn   DOB:  Apr 19, 1951   MRN:  [id number]   Chief Complaint: Annual Exam (Breast Exam.No pap- discontinued.)  Betty Vaughn is a 69 y.o. female who presents today for her Complete Annual Exam. She feels fairly well. She reports exercising - none due to neck and arm pain.  She reports she is sleeping fairly well. Breast complaints - none.  Mammogram: 09/2020 DEXA: 09/2020 osteopenia spine/normal hip Colonoscopy: 05/2017 repeat 2023  Immunization History  Administered Date(s) Administered  . Fluad Quad(high Dose 65+) 08/15/2020  . H1N1 07/31/2017  . Influenza, High Dose Seasonal PF 08/29/2017, 09/22/2018, 08/27/2019  . Influenza-Unspecified 08/29/2017  . Moderna SARS-COVID-2 Vaccination 01/21/2020, 02/18/2020, 10/04/2020  . PPD Test 06/19/2015  . Pneumococcal Conjugate-13 06/17/2018  . Pneumococcal Polysaccharide-23 06/21/2019  . Tdap 04/11/2016  . Zoster 06/12/2016    Hypertension This is a chronic problem. The problem is controlled (at home 120-130/80). Associated symptoms include neck pain. Pertinent negatives include no chest pain, headaches, palpitations or shortness of breath. Past treatments include beta blockers and calcium channel blockers. The current treatment provides significant improvement. There is no history of kidney disease or CAD/MI.  Hyperlipidemia The problem is controlled. Pertinent negatives include no chest pain or shortness of breath. Current antihyperlipidemic treatment includes statins. The current treatment provides significant improvement of lipids. There are no compliance problems.   Diabetes She presents for her follow-up diabetic visit. Diabetes type: prediabetes. Her disease course has been stable. Pertinent negatives for hypoglycemia include no dizziness, headaches, nervousness/anxiousness or tremors. Pertinent negatives for diabetes include no chest pain, no fatigue, no polydipsia and no polyuria. She  monitors blood glucose at home 1-2 x per day. Her breakfast blood glucose is taken between 6-7 am. Her breakfast blood glucose range is generally 110-130 mg/dl. Her bedtime blood glucose is taken between 10-11 pm. Her bedtime blood glucose range is generally 110-130 mg/dl.  Depression      (And recent gastric ulcer)  This is a chronic problem.  Associated symptoms include no fatigue and no headaches. Gastroesophageal Reflux She complains of heartburn. She reports no abdominal pain, no chest pain, no coughing or no wheezing. and recent gastric ulcer. The problem has been gradually improving. Pertinent negatives include no fatigue. She has tried a PPI for the symptoms. The treatment provided moderate relief. Past procedures include an EGD (done in september and a repeat planned for December).  Neck Pain  This is a chronic problem. The problem has been gradually worsening. The pain is present in the midline. The quality of the pain is described as aching and burning. Pertinent negatives include no chest pain, fever, headaches or trouble swallowing. Associated symptoms comments: Arm pain worse on left; hard to do ADLs.  MRI 12/2017: IMPRESSION: 1. Stable solid fusion C3-6. 2. Progressive adjacent level disease at C2-3 with worsening central canal stenosis. Canal now measures 6 mm in AP diameter. 3. Slight progression of a broad-based disc osteophyte complex at C6-7 without a focal stenosis.  Lab Results  Component Value Date   CREATININE 0.93 08/04/2020   BUN 7 (L) 08/04/2020   NA 141 08/04/2020   K 3.8 08/04/2020   CL 106 08/04/2020   CO2 28 08/04/2020   Lab Results  Component Value Date   CHOL 139 09/08/2019   HDL 43 09/08/2019   LDLCALC 77 09/08/2019   TRIG 101 09/08/2019   CHOLHDL 3.2 09/08/2019   Lab Results  Component Value Date  TSH 2.370 09/08/2019   Lab Results  Component Value Date   HGBA1C 6.1 (H) 08/01/2020   Lab Results  Component Value Date   WBC 5.7 08/04/2020    HGB 11.4 (L) 08/04/2020   HCT 32.1 (L) 08/04/2020   MCV 90.7 08/04/2020   PLT 233 08/04/2020   Lab Results  Component Value Date   ALT 28 07/13/2020   AST 31 07/13/2020   ALKPHOS 79 07/13/2020   BILITOT 0.8 07/13/2020     Review of Systems  Constitutional: Negative for chills, fatigue and fever.  HENT: Negative for congestion, hearing loss, tinnitus, trouble swallowing and voice change.   Eyes: Negative for visual disturbance.  Respiratory: Negative for cough, chest tightness, shortness of breath and wheezing.   Cardiovascular: Negative for chest pain, palpitations and leg swelling.  Gastrointestinal: Positive for heartburn. Negative for abdominal pain, constipation, diarrhea and vomiting.  Endocrine: Negative for polydipsia and polyuria.  Genitourinary: Positive for difficulty urinating (urinary hesitancy). Negative for dysuria, frequency, genital sores, vaginal bleeding and vaginal discharge.  Musculoskeletal: Positive for neck pain. Negative for arthralgias, gait problem and joint swelling.  Skin: Negative for color change and rash.  Neurological: Negative for dizziness, tremors, light-headedness and headaches.  Hematological: Negative for adenopathy. Does not bruise/bleed easily.  Psychiatric/Behavioral: Positive for depression. Negative for dysphoric mood and sleep disturbance. The patient is not nervous/anxious.     Patient Active Problem List   Diagnosis Date Noted  . PUD (peptic ulcer disease)   . Perforated gastric ulcer ([place]) 08/01/2020  . Myofascial muscle pain 01/22/2019  . Weakness of both arms 09/08/2018  . Renal insufficiency 09/08/2018  . 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 Cervical stenosis of spinal canal 01/14/2018  . Personal history of colonic polyps   . Rectal polyp   . Tobacco use disorder 11/20/2016  . Neuropathy 04/10/2016  . Prediabetes 05/13/2015  . Benign essential tremor 05/13/2015  . Chronic insomnia 05/13/2015  . 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 Multiple joint complaints 05/13/2015  . Depression, major, recurrent, in partial remission ([place]) 02/23/2014  . Essential hypertension 02/23/2014  . Mixed hyperlipidemia 02/23/2014  . Chronic headache disorder 01/16/2012  . 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 CAD S/P percutaneous coronary angioplasty 03/16/2010    Allergies  Allergen Reactions  . Azithromycin Swelling  . Propoxyphene Anaphylaxis  . Tramadol Anaphylaxis  . Penicillins Swelling  . Piroxicam Rash  . Sertraline Hcl Rash    Past Surgical History:  Procedure Laterality Date  . ABDOMINAL HYSTERECTOMY     total  . CERVICAL FUSION  2005  . CERVICAL FUSION  01/2018   C2-T2 posterior laminectomy and fusion; removal of previous hardware  . CHOLECYSTECTOMY    . COLONOSCOPY WITH PROPOFOL N/A 06/12/2017   Procedure: COLONOSCOPY WITH PROPOFOL;  Surgeon: Lucilla Lame, MD;  Location: [facility];  Service: Gastroenterology;  Laterality: N/A;  . 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"  . ESOPHAGOGASTRODUODENOSCOPY (EGD) WITH PROPOFOL N/A 08/22/2020   Procedure: ESOPHAGOGASTRODUODENOSCOPY (EGD) WITH PROPOFOL;  Surgeon: Lin Landsman, MD;  Location: [facility];  Service: Gastroenterology;  Laterality: N/A;  . HAND SURGERY Bilateral 2001  . POLYPECTOMY  2000  . POLYPECTOMY N/A 06/12/2017   Procedure: POLYPECTOMY;  Surgeon: Lucilla Lame, MD;  Location: [facility];  Service: Gastroenterology;  Laterality: N/A;  . rigger finger      Social History   Tobacco Use  . Smoking status: Current Every Day Smoker    Packs/day: 0.25  Years: 30.00    Pack years: 7.50    Types: Cigarettes    Last attempt to quit: 03/25/2020    Years since quitting: 0.5  . Smokeless tobacco: Never Used  Vaping Use  . Vaping Use: Every day  Substance Use Topics  . Alcohol use: Yes    Alcohol/week: 1.0 standard drink    Types: 1 Glasses of wine per week    Comment: occ  . Drug use: No      Medication list has been reviewed and updated.  Current Meds  Medication Sig  . Accu-Chek FastClix Lancets MISC USE TO TEST BLOOD SUGAR TWICE DAILY  . ACCU-CHEK GUIDE test strip USE TO TEST BLOOD SUGAR TWICE DAILY  . amLODipine (NORVASC) 5 MG tablet TAKE 1 TABLET BY MOUTH AT BEDTIME  . atorvastatin (LIPITOR) 40 MG tablet TAKE 1 TABLET BY MOUTH EVERY DAY  . Blood Glucose Monitoring Suppl (ACCU-CHEK GUIDE ME) w/Device KIT TO TEST TWICE DAILY  . Calcium Carbonate-Vitamin D 600-400 MG-UNIT tablet TAKE 1 TABLET BY MOUTH EVERY DAY  . chlorthalidone (HYGROTON) 25 MG tablet TAKE 1 TABLET BY MOUTH EVERY DAY  . DULoxetine (CYMBALTA) 60 MG capsule TAKE 1 CAPSULE(60 MG) BY MOUTH DAILY  . gabapentin (NEURONTIN) 300 MG capsule TAKE 2 CAPSULES(600 MG) BY MOUTH TWICE DAILY  . methocarbamol (ROBAXIN) 500 MG tablet TAKE 1 TABLET BY MOUTH FOUR TIMES DAILY  . metoprolol tartrate (LOPRESSOR) 50 MG tablet TAKE 1 TABLET BY MOUTH TWICE DAILY  . Multiple Minerals-Vitamins (CALCIUM & VIT D3 BONE HEALTH PO) Take by mouth. 600-400 mg  . nitroGLYCERIN (NITROSTAT) 0.4 MG SL tablet Place under the tongue.  . pantoprazole (PROTONIX) 40 MG tablet Take 1 tablet (40 mg total) by mouth 2 (two) times daily before a meal.  . potassium chloride SA (KLOR-CON) 20 MEQ tablet TAKE 1 TABLET BY MOUTH EVERY DAY  . traZODone (DESYREL) 50 MG tablet TAKE 1 TABLET(50 MG) BY MOUTH AT BEDTIME  . VENTOLIN HFA 108 (90 Base) MCG/ACT inhaler INHALE 2 PUFFS INTO THE LUNGS EVERY 6 HOURS AS NEEDED FOR WHEEZING OR SHORTNESS OF BREATH    PHQ 2/9 Scores 10/13/2020 06/21/2020 06/07/2020 03/09/2020  PHQ - 2 Score 0 _0 PHQ- 9 Score 0 _1 GAD 7 : Generalized Anxiety Score 10/13/2020 06/07/2020 03/09/2020  Nervous, Anxious, on Edge 0 2 3  Control/stop worrying 0 3 0  Worry too much - different things 0 3 0  Trouble relaxing 0 1 1  Restless 0 0 3  Easily annoyed or irritable 0 3 2  Afraid - awful might happen 0 0 2  Total GAD 7 Score  0 12 11  Anxiety Difficulty Not difficult at all Not difficult at all Somewhat difficult    BP Readings from Last 3 Encounters:  10/13/20 128/80  10/04/20 123/73  08/22/20 111/80    Physical Exam Vitals and nursing note reviewed.  Constitutional:      General: She is in acute distress (alert and pleasant but obviously uncomfortable moving onto and positioning on the the exam table).     Appearance: She is well-developed.  HENT:     Head: Normocephalic and atraumatic.     Right Ear: Tympanic membrane and ear canal normal.     Left Ear: Tympanic membrane and ear canal normal.     Nose:     Right Sinus: No maxillary sinus tenderness.     Left Sinus: No maxillary sinus tenderness.  Eyes:  General: No scleral icterus.       Right eye: No discharge.        Left eye: No discharge.     Conjunctiva/sclera: Conjunctivae normal.  Neck:     Thyroid: No thyromegaly.     Vascular: No carotid bruit.  Cardiovascular:     Rate and Rhythm: Normal rate and regular rhythm.     Pulses: Normal pulses.     Heart sounds: Normal heart sounds.  Pulmonary:     Effort: Pulmonary effort is normal. No respiratory distress.     Breath sounds: No wheezing.  Chest:     Breasts:        Right: No mass, nipple discharge, skin change or tenderness.        Left: No mass, nipple discharge, skin change or tenderness.  Abdominal:     General: Bowel sounds are normal.     Palpations: Abdomen is soft.     Tenderness: There is no abdominal tenderness.  Musculoskeletal:     Right shoulder: Tenderness present. No bony tenderness. Decreased range of motion.     Left shoulder: Tenderness present. No bony tenderness. Decreased range of motion.     Cervical back: Rigidity, spasms and tenderness present. No erythema. Pain with movement present. Decreased range of motion.     Right lower leg: No edema.     Left lower leg: No edema.  Lymphadenopathy:     Cervical: No cervical adenopathy.  Skin:    General: Skin  is warm and dry.     Capillary Refill: Capillary refill takes less than 2 seconds.     Findings: No rash.  Neurological:     General: No focal deficit present.     Mental Status: She is alert and oriented to person, place, and time.     Cranial Nerves: No cranial nerve deficit.     Sensory: No sensory deficit.     Motor: Tremor (with movement/pain in RUE) present. No weakness.     Gait: Gait is intact.     Deep Tendon Reflexes: Reflexes are normal and symmetric.  Psychiatric:        Attention and Perception: Attention normal.        Mood and Affect: Mood normal.     Wt Readings from Last 3 Encounters:  10/13/20 142 lb (64.4 kg)  10/04/20 144 lb 2 oz (65.4 kg)  08/22/20 145 lb (65.8 kg)    BP 128/80   Pulse 70   Ht _0  (1.549 m)   Wt 142 lb (64.4 kg)   LMP  (LMP Unknown)   SpO2 99%   BMI 26.83 kg/m   Assessment and Plan: 1. Annual physical exam UTD on HM issues - POCT urinalysis dipstick  2. Essential hypertension Clinically stable exam with well controlled BP. Tolerating medications without side effects at this time. Pt to continue current regimen and low sodium diet; benefits of regular exercise as able discussed. - Comprehensive metabolic panel  3. Prediabetes Continue healthy diet, weight control.  FSBS as needed - Hemoglobin A1c  4. Mixed hyperlipidemia On statin therapy without side effects - Lipid panel  5. Depression, major, recurrent, in partial remission ([place]) Clinically stable on current regimen with good control of symptoms, No SI or HI. Will continue current therapy with cymbalta - TSH  6. PUD (peptic ulcer disease) Now on daily PPI with improvement in sx Follow up with GI for EGD next month - CBC with Differential/Platelet  7. Cervical stenosis of  spinal canal She is having more issues with shoulder and arm pain with significant limitation in movement and ability to perform ADLS MRI 2 years ago showed some disease progression above and  below the fusion I recommend a neurosurgery follow up - she will consider.  Call here if referral is needed.  8. Urinary hesitancy - tamsulosin (FLOMAX) 0.4 MG CAPS capsule; Take 1 capsule (0.4 mg total) by mouth daily.  Dispense: 90 capsule; Refill: 1   Partially dictated using Editor, commissioning. Any errors are unintentional.  Halina Maidens, MD [facility]  10/13/2020

## 2020-10-14 LAB — COMPREHENSIVE METABOLIC PANEL
ALT: 17 IU/L (ref 0–32)
AST: 19 IU/L (ref 0–40)
Albumin/Globulin Ratio: 1.5 (ref 1.2–2.2)
Albumin: 4.4 g/dL (ref 3.8–4.8)
Alkaline Phosphatase: 111 IU/L (ref 44–121)
BUN/Creatinine Ratio: 13 (ref 12–28)
BUN: 13 mg/dL (ref 8–27)
Bilirubin Total: 0.2 mg/dL (ref 0.0–1.2)
CO2: 25 mmol/L (ref 20–29)
Calcium: 9.9 mg/dL (ref 8.7–10.3)
Chloride: 98 mmol/L (ref 96–106)
Creatinine, Ser: 0.99 mg/dL (ref 0.57–1.00)
GFR calc Af Amer: 67 mL/min/{1.73_m2} (ref >59)
GFR calc non Af Amer: 58 mL/min/{1.73_m2} — ABNORMAL LOW (ref >59)
Globulin, Total: 2.9 g/dL (ref 1.5–4.5)
Glucose: 117 mg/dL — ABNORMAL HIGH (ref 65–99)
Potassium: 3.7 mmol/L (ref 3.5–5.2)
Sodium: 139 mmol/L (ref 134–144)
Total Protein: 7.3 g/dL (ref 6.0–8.5)

## 2020-10-14 LAB — LIPID PANEL
Chol/HDL Ratio: 3.9 ratio (ref 0.0–4.4)
Cholesterol, Total: 154 mg/dL (ref 100–199)
HDL: 40 mg/dL (ref >39)
LDL Chol Calc (NIH): 88 mg/dL (ref 0–99)
Triglycerides: 146 mg/dL (ref 0–149)
VLDL Cholesterol Cal: 26 mg/dL (ref 5–40)

## 2020-10-14 LAB — CBC WITH DIFFERENTIAL/PLATELET
Basophils Absolute: 0 10*3/uL (ref 0.0–0.2)
Basos: 0 %
EOS (ABSOLUTE): 0.1 10*3/uL (ref 0.0–0.4)
Eos: 1 %
Hematocrit: 40.6 % (ref 34.0–46.6)
Hemoglobin: 13.8 g/dL (ref 11.1–15.9)
Immature Grans (Abs): 0 10*3/uL (ref 0.0–0.1)
Immature Granulocytes: 0 %
Lymphocytes Absolute: 2.4 10*3/uL (ref 0.7–3.1)
Lymphs: 33 %
MCH: 31.1 pg (ref 26.6–33.0)
MCHC: 34 g/dL (ref 31.5–35.7)
MCV: 91 fL (ref 79–97)
Monocytes Absolute: 0.5 10*3/uL (ref 0.1–0.9)
Monocytes: 6 %
Neutrophils Absolute: 4.4 10*3/uL (ref 1.4–7.0)
Neutrophils: 60 %
Platelets: 275 10*3/uL (ref 150–450)
RBC: 4.44 x10E6/uL (ref 3.77–5.28)
RDW: 12.7 % (ref 11.7–15.4)
WBC: 7.4 10*3/uL (ref 3.4–10.8)

## 2020-10-14 LAB — HEMOGLOBIN A1C
Est. average glucose Bld gHb Est-mCnc: 131 mg/dL
Hgb A1c MFr Bld: 6.2 % — ABNORMAL HIGH (ref 4.8–5.6)

## 2020-10-14 LAB — TSH: TSH: 2.68 u[IU]/mL (ref 0.450–4.500)

## 2020-10-17 ENCOUNTER — Telehealth: Payer: Self-pay | Admitting: Gastroenterology

## 2020-10-17 MED ORDER — LINACLOTIDE 145 MCG PO CAPS: 145.0000 ug | ORAL_CAPSULE | Freq: Every day | ORAL | 2 refills | Status: DC

## 2020-10-17 NOTE — Telephone Encounter (Signed)
Dr. Marius Ditch gave patient Linzess 172mcg. Sent medication to the pharmacy

## 2020-10-17 NOTE — Telephone Encounter (Signed)
Patient states that Dr. Marius Ditch had given her samples of linzess and she would now like a prescription of it sent into [facility] on [address].

## 2020-10-23 ENCOUNTER — Other Ambulatory Visit: Payer: Self-pay | Admitting: Internal Medicine

## 2020-10-23 NOTE — Telephone Encounter (Signed)
Requested Prescriptions  Pending Prescriptions Disp Refills  . metoprolol tartrate (LOPRESSOR) 50 MG tablet [Pharmacy Med Name: METOPROLOL TARTRATE 50MG  TABLETS] 180 tablet 1    Sig: TAKE 1 TABLET BY MOUTH TWICE DAILY     Cardiovascular:  Beta Blockers Passed - 10/23/2020  6:29 AM      Passed - Last BP in normal range    BP Readings from Last 1 Encounters:  10/13/20 128/80         Passed - Last Heart Rate in normal range    Pulse Readings from Last 1 Encounters:  10/13/20 70         Passed - Valid encounter within last 6 months    Recent Outpatient Visits          1 week ago Annual physical exam   [facility] Glean Hess, [facility]   2 months ago Acute gastric ulcer with perforation [facility])   [facility] Glean Hess, MD   4 months ago Primary osteoarthritis of right knee   [facility] Glean Hess, MD   7 months ago Glucose intolerance (impaired glucose tolerance)   [facility] Glean Hess, MD   1 year ago Annual physical exam   [facility] Glean Hess, MD      Future Appointments            In 2 months Vanga, Tally Due, MD [facility]   In 5 months [name], [name], MD [facility], [place]   In 11 months Glean Hess, MD [facility]           . amLODipine ([place]) 5 MG tablet [Pharmacy Med Name: AMLODIPINE BESYLATE 5MG  TABLETS] 90 tablet 1    Sig: TAKE 1 TABLET BY MOUTH AT BEDTIME     Cardiovascular:  Calcium Channel Blockers Passed - 10/23/2020  6:29 AM      Passed - Last BP in normal range    BP Readings from Last 1 Encounters:  10/13/20 128/80         Passed - Valid encounter within last 6 months    Recent Outpatient Visits          1 week ago Annual physical exam   [facility] Glean Hess, MD   2 months ago Acute gastric ulcer with perforation [facility])   [facility] Glean Hess, MD   4 months ago Primary  osteoarthritis of right knee   [facility] Glean Hess, MD   7 months ago Glucose intolerance (impaired glucose tolerance)   [facility] Glean Hess, MD   1 year ago Annual physical exam   [facility] Glean Hess, MD      Future Appointments            In 2 months Vanga, Tally Due, MD [place]   In 5 months [name], [name], MD [facility], [place]   In 11 months [name] [name], MD [facility], [place]           . atorvastatin (LIPITOR) 40 MG tablet [Pharmacy Med Name: ATORVASTATIN 40MG  TABLETS] 90 tablet 1    Sig: TAKE 1 TABLET BY MOUTH EVERY DAY     Cardiovascular:  Antilipid - Statins Failed - 10/23/2020  6:29 AM      Failed - LDL in normal range and within 360 days    LDL Chol  Calc (NIH)  Date Value Ref Range Status  10/13/2020 88 0 - 99 mg/dL Final         Passed - Total Cholesterol in normal range and within 360 days    Cholesterol, Total  Date Value Ref Range Status  10/13/2020 154 100 - 199 mg/dL Final         Passed - HDL in normal range and within 360 days    HDL  Date Value Ref Range Status  10/13/2020 40 >39 mg/dL Final         Passed - Triglycerides in normal range and within 360 days    Triglycerides  Date Value Ref Range Status  10/13/2020 146 0 - 149 mg/dL Final         Passed - Patient is not pregnant      Passed - Valid encounter within last 12 months    Recent Outpatient Visits          1 week ago Annual physical exam   [facility] Glean Hess, MD   2 months ago Acute gastric ulcer with perforation [facility])   [facility] Glean Hess, MD   4 months ago Primary osteoarthritis of right knee   [facility] Glean Hess, MD   7 months ago Glucose intolerance (impaired glucose tolerance)   [facility] Glean Hess, MD   1 year ago Annual physical exam   [facility] Glean Hess, MD       Future Appointments            In 2 months Vanga, Tally Due, MD [facility]   In 5 months [name] [name], MD [facility], [place]   In 11 months [name] [name], MD [facility], [place]           . chlorthalidone (HYGROTON) 25 MG tablet [Pharmacy Med Name: CHLORTHALIDONE 25MG  TABLETS] 90 tablet 1    Sig: TAKE 1 TABLET BY MOUTH EVERY DAY     Cardiovascular: Diuretics - Thiazide Passed - 10/23/2020  6:29 AM      Passed - Ca in normal range and within 360 days    Calcium  Date Value Ref Range Status  10/13/2020 9.9 8.7 - 10.3 mg/dL Final   Calcium, Total  Date Value Ref Range Status  07/06/2013 9.1 8.5 - 10.1 mg/dL Final         Passed - Cr in normal range and within 360 days    Creatinine  Date Value Ref Range Status  07/06/2013 0.91 0.60 - 1.30 mg/dL Final   Creatinine, Ser  Date Value Ref Range Status  10/13/2020 0.99 0.57 - 1.00 mg/dL Final         Passed - K in normal range and within 360 days    Potassium  Date Value Ref Range Status  10/13/2020 3.7 3.5 - 5.2 mmol/L Final  07/06/2013 3.6 3.5 - 5.1 mmol/L Final         Passed - Na in normal range and within 360 days    Sodium  Date Value Ref Range Status  10/13/2020 139 134 - 144 mmol/L Final  07/06/2013 137 136 - 145 mmol/L Final         Passed - Last BP in normal range    BP Readings from Last 1 Encounters:  10/13/20 128/80         Passed - Valid encounter within last 6 months  Recent Outpatient Visits          1 week ago Annual physical exam   [facility] Glean Hess, MD   2 months ago Acute gastric ulcer with perforation [facility])   [facility] Glean Hess, MD   4 months ago Primary osteoarthritis of right knee   [facility] Glean Hess, MD   7 months ago Glucose intolerance (impaired glucose tolerance)   [facility] Glean Hess, MD   1 year ago Annual physical exam   [facility] Glean Hess, MD      Future Appointments            In 2 months Vanga, Tally Due, MD [place]   In 5 months [name], [name], MD [facility], [place]   In 11 months [name], [name], MD [facility]           . methocarbamol (ROBAXIN) 500 MG tablet [Pharmacy Med Name: UUEKCMKLKJZPH 500MG  TABLETS] 120 tablet 0    Sig: TAKE 1 TABLET BY MOUTH FOUR TIMES DAILY     Not Delegated - Analgesics:  Muscle Relaxants Failed - 10/23/2020  6:29 AM      Failed - This refill cannot be delegated      Passed - Valid encounter within last 6 months    Recent Outpatient Visits          1 week ago Annual physical exam   [facility] Glean Hess, MD   2 months ago Acute gastric ulcer with perforation [facility])   [facility] Glean Hess, MD   4 months ago Primary osteoarthritis of right knee   [facility] Glean Hess, MD   7 months ago Glucose intolerance (impaired glucose tolerance)   [facility] Glean Hess, MD   1 year ago Annual physical exam   [facility] Glean Hess, MD      Future Appointments            In 2 months Vanga, Tally Due, MD [place]   In 5 months [name], [name], MD [facility], [place]   In 11 months [name], [name], MD [facility], [facility]

## 2020-10-23 NOTE — Telephone Encounter (Signed)
Requested medication (s) are due for refill today:  Yes  Requested medication (s) are on the active medication list:  Yes  Future visit scheduled:  Yes  Last Refill: 09/25/20; #120; 0 refills  Notes to clinic: not delegated  Requested Prescriptions  Pending Prescriptions Disp Refills   methocarbamol (ROBAXIN) 500 MG tablet [Pharmacy Med Name: [id number] 500MG  TABLETS] 120 tablet 0    Sig: TAKE 1 TABLET BY MOUTH FOUR TIMES DAILY      Not Delegated - Analgesics:  Muscle Relaxants Failed - 10/23/2020  6:29 AM      Failed - This refill cannot be delegated      Passed - Valid encounter within last 6 months    Recent Outpatient Visits           1 week ago Annual physical exam   [facility] Glean Hess, MD   2 months ago Acute gastric ulcer with perforation [facility])   [place], Laura H, MD   4 months ago Primary osteoarthritis of right knee   [facility] Glean Hess, MD   7 months ago Glucose intolerance (impaired glucose tolerance)   [facility] Glean Hess, MD   1 year ago Annual physical exam   [facility] Glean Hess, MD       Future Appointments             In 2 months Vanga, Tally Due, MD [place]   In 5 months [name], [name], MD [facility], [place]   In 11 months Glean Hess, MD [facility]             Signed Prescriptions Disp Refills   metoprolol tartrate (LOPRESSOR) 50 MG tablet 180 tablet 1    Sig: TAKE 1 TABLET BY MOUTH TWICE DAILY      Cardiovascular:  Beta Blockers Passed - 10/23/2020  6:29 AM      Passed - Last BP in normal range    BP Readings from Last 1 Encounters:  10/13/20 128/80          Passed - Last Heart Rate in normal range    Pulse Readings from Last 1 Encounters:  10/13/20 70          Passed - Valid encounter within last 6 months    Recent Outpatient Visits           1 week ago Annual physical  exam   [facility] Glean Hess, MD   2 months ago Acute gastric ulcer with perforation [facility])   [facility] Glean Hess, MD   4 months ago Primary osteoarthritis of right knee   [facility] Glean Hess, MD   7 months ago Glucose intolerance (impaired glucose tolerance)   [facility] Glean Hess, MD   1 year ago Annual physical exam   [facility] Glean Hess, MD       Future Appointments             In 2 months Vanga, Tally Due, MD [place]   In 5 months [name] [name], MD [facility], [place]   In 11 months Glean Hess, MD [facility]              amLODipine (NORVASC) 5 MG tablet 90 tablet 1    Sig: TAKE 1 TABLET BY MOUTH AT BEDTIME  Cardiovascular:  Calcium Channel Blockers Passed - 10/23/2020  6:29 AM      Passed - Last BP in normal range    BP Readings from Last 1 Encounters:  10/13/20 128/80          Passed - Valid encounter within last 6 months    Recent Outpatient Visits           1 week ago Annual physical exam   [facility] Glean Hess, MD   2 months ago Acute gastric ulcer with perforation [facility])   [facility] Glean Hess, MD   4 months ago Primary osteoarthritis of right knee   [facility] Glean Hess, MD   7 months ago Glucose intolerance (impaired glucose tolerance)   [facility], MD   1 year ago Annual physical exam   [facility] Glean [facility], MD       Future Appointments             In 2 months Vanga, Tally Due, MD [place]   In 5 months [name] [name], MD [facility], [place]   In 11 months Glean Hess, MD [facility]              atorvastatin (LIPITOR) 40 MG tablet 90 tablet 1    Sig: TAKE 1 TABLET BY MOUTH EVERY DAY      Cardiovascular:  Antilipid - Statins Failed -  10/23/2020  6:29 AM      Failed - LDL in normal range and within 360 days    LDL Chol Calc (NIH)  Date Value Ref Range Status  10/13/2020 88 0 - 99 mg/dL Final          Passed - Total Cholesterol in normal range and within 360 days    Cholesterol, Total  Date Value Ref Range Status  10/13/2020 154 100 - 199 mg/dL Final          Passed - HDL in normal range and within 360 days    HDL  Date Value Ref Range Status  10/13/2020 40 >39 mg/dL Final          Passed - Triglycerides in normal range and within 360 days    Triglycerides  Date Value Ref Range Status  10/13/2020 146 0 - 149 mg/dL Final          Passed - Patient is not pregnant      Passed - Valid encounter within last 12 months    Recent Outpatient Visits           1 week ago Annual physical exam   [facility] Glean Hess, MD   2 months ago Acute gastric ulcer with perforation [facility])   [place], Laura H, MD   4 months ago Primary osteoarthritis of right knee   [facility] Glean Hess, MD   7 months ago Glucose intolerance (impaired glucose tolerance)   [facility] Glean Hess, MD   1 year ago Annual physical exam   [facility] Glean Hess, MD       Future Appointments             In 2 months Vanga, Tally Due, MD [place]   In 5 months [name], [name], MD [facility], [place]   In 11 months [name], [name], MD [facility], [facility]  chlorthalidone (HYGROTON) 25 MG tablet 90 tablet 1    Sig: TAKE 1 TABLET BY MOUTH EVERY DAY      Cardiovascular: Diuretics - Thiazide Passed - 10/23/2020  6:29 AM      Passed - Ca in normal range and within 360 days    Calcium  Date Value Ref Range Status  10/13/2020 9.9 8.7 - 10.3 mg/dL Final   Calcium, Total  Date Value Ref Range Status  07/06/2013 9.1 8.5 - 10.1 mg/dL Final          Passed - Cr in normal range and within 360 days     Creatinine  Date Value Ref Range Status  07/06/2013 0.91 0.60 - 1.30 mg/dL Final   Creatinine, Ser  Date Value Ref Range Status  10/13/2020 0.99 0.57 - 1.00 mg/dL Final          Passed - K in normal range and within 360 days    Potassium  Date Value Ref Range Status  10/13/2020 3.7 3.5 - 5.2 mmol/L Final  07/06/2013 3.6 3.5 - 5.1 mmol/L Final          Passed - Na in normal range and within 360 days    Sodium  Date Value Ref Range Status  10/13/2020 139 134 - 144 mmol/L Final  07/06/2013 137 136 - 145 mmol/L Final          Passed - Last BP in normal range    BP Readings from Last 1 Encounters:  10/13/20 128/80          Passed - Valid encounter within last 6 months    Recent Outpatient Visits           1 week ago Annual physical exam   [facility] Glean Hess, MD   2 months ago Acute gastric ulcer with perforation [facility])   [facility] Glean Hess, MD   4 months ago Primary osteoarthritis of right knee   [facility] Glean [facility], MD   7 months ago Glucose intolerance (impaired glucose tolerance)   [facility] Glean Hess, MD   1 year ago Annual physical exam   [facility] Glean [facility], MD       Future Appointments             In 2 months Vanga, Tally Due, MD [place]   In 5 months [name], [name], MD [facility], [place]   In 11 months [name], [name], MD [facility], [facility]

## 2020-10-30 ENCOUNTER — Other Ambulatory Visit
Admission: RE | Admit: 2020-10-30 | Discharge: 2020-10-30 | Disposition: A | Discharge status: Home / Self Care | Payer: Medicare Other | Source: Ambulatory Visit | Attending: Gastroenterology | Admitting: Gastroenterology

## 2020-10-30 ENCOUNTER — Other Ambulatory Visit: Payer: Self-pay

## 2020-10-30 DIAGNOSIS — Z20822 Contact with and (suspected) exposure to covid-19: Secondary | ICD-10-CM | POA: Insufficient documentation

## 2020-10-30 DIAGNOSIS — Z01812 Encounter for preprocedural laboratory examination: Secondary | ICD-10-CM | POA: Insufficient documentation

## 2020-10-31 LAB — SARS CORONAVIRUS 2 (TAT 6-24 HRS): SARS Coronavirus 2: NEGATIVE

## 2020-11-01 ENCOUNTER — Ambulatory Visit
Admission: RE | Admit: 2020-11-01 | Discharge: 2020-11-01 | Disposition: A | Discharge status: Home / Self Care | Payer: Medicare Other | Attending: Gastroenterology | Admitting: Gastroenterology

## 2020-11-01 ENCOUNTER — Encounter
Admission: RE | Disposition: A | Discharge status: Home / Self Care | Payer: Self-pay | Source: Home / Self Care | Attending: Gastroenterology

## 2020-11-01 ENCOUNTER — Ambulatory Visit: Payer: Medicare Other | Admitting: Certified Registered"

## 2020-11-01 ENCOUNTER — Encounter: Payer: Self-pay | Admitting: Gastroenterology

## 2020-11-01 DIAGNOSIS — K254 Chronic or unspecified gastric ulcer with hemorrhage: Secondary | ICD-10-CM | POA: Insufficient documentation

## 2020-11-01 DIAGNOSIS — Z09 Encounter for follow-up examination after completed treatment for conditions other than malignant neoplasm: Secondary | ICD-10-CM | POA: Diagnosis present

## 2020-11-01 DIAGNOSIS — Z7984 Long term (current) use of oral hypoglycemic drugs: Secondary | ICD-10-CM | POA: Diagnosis not present

## 2020-11-01 DIAGNOSIS — Z79899 Other long term (current) drug therapy: Secondary | ICD-10-CM | POA: Diagnosis not present

## 2020-11-01 DIAGNOSIS — K251 Acute gastric ulcer with perforation: Secondary | ICD-10-CM

## 2020-11-01 HISTORY — PX: ESOPHAGOGASTRODUODENOSCOPY (EGD) WITH PROPOFOL: SHX5813

## 2020-11-01 LAB — GLUCOSE, CAPILLARY: Glucose-Capillary: 107 mg/dL — ABNORMAL HIGH (ref 70–99)

## 2020-11-01 SURGERY — ESOPHAGOGASTRODUODENOSCOPY (EGD) WITH PROPOFOL
Anesthesia: General

## 2020-11-01 MED ORDER — PROPOFOL 500 MG/50ML IV EMUL
INTRAVENOUS | Status: DC | PRN
  Administered 2020-11-01: 145 ug/kg/min via INTRAVENOUS

## 2020-11-01 MED ORDER — GLYCOPYRROLATE 0.2 MG/ML IJ SOLN
INTRAMUSCULAR | Status: DC | PRN
  Administered 2020-11-01: .2 mg via INTRAVENOUS

## 2020-11-01 MED ORDER — PROPOFOL 10 MG/ML IV BOLUS
INTRAVENOUS | Status: DC | PRN
  Administered 2020-11-01: 50 mg via INTRAVENOUS

## 2020-11-01 MED ORDER — SODIUM CHLORIDE 0.9 % IV SOLN: INTRAVENOUS | Status: DC

## 2020-11-01 MED ORDER — LIDOCAINE HCL (CARDIAC) PF 100 MG/5ML IV SOSY
PREFILLED_SYRINGE | INTRAVENOUS | Status: DC | PRN
  Administered 2020-11-01: 100 mg via INTRAVENOUS

## 2020-11-01 NOTE — Op Note (Signed)
[facility] Gastroenterology Patient Name: Betty Vaughn Procedure Date: 11/01/2020 8:56 AM MRN: [id number] Account #: [id number] Date of Birth: 05/18/1951 Admit Type: Outpatient Age: 69 Room: [facility] ENDO ROOM 4 Gender: Female Note Status: Finalized Procedure:             Upper GI endoscopy Indications:           Follow-up of acute gastric ulcer, Follow-up of acute                         gastric ulcer with hemorrhage Providers:             Lin Landsman MD, MD Medicines:             General Anesthesia Complications:         No immediate complications. Estimated blood loss: None. Procedure:             Pre-Anesthesia Assessment:                        - Prior to the procedure, a History and Physical was                         performed, and patient medications and allergies were                         reviewed. The patient is competent. The risks and                         benefits of the procedure and the sedation options and                         risks were discussed with the patient. All questions                         were answered and informed consent was obtained.                         Patient identification and proposed procedure were                         verified by the physician, the nurse, the                         anesthesiologist, the anesthetist and the technician                         in the pre-procedure area in the procedure room in the                         endoscopy suite. Mental Status Examination: alert and                         oriented. Airway Examination: normal oropharyngeal                         airway and neck mobility. Respiratory Examination:                         clear to auscultation. CV  Examination: normal.                         Prophylactic Antibiotics: The patient does not require                         prophylactic antibiotics. Prior Anticoagulants: The                         patient has taken no  previous anticoagulant or                         antiplatelet agents. ASA Grade Assessment: III - A                         patient with severe systemic disease. After reviewing                         the risks and benefits, the patient was deemed in                         satisfactory condition to undergo the procedure. The                         anesthesia plan was to use general anesthesia.                         Immediately prior to administration of medications,                         the patient was re-assessed for adequacy to receive                         sedatives. The heart rate, respiratory rate, oxygen                         saturations, blood pressure, adequacy of pulmonary                         ventilation, and response to care were monitored                         throughout the procedure. The physical status of the                         patient was re-assessed after the procedure.                        After obtaining informed consent, the endoscope was                         passed under direct vision. Throughout the procedure,                         the patient's blood pressure, pulse, and oxygen                         saturations were monitored continuously. The Endoscope  was introduced through the mouth, and advanced to the                         second part of duodenum. The upper GI endoscopy was                         accomplished without difficulty. The patient tolerated                         the procedure well. Findings:      The duodenal bulb and second portion of the duodenum were normal.      One non-bleeding superficial gastric ulcer with a clean ulcer base       (Forrest Class III) was found in the prepyloric region of the stomach.       The lesion was 2 mm in largest dimension significantly healed ulcer.      The exam was otherwise without abnormality.      The cardia and gastric fundus were normal on  retroflexion.      The gastroesophageal junction and examined esophagus were normal. Impression:            - Normal duodenal bulb and second portion of the                         duodenum.                        - Non-bleeding gastric ulcer with a clean ulcer base                         (Forrest Class III).                        - The examination was otherwise normal.                        - Normal gastroesophageal junction and esophagus.                        - No specimens collected. Recommendation:        - Discharge patient to home (with escort).                        - Resume previous diet today.                        - Continue present medications.                        - No ibuprofen, naproxen, or other non-steroidal                         anti-inflammatory drugs. Procedure Code(s):     --- Professional ---                        [phone], Esophagogastroduodenoscopy, flexible,                         transoral; diagnostic, including collection of  specimen(s) by brushing or washing, when performed                         (separate procedure) Diagnosis Code(s):     --- Professional ---                        K25.9, Gastric ulcer, unspecified as acute or chronic,                         without hemorrhage or perforation                        K25.3, Acute gastric ulcer without hemorrhage or                         perforation                        K25.0, Acute gastric ulcer with hemorrhage CPT copyright 2019 American Medical Association. All rights reserved. The codes documented in this report are preliminary and upon coder review may  be revised to meet current compliance requirements. Dr. Ulyess Mort Lin Landsman MD, MD 11/01/2020 9:17:19 AM This report has been signed electronically. Number of Addenda: 0 Note Initiated On: 11/01/2020 8:56 AM Estimated Blood Loss:  Estimated blood loss: none.      [facility]

## 2020-11-01 NOTE — Transfer of Care (Signed)
2Immediate Anesthesia Transfer of Care Note  Patient: Betty Vaughn  Procedure(s) Performed: ESOPHAGOGASTRODUODENOSCOPY (EGD) WITH PROPOFOL (N/A )  Patient Location: Endoscopy Unit  Anesthesia Type:General  Level of Consciousness: awake and drowsy  Airway & Oxygen Therapy: Patient Spontanous Breathing and Patient connected to face mask oxygen  Post-op Assessment: Report given to RN and Post -op Vital signs reviewed and stable  Post vital signs: Reviewed and stable  Last Vitals:  Vitals Value Taken Time  BP 127/97 11/01/20 0921  Temp 36.1 C 11/01/20 0921  Pulse 90 11/01/20 0923  Resp 31 11/01/20 0923  SpO2 99 % 11/01/20 0923  Vitals shown include unvalidated device data.  Last Pain:  Vitals:   11/01/20 0921  TempSrc: Temporal  PainSc: 0-No pain         Complications: No complications documented.

## 2020-11-01 NOTE — Anesthesia Preprocedure Evaluation (Signed)
Anesthesia Evaluation  Patient identified by MRN, date of birth, ID band Patient awake    Reviewed: Allergy & Precautions, H&P , NPO status , Patient's Chart, lab work & pertinent test results, reviewed documented beta blocker date and time   Airway Mallampati: II   Neck ROM: full    Dental  (+) Poor Dentition, Teeth Intact   Pulmonary neg pulmonary ROS, Current Smoker and Patient abstained from smoking.,    Pulmonary exam normal        Cardiovascular Exercise Tolerance: Good hypertension, On Medications + CAD  Normal cardiovascular exam Rhythm:regular Rate:Normal     Neuro/Psych  Headaches, PSYCHIATRIC DISORDERS Depression  Neuromuscular disease    GI/Hepatic Neg liver ROS, PUD, GERD  Medicated,  Endo/Other  negative endocrine ROSdiabetes  Renal/GU Renal disease  negative genitourinary   Musculoskeletal   Abdominal   Peds  Hematology negative hematology ROS (+)   Anesthesia Other Findings Past Medical History: No date: Allergy No date: Arthritis No date: Chronic insomnia No date: Colon cancer (HCC)     Comment:  polyp was cancerous No date: Depression No date: Diabetes mellitus without complication (HCC) No date: GERD (gastroesophageal reflux disease) No date: Hx of colonic polyps No date: Hyperlipidemia No date: Hypertension No date: Neuromuscular disorder ([place]) No date: Psoriasis     Comment:  scalp Past Surgical History: No date: ABDOMINAL HYSTERECTOMY     Comment:  total 2005: CERVICAL FUSION 01/2018: CERVICAL FUSION     Comment:  C2-T2 posterior laminectomy and fusion; removal of               previous hardware No date: CHOLECYSTECTOMY 06/12/2017: COLONOSCOPY WITH PROPOFOL; N/A     Comment:  Procedure: COLONOSCOPY WITH PROPOFOL;  Surgeon: Lucilla Lame, MD;  Location: [place];  Service:               Gastroenterology;  Laterality: N/A; 2011: CORONARY ANGIOPLASTY      Comment:  "insignifiant CAD" 08/22/2020: ESOPHAGOGASTRODUODENOSCOPY (EGD) WITH PROPOFOL; N/A     Comment:  Procedure: ESOPHAGOGASTRODUODENOSCOPY (EGD) WITH               PROPOFOL;  Surgeon: Lin Landsman, MD;  Location:               [facility] ENDOSCOPY;  Service: Gastroenterology;  Laterality:               N/A; 2001: HAND SURGERY; Bilateral 2000: POLYPECTOMY 06/12/2017: POLYPECTOMY; N/A     Comment:  Procedure: POLYPECTOMY;  Surgeon: Lucilla Lame, MD;                Location: [place];  Service:               Gastroenterology;  Laterality: N/A; No date: rigger finger BMI    Body Mass Index: 27.02 kg/m     Reproductive/Obstetrics negative OB ROS                             Anesthesia Physical Anesthesia Plan  ASA: III  Anesthesia Plan: General   Post-op Pain Management:    Induction:   PONV Risk Score and Plan:   Airway Management Planned:   Additional Equipment:   Intra-op Plan:   Post-operative Plan:   Informed Consent: I have reviewed the patients History and Physical, chart, labs and discussed the procedure including  the risks, benefits and alternatives for the proposed anesthesia with the patient or authorized representative who has indicated his/her understanding and acceptance.     Dental Advisory Given  Plan Discussed with: CRNA  Anesthesia Plan Comments:         Anesthesia Quick Evaluation

## 2020-11-01 NOTE — Anesthesia Procedure Notes (Signed)
Procedure Name: General with mask airway Performed by: Fletcher-Harrison, Arthor Gorter, CRNA Pre-anesthesia Checklist: Patient identified, Emergency Drugs available, Suction available and Patient being monitored Patient Re-evaluated:Patient Re-evaluated prior to induction Oxygen Delivery Method: Simple face mask Induction Type: IV induction Placement Confirmation: positive ETCO2 and CO2 detector Dental Injury: Teeth and Oropharynx as per pre-operative assessment        

## 2020-11-01 NOTE — H&P (Signed)
Betty Darby, MD [address]  [place]  [place], [place] [address]  Main: [phone]  Fax: [phone] Pager: [phone]  Primary Care Physician:  Glean Hess, MD Primary Gastroenterologist:  Dr. Cephas Vaughn  Pre-Procedure History & Physical: HPI:  Betty Vaughn is a 69 y.o. female is here for an endoscopy.   Past Medical History:  Diagnosis Date  . Allergy   . Arthritis   . Chronic insomnia   . Colon cancer ([place])    polyp was cancerous  . Depression   . Diabetes mellitus without complication ([place])   . GERD (gastroesophageal reflux disease)   . Hx of colonic polyps   . Hyperlipidemia   . Hypertension   . Neuromuscular disorder ([place])   . Psoriasis    scalp    Past Surgical History:  Procedure Laterality Date  . ABDOMINAL HYSTERECTOMY     total  . CERVICAL FUSION  2005  . CERVICAL FUSION  01/2018   C2-T2 posterior laminectomy and fusion; removal of previous hardware  . CHOLECYSTECTOMY    . COLONOSCOPY WITH PROPOFOL N/A 06/12/2017   Procedure: COLONOSCOPY WITH PROPOFOL;  Surgeon: Lucilla Lame, MD;  Location: [facility];  Service: Gastroenterology;  Laterality: N/A;  . 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"  . ESOPHAGOGASTRODUODENOSCOPY (EGD) WITH PROPOFOL N/A 08/22/2020   Procedure: ESOPHAGOGASTRODUODENOSCOPY (EGD) WITH PROPOFOL;  Surgeon: Lin Landsman, MD;  Location: [facility] [place] [facility];  Service: Gastroenterology;  Laterality: N/A;  . HAND SURGERY Bilateral 2001  . POLYPECTOMY  2000  . POLYPECTOMY N/A 06/12/2017   Procedure: POLYPECTOMY;  Surgeon: Lucilla Lame, MD;  Location: [place];  Service: Gastroenterology;  Laterality: N/A;  . rigger finger      Prior to Admission medications   Medication Sig Start Date End Date Taking? Authorizing Provider  amLODipine (NORVASC) 5 MG tablet TAKE 1 TABLET BY MOUTH AT BEDTIME 10/23/20  Yes Glean Hess, MD  atorvastatin (LIPITOR) 40 MG tablet TAKE 1 TABLET BY MOUTH  EVERY DAY 10/23/20  Yes Glean Hess, MD  chlorthalidone (HYGROTON) 25 MG tablet TAKE 1 TABLET BY MOUTH EVERY DAY 10/23/20  Yes Glean Hess, MD  DULoxetine (CYMBALTA) 60 MG capsule TAKE 1 CAPSULE(60 MG) BY MOUTH DAILY 09/23/20  Yes Glean Hess, MD  gabapentin (NEURONTIN) 300 MG capsule TAKE 2 CAPSULES(600 MG) BY MOUTH TWICE DAILY 06/25/20  Yes Glean Hess, MD  linaclotide [facility]) 145 MCG CAPS capsule Take 1 capsule (145 mcg total) by mouth daily before breakfast. 10/17/20  Yes Sophie Quiles, Tally Due, MD  methocarbamol (ROBAXIN) 500 MG tablet TAKE 1 TABLET BY MOUTH FOUR TIMES DAILY 10/23/20  Yes Glean Hess, MD  metoprolol tartrate (LOPRESSOR) 50 MG tablet TAKE 1 TABLET BY MOUTH TWICE DAILY 10/23/20  Yes Glean Hess, MD  pantoprazole (PROTONIX) 40 MG tablet Take 1 tablet (40 mg total) by mouth 2 (two) times daily before a meal. 10/04/20 01/02/21 Yes Marcella Dunnaway, Tally Due, MD  tamsulosin (FLOMAX) 0.4 MG CAPS capsule Take 1 capsule (0.4 mg total) by mouth daily. 10/13/20  Yes Glean Hess, MD  traZODone (DESYREL) 50 MG tablet TAKE 1 TABLET(50 MG) BY MOUTH AT BEDTIME 08/24/20  Yes Glean Hess, MD  Accu-Chek FastClix Lancets MISC USE TO TEST BLOOD SUGAR TWICE DAILY 06/19/19   Glean Hess, MD  ACCU-CHEK GUIDE test strip USE TO TEST BLOOD SUGAR TWICE DAILY 06/19/19   Glean Hess, MD  Blood Glucose Monitoring Suppl (ACCU-CHEK GUIDE  ME) w/Device KIT TO TEST TWICE DAILY 06/19/19   Glean Hess, MD  Calcium Carbonate-Vitamin D 600-400 MG-UNIT tablet TAKE 1 TABLET BY MOUTH EVERY DAY 09/25/19   Glean Hess, MD  Multiple Minerals-Vitamins (CALCIUM & VIT D3 BONE HEALTH PO) Take by mouth. 600-400 mg    [provider]  nitroGLYCERIN (NITROSTAT) 0.4 MG SL tablet Place under the tongue.    [provider]  potassium chloride SA (KLOR-CON) 20 MEQ tablet TAKE 1 TABLET BY MOUTH EVERY DAY 04/26/20   Glean Hess, MD  VENTOLIN HFA [phone] Base)  MCG/ACT inhaler INHALE 2 PUFFS INTO THE LUNGS EVERY 6 HOURS AS NEEDED FOR WHEEZING OR SHORTNESS OF BREATH 04/26/19   Glean Hess, MD    Allergies as of 10/04/2020 - Review Complete 10/04/2020  Allergen Reaction Noted  . Azithromycin Swelling 11/10/2016  . Propoxyphene Anaphylaxis 05/13/2015  . Tramadol Anaphylaxis 05/13/2015  . Penicillins Swelling 05/13/2015  . Piroxicam Rash 05/13/2015  . Sertraline hcl Rash 05/13/2015    Family History  Problem Relation Age of Onset  . Breast cancer Mother 74  . Diabetes Mother   . CAD Father   . Heart attack Father   . Diabetes Daughter   . Kidney failure Daughter   . Breast cancer Sister 78  . Sickle cell anemia Other   . Cancer Brother        lung  . Heart attack Sister   . Cancer Sister        ovarian  . Kidney cancer Neg Hx   . Bladder Cancer Neg Hx     Social History   Socioeconomic History  . Marital status: Divorced    Spouse name: Not on file  . Number of children: 3  . Years of education: Not on file  . Highest education level: 12th grade  Occupational History  . Occupation: Retired  Tobacco Use  . Smoking status: Current Every Day Smoker    Packs/day: 0.25    Years: 30.00    Pack years: 7.50    Types: Cigarettes    Last attempt to quit: 03/25/2020    Years since quitting: 0.6  . Smokeless tobacco: Never Used  Vaping Use  . Vaping Use: Every day  Substance and Sexual Activity  . Alcohol use: Yes    Alcohol/week: 1.0 standard drink    Types: 1 Glasses of wine per week  . Drug use: No  . Sexual activity: Not Currently  Other Topics Concern  . Not on file  Social History Narrative  . Not on file   Social Determinants of Health   Financial Resource Strain: High Risk  . Difficulty of Paying Living Expenses: Hard  Food Insecurity: Food Insecurity Present  . Worried About Charity fundraiser in the Last Year: Sometimes true  . Ran Out of Food in the Last Year: Sometimes true  Transportation Needs: No  Transportation Needs  . Lack of Transportation (Medical): No  . Lack of Transportation (Non-Medical): No  Physical Activity: Insufficiently Active  . Days of Exercise per Week: 3 days  . Minutes of Exercise per Session: 20 min  Stress: No Stress Concern Present  . Feeling of Stress : Only a little  Social Connections: Moderately Isolated  . Frequency of Communication with Friends and Family: More than three times a week  . Frequency of Social Gatherings with Friends and Family: Once a week  . Attends Religious Services: More than 4 times per year  .  Active Member of Clubs or Organizations: No  . Attends Archivist Meetings: Never  . Marital Status: Divorced  Human resources officer Violence: Not At Risk  . Fear of Current or Ex-Partner: No  . Emotionally Abused: No  . Physically Abused: No  . Sexually Abused: No    Review of Systems: See HPI, otherwise negative ROS  Physical Exam: BP 117/74   Pulse 75   Temp 97.7 F (36.5 C) (Temporal)   Resp 18   Ht 5' 1"  (1.549 m)   Wt 64.9 kg   LMP  (LMP Unknown)   SpO2 100%   BMI 27.02 kg/m  General:   Alert,  pleasant and cooperative in NAD Head:  Normocephalic and atraumatic. Neck:  Supple; no masses or thyromegaly. Lungs:  Clear throughout to auscultation.    Heart:  Regular rate and rhythm. Abdomen:  Soft, nontender and nondistended. Normal bowel sounds, without guarding, and without rebound.   Neurologic:  Alert and  oriented x4;  grossly normal neurologically.  Impression/Plan: Ruthell Feigenbaum is here for an endoscopy to be performed for h/o gastric ulcer  Risks, benefits, limitations, and alternatives regarding  endoscopy have been reviewed with the patient.  Questions have been answered.  All parties agreeable.   Sherri Sear, MD  11/01/2020, 8:57 AM

## 2020-11-02 ENCOUNTER — Encounter: Payer: Self-pay | Admitting: Gastroenterology

## 2020-11-02 NOTE — Anesthesia Postprocedure Evaluation (Signed)
Anesthesia Post Note  Patient: Betty Vaughn  Procedure(s) Performed: ESOPHAGOGASTRODUODENOSCOPY (EGD) WITH PROPOFOL (N/A )  Patient location during evaluation: PACU Anesthesia Type: General Level of consciousness: awake and alert Pain management: pain level controlled Vital Signs Assessment: post-procedure vital signs reviewed and stable Respiratory status: spontaneous breathing, nonlabored ventilation, respiratory function stable and patient connected to nasal cannula oxygen Cardiovascular status: blood pressure returned to baseline and stable Postop Assessment: no apparent nausea or vomiting Anesthetic complications: no   No complications documented.   Last Vitals:  Vitals:   11/01/20 0936 11/01/20 0946  BP: 106/75 135/85  Pulse: 75 72  Resp: (!) 26 (!) 23  Temp:    SpO2: 98% 95%    Last Pain:  Vitals:   11/02/20 0738  TempSrc:   PainSc: 0-No pain                 Molli Barrows

## 2020-11-22 ENCOUNTER — Other Ambulatory Visit: Payer: Self-pay | Admitting: Internal Medicine

## 2020-11-22 NOTE — Telephone Encounter (Signed)
Requested medication (s) are due for refill today:   Provider to determine  Requested medication (s) are on the active medication list:   Yes  Future visit scheduled:   Yes   Last ordered: 10/23/2020 #120, 0 refills  Non delegated refill   Requested Prescriptions  Pending Prescriptions Disp Refills   methocarbamol (ROBAXIN) 500 MG tablet [Pharmacy Med Name: METHOCARBAMOL 500MG  TABLETS] 120 tablet 0    Sig: TAKE 1 TABLET BY MOUTH FOUR TIMES DAILY      Not Delegated - Analgesics:  Muscle Relaxants Failed - 11/22/2020  8:24 AM      Failed - This refill cannot be delegated      Passed - Valid encounter within last 6 months    Recent Outpatient Visits           1 month ago Annual physical exam   [facility], MD   3 months ago Acute gastric ulcer with perforation [facility])   [facility], MD   5 months ago Primary osteoarthritis of right knee   [facility], MD   8 months ago Glucose intolerance (impaired glucose tolerance)   [facility], MD   1 year ago Annual physical exam   [facility], MD       Future Appointments             In 1 month Vanga, Reubin Milan, MD [facility]   In 4 months Loel Dubonnet, Judithann Graves, MD [facility]   In 10 months [facility], Judithann Graves, MD [place]

## 2020-12-22 ENCOUNTER — Other Ambulatory Visit: Payer: Self-pay | Admitting: Internal Medicine

## 2020-12-22 DIAGNOSIS — G629 Polyneuropathy, unspecified: Secondary | ICD-10-CM

## 2021-01-10 ENCOUNTER — Other Ambulatory Visit: Payer: Self-pay

## 2021-01-10 ENCOUNTER — Ambulatory Visit: Payer: Medicare Other | Admitting: Gastroenterology

## 2021-01-16 ENCOUNTER — Encounter: Payer: Self-pay | Admitting: Internal Medicine

## 2021-01-16 ENCOUNTER — Ambulatory Visit (INDEPENDENT_AMBULATORY_CARE_PROVIDER_SITE_OTHER): Payer: Medicare Other | Admitting: Internal Medicine

## 2021-01-16 ENCOUNTER — Other Ambulatory Visit: Payer: Self-pay

## 2021-01-16 VITALS — BP 132/70 | HR 77 | Ht 61.0 in | Wt 143.0 lb

## 2021-01-16 DIAGNOSIS — M5116 Intervertebral disc disorders with radiculopathy, lumbar region: Secondary | ICD-10-CM | POA: Diagnosis not present

## 2021-01-16 DIAGNOSIS — F17200 Nicotine dependence, unspecified, uncomplicated: Secondary | ICD-10-CM

## 2021-01-16 MED ORDER — ALBUTEROL SULFATE HFA 108 (90 BASE) MCG/ACT IN AERS: 2.0000 | INHALATION_SPRAY | Freq: Four times a day (QID) | RESPIRATORY_TRACT | 1 refills | Status: DC | PRN

## 2021-01-16 MED ORDER — HYDROCODONE-ACETAMINOPHEN 5-325 MG PO TABS: 1.0000 | ORAL_TABLET | Freq: Four times a day (QID) | ORAL | 0 refills | Status: AC | PRN

## 2021-01-16 MED ORDER — PREDNISONE 10 MG PO TABS: ORAL_TABLET | ORAL | 0 refills | Status: AC

## 2021-01-16 NOTE — Progress Notes (Signed)
Date:  01/16/2021   Name:  Betty Vaughn   DOB:  [birth year]   MRN:  [id number]   Chief Complaint: Back Pain (Lower back pain, stiff and trouble walking. Trouble lifting legs when stepping up. ) and Asthma (RF albuterol inhaler. Wheezing when laying down. Her current inhaler is expired. )  Back Pain This is a recurrent problem. The current episode started 1 to 4 weeks ago. The problem occurs constantly. The problem has been gradually worsening since onset. The pain is present in the lumbar spine. The quality of the pain is described as aching and burning. The pain radiates to the left thigh and right thigh. The pain is moderate. The symptoms are aggravated by twisting, standing and coughing. Associated symptoms include leg pain and weakness. Pertinent negatives include no bladder incontinence, bowel incontinence, chest pain, fever, numbness or perianal numbness. Risk factors: MRI lumbar 2019 - DDD.    Lab Results  Component Value Date   CREATININE 0.99 10/13/2020   BUN 13 10/13/2020   NA 139 10/13/2020   K 3.7 10/13/2020   CL 98 10/13/2020   CO2 25 10/13/2020   Lab Results  Component Value Date   CHOL 154 10/13/2020   HDL 40 10/13/2020   LDLCALC 88 10/13/2020   TRIG 146 10/13/2020   CHOLHDL 3.9 10/13/2020   Lab Results  Component Value Date   TSH 2.680 10/13/2020   Lab Results  Component Value Date   HGBA1C 6.2 (H) 10/13/2020   Lab Results  Component Value Date   WBC 7.4 10/13/2020   HGB 13.8 10/13/2020   HCT 40.6 10/13/2020   MCV 91 10/13/2020   PLT 275 10/13/2020   Lab Results  Component Value Date   ALT 17 10/13/2020   AST 19 10/13/2020   ALKPHOS 111 10/13/2020   BILITOT 0.2 10/13/2020     Review of Systems  Constitutional: Negative for chills and fever.  Respiratory: Positive for chest tightness and wheezing. Negative for shortness of breath.   Cardiovascular: Negative for chest pain, palpitations and leg swelling.  Gastrointestinal: Negative for  bowel incontinence.  Genitourinary: Negative for bladder incontinence.  Musculoskeletal: Positive for back pain and myalgias. Negative for joint swelling.  Neurological: Positive for weakness. Negative for dizziness, light-headedness and numbness.    Patient Active Problem List   Diagnosis Date Noted  . PUD (peptic ulcer disease)   . Perforated gastric ulcer ([place]) 08/01/2020  . Myofascial muscle pain 01/22/2019  . Weakness of both arms 09/08/2018  . Renal insufficiency 09/08/2018  . 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 Cervical stenosis of spinal canal 01/14/2018  . Personal history of colonic polyps   . Rectal polyp   . Tobacco use disorder 11/20/2016  . Neuropathy 04/10/2016  . Prediabetes 05/13/2015  . Benign essential tremor 05/13/2015  . Chronic insomnia 05/13/2015  . 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 Multiple joint complaints 05/13/2015  . Depression, major, recurrent, in partial remission ([place]) 02/23/2014  . Essential hypertension 02/23/2014  . Mixed hyperlipidemia 02/23/2014  . Chronic headache disorder 01/16/2012  . 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 CAD S/P percutaneous coronary angioplasty 03/16/2010    Allergies  Allergen Reactions  . Azithromycin Swelling  . Propoxyphene Anaphylaxis  . Tramadol Anaphylaxis  . Penicillins Swelling  . Piroxicam Rash  . Sertraline Hcl Rash    Past Surgical History:  Procedure Laterality Date  . ABDOMINAL HYSTERECTOMY     total  . CERVICAL FUSION  2005  . CERVICAL FUSION  01/2018   C2-T2 posterior laminectomy and fusion; removal of previous hardware  . CHOLECYSTECTOMY    . COLONOSCOPY WITH PROPOFOL N/A 06/12/2017   Procedure: COLONOSCOPY WITH PROPOFOL;  Surgeon: Lucilla Lame, MD;  Location: [facility];  Service: Gastroenterology;  Laterality: N/A;  . 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"  . ESOPHAGOGASTRODUODENOSCOPY (EGD) WITH PROPOFOL N/A 08/22/2020   Procedure:  ESOPHAGOGASTRODUODENOSCOPY (EGD) WITH PROPOFOL;  Surgeon: Lin Landsman, MD;  Location: [place];  Service: Gastroenterology;  Laterality: N/A;  . ESOPHAGOGASTRODUODENOSCOPY (EGD) WITH PROPOFOL N/A 11/01/2020   Procedure: ESOPHAGOGASTRODUODENOSCOPY (EGD) WITH PROPOFOL;  Surgeon: Lin Landsman, MD;  Location: [facility];  Service: Gastroenterology;  Laterality: N/A;  . HAND SURGERY Bilateral 2001  . POLYPECTOMY  2000  . POLYPECTOMY N/A 06/12/2017   Procedure: POLYPECTOMY;  Surgeon: Lucilla Lame, MD;  Location: [place];  Service: Gastroenterology;  Laterality: N/A;  . rigger finger      Social History   Tobacco Use  . Smoking status: Current Every Day Smoker    Packs/day: 0.25    Years: 30.00    Pack years: 7.50    Types: Cigarettes    Last attempt to quit: 03/25/2020    Years since quitting: 0.8  . Smokeless tobacco: Never Used  Vaping Use  . Vaping Use: Every day  Substance Use Topics  . Alcohol use: Yes    Alcohol/week: 1.0 standard drink    Types: 1 Glasses of wine per week  . Drug use: No     Medication list has been reviewed and updated.  Current Meds  Medication Sig  . Accu-Chek FastClix Lancets MISC USE TO TEST BLOOD SUGAR TWICE DAILY  . ACCU-CHEK GUIDE test strip USE TO TEST BLOOD SUGAR TWICE DAILY  . amLODipine (NORVASC) 5 MG tablet TAKE 1 TABLET BY MOUTH AT BEDTIME  . atorvastatin (LIPITOR) 40 MG tablet TAKE 1 TABLET BY MOUTH EVERY DAY  . Blood Glucose Monitoring Suppl (ACCU-CHEK GUIDE ME) w/Device KIT TO TEST TWICE DAILY  . Calcium Carbonate-Vitamin D 600-400 MG-UNIT tablet TAKE 1 TABLET BY MOUTH EVERY DAY  . Calcium Carbonate-Vitamin D3 600-400 MG-UNIT TABS Take 1 tablet by mouth daily.  . chlorthalidone (HYGROTON) 25 MG tablet TAKE 1 TABLET BY MOUTH EVERY DAY  . DULoxetine (CYMBALTA) 60 MG capsule TAKE 1 CAPSULE(60 MG) BY MOUTH DAILY  . gabapentin (NEURONTIN) 300 MG capsule TAKE 2 CAPSULES(600 MG) BY MOUTH TWICE DAILY  .  linaclotide (LINZESS) 145 MCG CAPS capsule Take 1 capsule (145 mcg total) by mouth daily before breakfast.  . meloxicam (MOBIC) 15 MG tablet Take 15 mg by mouth daily.  . methocarbamol (ROBAXIN) 500 MG tablet TAKE 1 TABLET BY MOUTH FOUR TIMES DAILY  . metoprolol tartrate (LOPRESSOR) 50 MG tablet TAKE 1 TABLET BY MOUTH TWICE DAILY  . Multiple Minerals-Vitamins (CALCIUM & VIT D3 BONE HEALTH PO) Take by mouth. 600-400 mg  . nitroGLYCERIN (NITROSTAT) 0.4 MG SL tablet Place under the tongue.  Marland Kitchen omeprazole (PRILOSEC) 20 MG capsule Take 20 mg by mouth 2 (two) times daily.  . potassium chloride SA (KLOR-CON) 20 MEQ tablet TAKE 1 TABLET BY MOUTH EVERY DAY  . tamsulosin (FLOMAX) 0.4 MG CAPS capsule Take 1 capsule (0.4 mg total) by mouth daily.  . traZODone (DESYREL) 50 MG tablet TAKE 1 TABLET(50 MG) BY MOUTH AT BEDTIME  . VENTOLIN HFA 108 (90 Base) MCG/ACT inhaler INHALE 2 PUFFS INTO THE LUNGS EVERY 6 HOURS AS NEEDED FOR WHEEZING OR SHORTNESS OF BREATH  PHQ 2/9 Scores 01/16/2021 10/13/2020 06/21/2020 06/07/2020  PHQ - 2 Score 4 0 2 6  PHQ- 9 Score 9 0 8 17    GAD 7 : Generalized Anxiety Score 01/16/2021 10/13/2020 06/07/2020 03/09/2020  Nervous, Anxious, on Edge 2 0 2 3  Control/stop worrying 2 0 3 0  Worry too much - different things 2 0 3 0  Trouble relaxing 1 0 1 1  Restless 1 0 0 3  Easily annoyed or irritable 1 0 3 2  Afraid - awful might happen 0 0 0 2  Total GAD 7 Score 9 0 12 11  Anxiety Difficulty Not difficult at all Not difficult at all Not difficult at all Somewhat difficult    BP Readings from Last 3 Encounters:  01/16/21 132/70  11/01/20 135/85  10/13/20 128/80    Physical Exam Constitutional:      General: She is in acute distress.  Cardiovascular:     Rate and Rhythm: Normal rate and regular rhythm.     Pulses: Normal pulses.  Pulmonary:     Breath sounds: No stridor. Wheezing present. No rhonchi.  Musculoskeletal:     Lumbar back: Tenderness present. Positive right  straight leg raise test and positive left straight leg raise test.     Right lower leg: No edema.     Left lower leg: No edema.  Neurological:     Sensory: Sensation is intact.     Gait: Gait abnormal.     Deep Tendon Reflexes:     Reflex Scores:      Patellar reflexes are 1+ on the right side and 1+ on the left side.    Wt Readings from Last 3 Encounters:  01/16/21 143 lb (64.9 kg)  11/01/20 143 lb (64.9 kg)  10/13/20 142 lb (64.4 kg)    BP 132/70   Pulse 77   Ht _0  (1.549 m)   Wt 143 lb (64.9 kg)   LMP  (LMP Unknown)   SpO2 98%   BMI 27.02 kg/m   Assessment and Plan: 1. Lumbar disc herniation with radiculopathy Worsening symptoms - will treat with steroid taper, pain medication and refer to Ortho - Ambulatory referral to Orthopedic Surgery - predniSONE (DELTASONE) 10 MG tablet; Take 6 on day 1and 2, 5 on day 3 and 4, 4 on day 5 and 6 , 3 on day 7 and 8, 2 on day 9 and 10 and 1 on day 11 and 12 then stop.  Dispense: 42 tablet; Refill: 0 - HYDROcodone-acetaminophen (NORCO/VICODIN) 5-325 MG tablet; Take 1 tablet by mouth every 6 (six) hours as needed for up to 5 days for moderate pain.  Dispense: 20 tablet; Refill: 0  2. Tobacco use disorder - albuterol (VENTOLIN HFA) 108 (90 Base) MCG/ACT inhaler; Inhale 2 puffs into the lungs every 6 (six) hours as needed for wheezing or shortness of breath.  Dispense: 18 g; Refill: 1   Partially dictated using Editor, commissioning. Any errors are unintentional.  Halina Maidens, MD [facility]  01/16/2021

## 2021-01-22 ENCOUNTER — Telehealth: Payer: Self-pay

## 2021-01-22 ENCOUNTER — Ambulatory Visit: Payer: Self-pay | Admitting: *Deleted

## 2021-01-22 DIAGNOSIS — M5416 Radiculopathy, lumbar region: Secondary | ICD-10-CM | POA: Diagnosis not present

## 2021-01-22 NOTE — Telephone Encounter (Signed)
Called pt told her to go to the ER for SOB. Pt verbalized understanding. Pt also has appt scheduled for 01/25/2021.  KP

## 2021-01-22 NOTE — Telephone Encounter (Signed)
Pt needs to go to ER for SOB but pt needs an appt to quit smoking. Pt needs an appt before we can give her patches to stop smoking. Please schedule appt.  KP

## 2021-01-22 NOTE — Telephone Encounter (Signed)
Patient will be coming in this Thursday for patch refills on smoking.

## 2021-01-22 NOTE — Telephone Encounter (Signed)
Copied from [place] [phone]. Topic: General - Inquiry >> Jan 22, 2021  2:49 PM Betty Vaughn wrote: Reason for CRM: Pt would like Dr. Gaspar Cola nurse to give her a call back about her inhaler that she was prescribed last week.  She is wanting to know if she should be using a nebulizer.  The inhaler doesn't seem to be helping that much  CB#  [phone]

## 2021-01-22 NOTE — Telephone Encounter (Signed)
Pt called with complaints of shortness of breath that started 1 month ago; she saw her PCP 6 days ago; she was given an albuterol inhaler that was supposed to last for 6 hours but "it is not holding her"; her daughter has an "old breathing treatment pump" and she can use that instead; the pt says her shortness of breath is worse with exertion; she says she moves slowly because of her shortness of breath; this triage nurse can hear the pt's shortness of breath and wheezing; she is also coughing frequently; recommendations made per nurse triage protocol; she verbalized understanding and will go to the ED; the pt would also like to know about getting smoking patches; the pt is seen by Dr [name], [place]; will route to office for notification  Reason for Disposition . [1] MODERATE difficulty breathing (e.g., speaks in phrases, SOB even at rest, pulse 100-120) AND [2] NEW-onset or WORSE than normal  Answer Assessment - Initial Assessment Questions 1. RESPIRATORY STATUS: "Describe your breathing?" (e.g., wheezing, shortness of breath, unable to speak, severe coughing)      Shortness of breath 2. ONSET: "When did this breathing problem begin?"     Visit 01/16/21 3. PATTERN "Does the difficult breathing come and go, or has it been constant since it started?"     constant 4. SEVERITY: "How bad is your breathing?" (e.g., mild, moderate, severe)    - MILD: No SOB at rest, mild SOB with walking, speaks normally in sentences, can lay down, no retractions, pulse < 100.    - MODERATE: SOB at rest, SOB with minimal exertion and prefers to sit, cannot lie down flat, speaks in phrases, mild retractions, audible wheezing, pulse 100-120.    - SEVERE: Very SOB at rest, speaks in single words, struggling to breathe, sitting hunched forward, retractions, pulse > 120     Moderate to severe 5. RECURRENT SYMPTOM: "Have you had difficulty breathing before?" If Yes, ask: "When was the last time?" and "What happened  that time?"      Yes, ongoing 6. CARDIAC HISTORY: "Do you have any history of heart disease?" (e.g., heart attack, angina, bypass surgery, angioplasty)     Yes heart disease 7. LUNG HISTORY: "Do you have any history of lung disease?"  (e.g., pulmonary embolus, asthma, emphysema)     Bronchitis, smoker 8. CAUSE: "What do you think is causing the breathing problem?"     Not sure, ? activity 9. OTHER SYMPTOMS: "Do you have any other symptoms? (e.g., dizziness, runny nose, cough, chest pain, fever)     Cough, runny nose 10. PREGNANCY: "Is there any chance you are pregnant?" "When was your last menstrual period?"    no 11. TRAVEL: "Have you traveled out of the country in the last month?" (e.g., travel history, exposures)      no  Protocols used: BREATHING DIFFICULTY-A-AH

## 2021-01-24 DIAGNOSIS — M5416 Radiculopathy, lumbar region: Secondary | ICD-10-CM | POA: Diagnosis not present

## 2021-01-25 ENCOUNTER — Ambulatory Visit (INDEPENDENT_AMBULATORY_CARE_PROVIDER_SITE_OTHER): Payer: Medicare Other | Admitting: Internal Medicine

## 2021-01-25 ENCOUNTER — Other Ambulatory Visit: Payer: Self-pay

## 2021-01-25 ENCOUNTER — Encounter: Payer: Self-pay | Admitting: Internal Medicine

## 2021-01-25 VITALS — BP 138/76 | HR 60 | Temp 98.7°F | Ht 61.0 in | Wt 143.0 lb

## 2021-01-25 DIAGNOSIS — J3089 Other allergic rhinitis: Secondary | ICD-10-CM

## 2021-01-25 DIAGNOSIS — F17200 Nicotine dependence, unspecified, uncomplicated: Secondary | ICD-10-CM | POA: Diagnosis not present

## 2021-01-25 MED ORDER — NICOTINE POLACRILEX 4 MG MT LOZG: 4.0000 mg | LOZENGE | OROMUCOSAL | 2 refills | Status: AC | PRN

## 2021-01-25 MED ORDER — LORATADINE 10 MG PO TABS: 10.0000 mg | ORAL_TABLET | Freq: Every day | ORAL | 1 refills | Status: DC

## 2021-01-25 NOTE — Progress Notes (Signed)
Date:  01/25/2021   Name:  Betty Vaughn   DOB:  03-14-1951   MRN:  [id number]   Chief Complaint: Nicotine Dependence (Smoking patches, pt still smokes, smoked 10 cig. Since Monday, hears rattling at night in chest )  Nicotine Dependence Presents for follow-up visit. Her urge triggers include company of smokers. The symptoms have been stable. Her first smoke is from 8 to 10 AM. She smokes < 1/2 a pack (she only smokes a few per day but really craves one with her morning coffee) of cigarettes per day.   Allergies - has a little dog now and this might be making her sx worse.  She used to take claritin but it become too costly over the counter.  Lab Results  Component Value Date   CREATININE 0.99 10/13/2020   BUN 13 10/13/2020   NA 139 10/13/2020   K 3.7 10/13/2020   CL 98 10/13/2020   CO2 25 10/13/2020   Lab Results  Component Value Date   CHOL 154 10/13/2020   HDL 40 10/13/2020   LDLCALC 88 10/13/2020   TRIG 146 10/13/2020   CHOLHDL 3.9 10/13/2020   Lab Results  Component Value Date   TSH 2.680 10/13/2020   Lab Results  Component Value Date   HGBA1C 6.2 (H) 10/13/2020   Lab Results  Component Value Date   WBC 7.4 10/13/2020   HGB 13.8 10/13/2020   HCT 40.6 10/13/2020   MCV 91 10/13/2020   PLT 275 10/13/2020   Lab Results  Component Value Date   ALT 17 10/13/2020   AST 19 10/13/2020   ALKPHOS 111 10/13/2020   BILITOT 0.2 10/13/2020     Review of Systems  Constitutional: Negative for unexpected weight change.  HENT: Positive for postnasal drip. Negative for congestion.   Respiratory: Negative for cough, chest tightness, shortness of breath and wheezing.   Allergic/Immunologic: Positive for environmental allergies.  Psychiatric/Behavioral: Negative for dysphoric mood. The patient is not nervous/anxious.     Patient Active Problem List   Diagnosis Date Noted  . PUD (peptic ulcer disease)   . Perforated gastric ulcer ([place]) 08/01/2020  . Myofascial  muscle pain 01/22/2019  . Weakness of both arms 09/08/2018  . Renal insufficiency 09/08/2018  . 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 Cervical stenosis of spinal canal 01/14/2018  . Personal history of colonic polyps   . Rectal polyp   . Tobacco use disorder 11/20/2016  . Neuropathy 04/10/2016  . Prediabetes 05/13/2015  . Benign essential tremor 05/13/2015  . Chronic insomnia 05/13/2015  . 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 Multiple joint complaints 05/13/2015  . Depression, major, recurrent, in partial remission ([place]) 02/23/2014  . Essential hypertension 02/23/2014  . Mixed hyperlipidemia 02/23/2014  . Chronic headache disorder 01/16/2012  . 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 CAD S/P percutaneous coronary angioplasty 03/16/2010    Allergies  Allergen Reactions  . Azithromycin Swelling  . Propoxyphene Anaphylaxis  . Tramadol Anaphylaxis  . Penicillins Swelling  . Piroxicam Rash  . Sertraline Hcl Rash    Past Surgical History:  Procedure Laterality Date  . ABDOMINAL HYSTERECTOMY     total  . CERVICAL FUSION  2005  . CERVICAL FUSION  01/2018   C2-T2 posterior laminectomy and fusion; removal of previous hardware  . CHOLECYSTECTOMY    . COLONOSCOPY WITH PROPOFOL N/A 06/12/2017   Procedure: COLONOSCOPY WITH PROPOFOL;  Surgeon: Lucilla Lame, MD;  Location: [facility];  Service: Gastroenterology;  Laterality: N/A;  .  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"  . ESOPHAGOGASTRODUODENOSCOPY (EGD) WITH PROPOFOL N/A 08/22/2020   Procedure: ESOPHAGOGASTRODUODENOSCOPY (EGD) WITH PROPOFOL;  Surgeon: Lin Landsman, MD;  Location: [place] [facility];  Service: Gastroenterology;  Laterality: N/A;  . ESOPHAGOGASTRODUODENOSCOPY (EGD) WITH PROPOFOL N/A 11/01/2020   Procedure: ESOPHAGOGASTRODUODENOSCOPY (EGD) WITH PROPOFOL;  Surgeon: Lin Landsman, MD;  Location: [facility];  Service: Gastroenterology;  Laterality: N/A;  . HAND SURGERY Bilateral 2001   . POLYPECTOMY  2000  . POLYPECTOMY N/A 06/12/2017   Procedure: POLYPECTOMY;  Surgeon: Lucilla Lame, MD;  Location: [place];  Service: Gastroenterology;  Laterality: N/A;  . rigger finger      Social History   Tobacco Use  . Smoking status: Current Some Day Smoker    Packs/day: 0.25    Years: 30.00    Pack years: 7.50    Types: Cigarettes  . Smokeless tobacco: Never Used  Vaping Use  . Vaping Use: Every day  Substance Use Topics  . Alcohol use: Yes    Alcohol/week: 1.0 standard drink    Types: 1 Glasses of wine per week  . Drug use: No     Medication list has been reviewed and updated.  Current Meds  Medication Sig  . Accu-Chek FastClix Lancets MISC USE TO TEST BLOOD SUGAR TWICE DAILY  . ACCU-CHEK GUIDE test strip USE TO TEST BLOOD SUGAR TWICE DAILY  . albuterol (VENTOLIN HFA) 108 (90 Base) MCG/ACT inhaler Inhale 2 puffs into the lungs every 6 (six) hours as needed for wheezing or shortness of breath.  Marland Kitchen amLODipine (NORVASC) 5 MG tablet TAKE 1 TABLET BY MOUTH AT BEDTIME  . atorvastatin (LIPITOR) 40 MG tablet TAKE 1 TABLET BY MOUTH EVERY DAY  . Blood Glucose Monitoring Suppl (ACCU-CHEK GUIDE ME) w/Device KIT TO TEST TWICE DAILY  . Calcium Carbonate-Vitamin D 600-400 MG-UNIT tablet TAKE 1 TABLET BY MOUTH EVERY DAY  . chlorthalidone (HYGROTON) 25 MG tablet TAKE 1 TABLET BY MOUTH EVERY DAY  . DULoxetine (CYMBALTA) 60 MG capsule TAKE 1 CAPSULE(60 MG) BY MOUTH DAILY  . gabapentin (NEURONTIN) 300 MG capsule TAKE 2 CAPSULES(600 MG) BY MOUTH TWICE DAILY  . linaclotide (LINZESS) 145 MCG CAPS capsule Take 1 capsule (145 mcg total) by mouth daily before breakfast.  . methocarbamol (ROBAXIN) 500 MG tablet TAKE 1 TABLET BY MOUTH FOUR TIMES DAILY  . metoprolol tartrate (LOPRESSOR) 50 MG tablet TAKE 1 TABLET BY MOUTH TWICE DAILY  . Multiple Minerals-Vitamins (CALCIUM & VIT D3 BONE HEALTH PO) Take by mouth. 600-400 mg  . nitroGLYCERIN (NITROSTAT) 0.4 MG SL tablet Place under the  tongue.  Marland Kitchen omeprazole (PRILOSEC) 20 MG capsule Take 20 mg by mouth 2 (two) times daily.  . pantoprazole (PROTONIX) 40 MG tablet Take 1 tablet (40 mg total) by mouth 2 (two) times daily before a meal.  . potassium chloride SA (KLOR-CON) 20 MEQ tablet TAKE 1 TABLET BY MOUTH EVERY DAY  . predniSONE (DELTASONE) 10 MG tablet Take 6 on day 1and 2, 5 on day 3 and 4, 4 on day 5 and 6 , 3 on day 7 and 8, 2 on day 9 and 10 and 1 on day 11 and 12 then stop.  . tamsulosin (FLOMAX) 0.4 MG CAPS capsule Take 1 capsule (0.4 mg total) by mouth daily.  . traZODone (DESYREL) 50 MG tablet TAKE 1 TABLET(50 MG) BY MOUTH AT BEDTIME    PHQ 2/9 Scores 01/25/2021 01/16/2021 10/13/2020 06/21/2020  PHQ - 2 Score 0 4 0 2  PHQ- 9 Score 3 9 0 8    GAD 7 : Generalized Anxiety Score 01/25/2021 01/16/2021 10/13/2020 06/07/2020  Nervous, Anxious, on Edge 1 2 0 2  Control/stop worrying 1 2 0 3  Worry too much - different things 1 2 0 3  Trouble relaxing 0 1 0 1  Restless 0 1 0 0  Easily annoyed or irritable 0 1 0 3  Afraid - awful might happen 0 0 0 0  Total GAD 7 Score 3 9 0 12  Anxiety Difficulty - Not difficult at all Not difficult at all Not difficult at all    BP Readings from Last 3 Encounters:  01/25/21 138/76  01/16/21 132/70  11/01/20 135/85    Physical Exam Vitals and nursing note reviewed.  Constitutional:      General: She is not in acute distress.    Appearance: She is well-developed.  HENT:     Head: Normocephalic and atraumatic.  Cardiovascular:     Rate and Rhythm: Normal rate and regular rhythm.  Pulmonary:     Effort: Pulmonary effort is normal. No respiratory distress.     Breath sounds: Normal breath sounds. No wheezing or rhonchi.  Musculoskeletal:     Right lower leg: No edema.     Left lower leg: No edema.  Skin:    General: Skin is warm and dry.     Findings: No rash.  Neurological:     Mental Status: She is alert and oriented to person, place, and time.  Psychiatric:        Mood and  Affect: Mood normal.        Behavior: Behavior normal.     Wt Readings from Last 3 Encounters:  01/25/21 143 lb (64.9 kg)  01/16/21 143 lb (64.9 kg)  11/01/20 143 lb (64.9 kg)    BP 138/76   Pulse 60   Temp 98.7 F (37.1 C) (Oral)   Ht _0  (1.549 m)   Wt 143 lb (64.9 kg)   LMP  (LMP Unknown)   SpO2 100%   BMI 27.02 kg/m   Assessment and Plan: 1. Tobacco use disorder Options discussed - as few as she smokes, she would probably do best with a lozenge that she can use as needed - nicotine polacrilex (COMMIT) 4 MG lozenge; Take 1 lozenge (4 mg total) by mouth as needed for smoking cessation.  Dispense: 27 lozenge; Refill: 2  2. Environmental and seasonal allergies May be triggered partly by her new dog Recommend trying Claritin daily - will send Rx for cost savings - loratadine (CLARITIN) 10 MG tablet; Take 1 tablet (10 mg total) by mouth daily.  Dispense: 90 tablet; Refill: 1   Partially dictated using Editor, commissioning. Any errors are unintentional.  Halina Maidens, MD [facility]  01/25/2021

## 2021-02-02 DIAGNOSIS — M5416 Radiculopathy, lumbar region: Secondary | ICD-10-CM | POA: Diagnosis not present

## 2021-02-07 DIAGNOSIS — E119 Type 2 diabetes mellitus without complications: Secondary | ICD-10-CM | POA: Diagnosis not present

## 2021-02-07 DIAGNOSIS — H5213 Myopia, bilateral: Secondary | ICD-10-CM | POA: Diagnosis not present

## 2021-02-07 LAB — HM DIABETES EYE EXAM

## 2021-02-12 ENCOUNTER — Encounter: Payer: Self-pay | Admitting: Internal Medicine

## 2021-02-14 DIAGNOSIS — M5416 Radiculopathy, lumbar region: Secondary | ICD-10-CM | POA: Diagnosis not present

## 2021-02-20 ENCOUNTER — Other Ambulatory Visit: Payer: Self-pay | Admitting: Internal Medicine

## 2021-02-20 DIAGNOSIS — F5104 Psychophysiologic insomnia: Secondary | ICD-10-CM

## 2021-02-20 DIAGNOSIS — M4802 Spinal stenosis, cervical region: Secondary | ICD-10-CM

## 2021-02-20 NOTE — Telephone Encounter (Signed)
Requested medications are due for refill today yes  Requested medications are on the active medication list yes  Last refill 11/22/20  Last visit 02/2020  Future visit scheduled 03/29/21  Notes to clinic Historical Provider (Sorry, forgot to attach rx on previous CRM.) Omeprazole

## 2021-02-20 NOTE — Telephone Encounter (Signed)
Med refill

## 2021-02-20 NOTE — Telephone Encounter (Signed)
Requested medications are due for refill today yes  Requested medications are on the active medication list yes  Last refill 11/22/20  Last visit 02/2020  Future visit scheduled 03/29/21  Notes to clinic Historical Provider

## 2021-02-27 ENCOUNTER — Telehealth: Payer: Self-pay

## 2021-02-27 NOTE — Telephone Encounter (Signed)
Spoke with pt. Wrote out a Ecologist paper. Placed up front for pick up.

## 2021-02-27 NOTE — Telephone Encounter (Unsigned)
Copied from [place] [phone]. Topic: General - Other >> Feb 27, 2021 10:09 AM Keene Breath wrote: Reason for CRM: Patient would like the nurse to call her regarding a letter she received for a handicap sticker.  Please call patient to advise her on how to fill out the form.  Patient would like assistance.  CB# [phone]

## 2021-03-02 ENCOUNTER — Ambulatory Visit: Payer: Self-pay | Admitting: *Deleted

## 2021-03-02 ENCOUNTER — Other Ambulatory Visit: Payer: Self-pay | Admitting: Internal Medicine

## 2021-03-02 DIAGNOSIS — R059 Cough, unspecified: Secondary | ICD-10-CM

## 2021-03-02 MED ORDER — DOXYCYCLINE HYCLATE 100 MG PO TABS: 100.0000 mg | ORAL_TABLET | Freq: Two times a day (BID) | ORAL | 0 refills | Status: AC

## 2021-03-02 MED ORDER — PROMETHAZINE-DM 6.25-15 MG/5ML PO SYRP: 5.0000 mL | ORAL_SOLUTION | Freq: Four times a day (QID) | ORAL | 0 refills | Status: DC | PRN

## 2021-03-02 NOTE — Telephone Encounter (Signed)
Pt called stating her cough is getting worse; she is coughing and gagging mucus; she says her cough is so bad "her private part" hurts and she is incontinent of urine and stool when she coughs; she says her cough has been ongoing since 01/25/21 office visit; her secretions are white and "have a nasty taste"; her children have been giving her medicine that makes her sleepy; recommendations made per nurse triage protocol; decision tree completed; the pt is seen by Dr [name]; spoke with Estill Bamberg in reference to scheduling pt; she requests this be sent to office for review and they will contact the pt; pt notified and can be contacted at [phone]; will route per request  Reason for Disposition . SEVERE coughing spells (e.g., whooping sound after coughing, vomiting after coughing)  Answer Assessment - Initial Assessment Questions 1. ONSET: "When did the cough begin?"      Ongoing since 01/25/21 2. SEVERITY: "How bad is the cough today?"     severe 3. SPUTUM: "Describe the color of your sputum" (none, dry cough; clear, white, yellow, green)     white 4. HEMOPTYSIS: "Are you coughing up any blood?" If so ask: "How much?" (flecks, streaks, tablespoons, etc.)     no 5. DIFFICULTY BREATHING: "Are you having difficulty breathing?" If Yes, ask: "How bad is it?" (e.g., mild, moderate, severe)    - MILD: No SOB at rest, mild SOB with walking, speaks normally in sentences, can lay down, no retractions, pulse < 100.    - MODERATE: SOB at rest, SOB with minimal exertion and prefers to sit, cannot lie down flat, speaks in phrases, mild retractions, audible wheezing, pulse 100-120.    - SEVERE: Very SOB at rest, speaks in single words, struggling to breathe, sitting hunched forward, retractions, pulse > 120     no 6. FEVER: "Do you have a fever?" If Yes, ask: "What is your temperature, how was it measured, and when did it start?"    no 7. CARDIAC HISTORY: "Do you have any history of heart disease?" (e.g., heart  attack, congestive heart failure)    htn 8. LUNG HISTORY: "Do you have any history of lung disease?"  (e.g., pulmonary embolus, asthma, emphysema)    bronchitis 9. PE RISK FACTORS: "Do you have a history of blood clots?" (or: recent major surgery, recent prolonged travel, bedridden)    Epidural injection due to leg pain 10. OTHER SYMPTOMS: "Do you have any other symptoms?" (e.g., runny nose, wheezing, chest pain)      Runny nose; rattling in chest due to mucus 11. PREGNANCY: "Is there any chance you are pregnant?" "When was your last menstrual period?"       no 12. TRAVEL: "Have you traveled out of the country in the last month?" (e.g., travel history, exposures)     no  Protocols used: [place]

## 2021-03-02 NOTE — Telephone Encounter (Signed)
Patient informed abx were sent to [facility] st pre Dr [name]. Told her to call  and make an appt if she does not improve.

## 2021-03-05 ENCOUNTER — Other Ambulatory Visit: Payer: Self-pay

## 2021-03-05 ENCOUNTER — Ambulatory Visit (INDEPENDENT_AMBULATORY_CARE_PROVIDER_SITE_OTHER): Payer: Medicare Other | Admitting: Gastroenterology

## 2021-03-05 ENCOUNTER — Encounter: Payer: Self-pay | Admitting: Gastroenterology

## 2021-03-05 VITALS — BP 131/85 | HR 96 | Temp 98.1°F | Ht 61.0 in | Wt 138.5 lb

## 2021-03-05 DIAGNOSIS — K5909 Other constipation: Secondary | ICD-10-CM

## 2021-03-05 NOTE — Progress Notes (Signed)
Cephas Darby, MD [address]  [place], [place] [address]  Main: [phone]  Fax: [phone]    Gastroenterology Consultation  Referring Provider:     Glean Hess, MD Primary Care Physician:  Glean Hess, MD Primary Gastroenterologist:  Dr. Cephas Darby Reason for Consultation:     Peptic ulcer disease, chronic constipation        HPI:   Betty Vaughn is a 69 y.o. female referred by Dr. [name], [name], MD  for consultation & management of peptic ulcer disease.  Patient was admitted to [facility] in early September 2021 secondary to abdominal pain, found to have gastric ulcer with impending perforation on the CT scan.  Patient was treated conservatively with antibiotics and acid suppressive therapy.  Inpatient upper endoscopy was deferred at that time.  Subsequently, patient underwent upper endoscopy on 08/22/2020, 3 weeks later as outpatient.  She reported significant improvement in the pain since discharge.  Found to have cratered, clean-based gastric ulcer.  There was no evidence of H. pylori.  The etiology of ulcer was thought to be secondary to chronic NSAID use.  Patient does have mild anemia, without any iron deficiency.  She ran out of Protonix 40 mg about a month ago.  She said she called our office several times for refills.  Patient is also going through a lot of stress lately as her ex-husband passed away.  She does report upper abdominal discomfort, abdominal bloating as well as irregular bowel habits.  She does have history of chronic constipation, since her childhood.  She reports that she has been eating more fiber, water.  She takes Senokot and laxative as needed which does not help anymore.  She said, MiraLAX did not help her in the past when she was severely constipated after her spine surgery.  She had history of tobacco use, 7.5 pack years, quit approximately in summer 2021, restarted smoking due to family stress.  Denies heavy alcohol  use.  NSAIDs: Mobic daily, stopped since diagnosis of peptic ulcer disease  Antiplts/Anticoagulants/Anti thrombotics: Aspirin 81 mg  Follow-up visit 03/05/21 Patient reports that [place] has resulted in diarrhea if she is taking daily.  Therefore switched to to every other day which keeps her bowel movements regular.  She had upper endoscopy which confirmed significant healing of the ulcer.  She is taking omeprazole 20 mg daily for heartburn.  She had severe COPD exacerbation, has not smoked in last 2 weeks.  She does not have any other GI concerns today  GI Procedures: Upper endoscopy July 21 - Normal duodenal bulb and second portion of the duodenum. - Non-bleeding gastric ulcer with a clean ulcer base (Forrest Class III), significantly healed. - The examination was otherwise normal. - Normal gastroesophageal junction and esophagus. - No specimens collected.   Colonoscopy 06/12/2017 by Dr. Allen Norris - One 10 mm polyp in the rectum, removed with a cold snare. Resected and retrieved. - Two 2 to 3 mm polyps in the rectum, removed with a cold biopsy forceps. Resected and retrieved. - Non-bleeding internal hemorrhoids. Pathology showed tubular adenoma  Upper endoscopy 08/22/2020 - Normal duodenal bulb and second portion of the duodenum. - Non-bleeding gastric ulcer with a clean ulcer base (Forrest Class III). - Erythematous mucosa in the antrum. Biopsied. - Normal gastric body and incisura. Biopsied. - Normal gastroesophageal junction and esophagus.  DIAGNOSIS:  A. STOMACH ULCER; COLD BIOPSY:  - REACTIVE GASTRITIS CONSISTENT WITH PATIENT'S HISTORY OF PEPTIC ULCER  DISEASE.  - NEGATIVE FOR H. PYLORI, DYSPLASIA, AND MALIGNANCY.    Past Medical History:  Diagnosis Date  . Allergy   . Arthritis   . Chronic insomnia   . Colon cancer ([place])    polyp was cancerous  . Depression   . Diabetes mellitus without complication ([place])   . GERD (gastroesophageal reflux disease)   . Hx of  colonic polyps   . Hyperlipidemia   . Hypertension   . Neuromuscular disorder ([place])   . Psoriasis    scalp    Past Surgical History:  Procedure Laterality Date  . ABDOMINAL HYSTERECTOMY     total  . CERVICAL FUSION  2005  . CERVICAL FUSION  01/2018   C2-T2 posterior laminectomy and fusion; removal of previous hardware  . CHOLECYSTECTOMY    . COLONOSCOPY WITH PROPOFOL N/A 06/12/2017   Procedure: COLONOSCOPY WITH PROPOFOL;  Surgeon: Lucilla Lame, MD;  Location: [facility];  Service: Gastroenterology;  Laterality: N/A;  . 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"  . ESOPHAGOGASTRODUODENOSCOPY (EGD) WITH PROPOFOL N/A 08/22/2020   Procedure: ESOPHAGOGASTRODUODENOSCOPY (EGD) WITH PROPOFOL;  Surgeon: Lin Landsman, MD;  Location: [facility];  Service: Gastroenterology;  Laterality: N/A;  . ESOPHAGOGASTRODUODENOSCOPY (EGD) WITH PROPOFOL N/A 11/01/2020   Procedure: ESOPHAGOGASTRODUODENOSCOPY (EGD) WITH PROPOFOL;  Surgeon: Lin Landsman, MD;  Location: [facility] ENDOSCOPY;  Service: Gastroenterology;  Laterality: N/A;  . HAND SURGERY Bilateral 2001  . POLYPECTOMY  2000  . POLYPECTOMY N/A 06/12/2017   Procedure: POLYPECTOMY;  Surgeon: Lucilla Lame, MD;  Location: [facility];  Service: Gastroenterology;  Laterality: N/A;  . rigger finger      Current Outpatient Medications:  .  Accu-Chek FastClix Lancets MISC, USE TO TEST BLOOD SUGAR TWICE DAILY, Disp: 102 each, Rfl: 5 .  ACCU-CHEK GUIDE test strip, USE TO TEST BLOOD SUGAR TWICE DAILY, Disp: 100 strip, Rfl: 5 .  albuterol (VENTOLIN HFA) 108 (90 Base) MCG/ACT inhaler, Inhale 2 puffs into the lungs every 6 (six) hours as needed for wheezing or shortness of breath., Disp: 18 g, Rfl: 1 .  amLODipine (NORVASC) 5 MG tablet, TAKE 1 TABLET BY MOUTH AT BEDTIME, Disp: 90 tablet, Rfl: 1 .  atorvastatin (LIPITOR) 40 MG tablet, TAKE 1 TABLET BY MOUTH EVERY DAY, Disp: 90 tablet, Rfl: 1 .  Blood Glucose Monitoring Suppl  (ACCU-CHEK GUIDE ME) w/Device KIT, TO TEST TWICE DAILY, Disp: 1 kit, Rfl: 0 .  Calcium Carbonate-Vitamin D 600-400 MG-UNIT tablet, TAKE 1 TABLET BY MOUTH EVERY DAY, Disp: 30 tablet, Rfl: 5 .  chlorthalidone (HYGROTON) 25 MG tablet, TAKE 1 TABLET BY MOUTH EVERY DAY, Disp: 90 tablet, Rfl: 1 .  doxycycline (VIBRA-TABS) 100 MG tablet, Take 1 tablet (100 mg total) by mouth 2 (two) times daily for 10 days., Disp: 20 tablet, Rfl: 0 .  DULoxetine (CYMBALTA) 60 MG capsule, TAKE 1 CAPSULE(60 MG) BY MOUTH DAILY, Disp: 90 capsule, Rfl: 0 .  gabapentin (NEURONTIN) 300 MG capsule, TAKE 2 CAPSULES(600 MG) BY MOUTH TWICE DAILY, Disp: 360 capsule, Rfl: 0 .  linaclotide (LINZESS) 145 MCG CAPS capsule, Take 1 capsule (145 mcg total) by mouth daily before breakfast., Disp: 30 capsule, Rfl: 2 .  loratadine (CLARITIN) 10 MG tablet, Take 1 tablet (10 mg total) by mouth daily., Disp: 90 tablet, Rfl: 1 .  methocarbamol (ROBAXIN) 500 MG tablet, TAKE 1 TABLET BY MOUTH FOUR TIMES DAILY, Disp: 120 tablet, Rfl: 5 .  metoprolol tartrate (LOPRESSOR) 50 MG tablet, TAKE 1 TABLET BY MOUTH TWICE  DAILY, Disp: 180 tablet, Rfl: 1 .  Multiple Minerals-Vitamins (CALCIUM & VIT D3 BONE HEALTH PO), Take by mouth. 600-400 mg, Disp: , Rfl:  .  nicotine polacrilex (COMMIT) 4 MG lozenge, Take 1 lozenge (4 mg total) by mouth as needed for smoking cessation., Disp: 27 lozenge, Rfl: 2 .  nitroGLYCERIN (NITROSTAT) 0.4 MG SL tablet, Place under the tongue., Disp: , Rfl:  .  omeprazole (PRILOSEC) 20 MG capsule, Take 20 mg by mouth 2 (two) times daily., Disp: , Rfl:  .  potassium chloride SA (KLOR-CON) 20 MEQ tablet, TAKE 1 TABLET BY MOUTH EVERY DAY, Disp: 90 tablet, Rfl: 3 .  promethazine-dextromethorphan (PROMETHAZINE-DM) 6.25-15 MG/5ML syrup, Take 5 mLs by mouth 4 (four) times daily as needed for cough., Disp: 118 mL, Rfl: 0 .  tamsulosin (FLOMAX) 0.4 MG CAPS capsule, Take 1 capsule (0.4 mg total) by mouth daily., Disp: 90 capsule, Rfl: 1 .   traZODone (DESYREL) 50 MG tablet, TAKE 1 TABLET(50 MG) BY MOUTH AT BEDTIME, Disp: 30 tablet, Rfl: 0 .  pantoprazole (PROTONIX) 40 MG tablet, Take 1 tablet (40 mg total) by mouth 2 (two) times daily before a meal., Disp: 180 tablet, Rfl: 1   Family History  Problem Relation Age of Onset  . Breast cancer Mother 23  . Diabetes Mother   . CAD Father   . Heart attack Father   . Diabetes Daughter   . Kidney failure Daughter   . Breast cancer Sister 29  . Sickle cell anemia Other   . Cancer Brother        lung  . Heart attack Sister   . Cancer Sister        ovarian  . Kidney cancer Neg Hx   . Bladder Cancer Neg Hx      Social History   Tobacco Use  . Smoking status: Current Some Day Smoker    Packs/day: 0.25    Years: 30.00    Pack years: 7.50    Types: Cigarettes  . Smokeless tobacco: Never Used  Vaping Use  . Vaping Use: Every day  Substance Use Topics  . Alcohol use: Yes    Alcohol/week: 1.0 standard drink    Types: 1 Glasses of wine per week  . Drug use: No    Allergies as of 03/05/2021 - Review Complete 03/05/2021  Allergen Reaction Noted  . Azithromycin Swelling 11/10/2016  . Propoxyphene Anaphylaxis 05/13/2015  . Tramadol Anaphylaxis 05/13/2015  . Penicillins Swelling 05/13/2015  . Piroxicam Rash 05/13/2015  . Sertraline hcl Rash 05/13/2015    Review of Systems:    All systems reviewed and negative except where noted in HPI.   Physical Exam:  BP 131/85 (BP Location: Left Arm, Patient Position: Sitting, Cuff Size: Normal)   Pulse 96   Temp 98.1 F (36.7 C) (Oral)   Ht _0  (1.549 m)   Wt 138 lb 8 oz (62.8 kg)   LMP  (LMP Unknown)   BMI 26.17 kg/m  No LMP recorded (lmp unknown). Patient has had a hysterectomy.  General:   Alert,  Well-developed, well-nourished, pleasant and cooperative in NAD Head:  Normocephalic and atraumatic. Eyes:  Sclera clear, no icterus.   Conjunctiva pink. Ears:  Normal auditory acuity. Nose:  No deformity, discharge, or  lesions. Mouth:  No deformity or lesions,oropharynx pink & moist. Neck:  Supple; no masses or thyromegaly. Lungs:  Respirations even and unlabored.  Clear throughout to auscultation.   No wheezes, crackles, or rhonchi. No acute distress. Heart:  Regular rate and rhythm; no murmurs, clicks, rubs, or gallops. Abdomen:  Normal bowel sounds. Soft, non-tender and non-distended without masses, hepatosplenomegaly or hernias noted.  No guarding or rebound tenderness.   Rectal: Not performed Msk:  Symmetrical without gross deformities. Good, equal movement & strength bilaterally. Pulses:  Normal pulses noted. Extremities:  No clubbing or edema.  No cyanosis. Neurologic:  Alert and oriented x3;  grossly normal neurologically. Skin:  Intact without significant lesions or rashes. No jaundice. Psych:  Alert and cooperative. Normal mood and affect.  Imaging Studies: Reviewed  Assessment and Plan:   Betty Vaughn is a 70 y.o. female with history of diabetes, hypertension, hyperlipidemia, history of chronic NSAID use, chronic tobacco use, developed gastric ulcer secondary to NSAIDs use  History of gastric ulcer: No evidence of H. pylori Repeat EGD confirmed healing of the ulcer Avoid NSAID use regularly  Abdominal pain, bloating and chronic constipation Continue Linzess 145 MCG every other day until finished.  She can then try low-dose Linzess  Pancreatic lesion based on the CT scan in 07/2020 No ductal dilatation or inflammation. Tiny hypodensity in the proximal pancreatic body, series 2, image 25 measures approximately 6 x 4 mm.  This lesion has been stable from 2014 exam Recommend follow up pre and post contrast MRI/MRCP or pancreatic protocol CT in 2 years. This recommendation follows ACR consensus guidelines: Management of Incidental Pancreatic Cysts: A White Paper of the ACR Incidental Findings Committee. Cataract [id number].  Follow up as needed  Cephas Darby, MD

## 2021-03-09 DIAGNOSIS — M5416 Radiculopathy, lumbar region: Secondary | ICD-10-CM | POA: Diagnosis not present

## 2021-03-16 DIAGNOSIS — H524 Presbyopia: Secondary | ICD-10-CM | POA: Diagnosis not present

## 2021-03-22 ENCOUNTER — Other Ambulatory Visit: Payer: Self-pay | Admitting: Internal Medicine

## 2021-03-22 DIAGNOSIS — G629 Polyneuropathy, unspecified: Secondary | ICD-10-CM

## 2021-03-22 NOTE — Telephone Encounter (Signed)
Requested Prescriptions  Pending Prescriptions Disp Refills  . DULoxetine (CYMBALTA) 60 MG capsule [Pharmacy Med Name: DULOXETINE DR 60MG  CAPSULES] 90 capsule 0    Sig: TAKE 1 CAPSULE(60 MG) BY MOUTH DAILY     Psychiatry: Antidepressants - SNRI Passed - 03/22/2021  6:30 AM      Passed - Completed PHQ-2 or PHQ-9 in the last 360 days      Passed - Last BP in normal range    BP Readings from Last 1 Encounters:  03/05/21 131/85         Passed - Valid encounter within last 6 months    Recent Outpatient Visits          1 month ago Tobacco use disorder   [facility] Glean Hess, MD   2 months ago Lumbar disc herniation with radiculopathy   [facility] Glean Hess, MD   5 months ago Annual physical exam   [facility] Glean Hess, MD   7 months ago Acute gastric ulcer with perforation [facility])   [facility] Glean Hess, MD   9 months ago Primary osteoarthritis of right knee   [facility] Glean Hess, MD      Future Appointments            In 1 week [name] [name], MD [facility], [place]   In 6 months [name], [name], MD [facility], [place]           . gabapentin (NEURONTIN) 300 MG capsule [Pharmacy Med Name: GABAPENTIN 300MG  CAPSULES] 360 capsule 0    Sig: TAKE 2 CAPSULES(600 MG) BY MOUTH TWICE DAILY     Neurology: Anticonvulsants - gabapentin Passed - 03/22/2021  6:30 AM      Passed - Valid encounter within last 12 months    Recent Outpatient Visits          1 month ago Tobacco use disorder   [facility] Glean Hess, MD   2 months ago Lumbar disc herniation with radiculopathy   [facility] Glean Hess, MD   5 months ago Annual physical exam   [facility] Glean Hess, MD   7 months ago Acute gastric ulcer with perforation [facility])   [facility] Glean Hess, MD   9 months ago Primary osteoarthritis of right knee    [facility] Glean Hess, MD      Future Appointments            In 1 week [name] [name], MD [facility], [place]   In 6 months [name], [name], MD [facility], [facility]

## 2021-03-27 ENCOUNTER — Other Ambulatory Visit: Payer: Self-pay | Admitting: Internal Medicine

## 2021-03-27 ENCOUNTER — Other Ambulatory Visit: Payer: Self-pay | Admitting: Gastroenterology

## 2021-03-27 DIAGNOSIS — R3911 Hesitancy of micturition: Secondary | ICD-10-CM

## 2021-03-27 NOTE — Telephone Encounter (Signed)
Notes to clinic: Medication requested was d/c Review for continued use and refill   Requested Prescriptions  Pending Prescriptions Disp Refills   Calcium Carbonate-Vitamin D3 600-400 MG-UNIT TABS [Pharmacy Med Name: CALCIUM 600+D (400U) TABLETS] 30 tablet 5    Sig: TAKE 1 TABLET BY MOUTH EVERY DAY      Endocrinology: Minerals - Calcium Supplementation with Vitamin D Failed - 03/27/2021  6:29 AM      Failed - Phosphate in normal range and within 360 days    No results found for: PHOS        Failed - Vitamin D in normal range and within 360 days    No results found for: [id number], [id number], [id number], 25OHVITD3, 25OHVITD2, 25OHVITD3, 25OHVITD2, 25OHVITD1, 25OHVITD2, 25OHVITD3, VD25OH        Passed - Ca in normal range and within 360 days    Calcium  Date Value Ref Range Status  10/13/2020 9.9 8.7 - 10.3 mg/dL Final   Calcium, Total  Date Value Ref Range Status  07/06/2013 9.1 8.5 - 10.1 mg/dL Final          Passed - Valid encounter within last 12 months    Recent Outpatient Visits           2 months ago Tobacco use disorder   [facility] Glean Hess, MD   2 months ago Lumbar disc herniation with radiculopathy   [facility] Glean Hess, MD   5 months ago Annual physical exam   [facility] Glean Hess, MD   7 months ago Acute gastric ulcer with perforation [facility])   [facility] Glean Hess, MD   9 months ago Primary osteoarthritis of right knee   [facility] Glean Hess, MD       Future Appointments             In 2 days Glean Hess, MD [facility] [place]   In 6 months Glean Hess, MD [facility]              Signed Prescriptions Disp Refills   tamsulosin (FLOMAX) 0.4 MG CAPS capsule 90 capsule 1    Sig: TAKE 1 CAPSULE(0.4 MG) BY MOUTH DAILY      Urology: Alpha-Adrenergic Blocker Passed - 03/27/2021  6:29 AM      Passed - Last BP in normal  range    BP Readings from Last 1 Encounters:  03/05/21 131/85          Passed - Valid encounter within last 12 months    Recent Outpatient Visits           2 months ago Tobacco use disorder   [facility] Glean Hess, MD   2 months ago Lumbar disc herniation with radiculopathy   [facility] Glean Hess, MD   5 months ago Annual physical exam   [facility] Glean Hess, MD   7 months ago Acute gastric ulcer with perforation [facility])   [facility] Glean Hess, MD   9 months ago Primary osteoarthritis of right knee   [facility] Glean Hess, MD       Future Appointments             In 2 days Glean Hess, MD [facility], [place]   In 6 months [name], [name], MD [facility], [facility]

## 2021-03-29 ENCOUNTER — Encounter: Payer: Self-pay | Admitting: Internal Medicine

## 2021-03-29 ENCOUNTER — Other Ambulatory Visit: Payer: Self-pay

## 2021-03-29 ENCOUNTER — Ambulatory Visit (INDEPENDENT_AMBULATORY_CARE_PROVIDER_SITE_OTHER): Payer: Medicare Other | Admitting: Internal Medicine

## 2021-03-29 ENCOUNTER — Other Ambulatory Visit: Payer: Self-pay | Admitting: Internal Medicine

## 2021-03-29 VITALS — BP 124/80 | HR 84 | Ht 61.0 in | Wt 139.0 lb

## 2021-03-29 DIAGNOSIS — F17200 Nicotine dependence, unspecified, uncomplicated: Secondary | ICD-10-CM

## 2021-03-29 DIAGNOSIS — F3341 Major depressive disorder, recurrent, in partial remission: Secondary | ICD-10-CM

## 2021-03-29 DIAGNOSIS — R7303 Prediabetes: Secondary | ICD-10-CM

## 2021-03-29 DIAGNOSIS — N289 Disorder of kidney and ureter, unspecified: Secondary | ICD-10-CM | POA: Diagnosis not present

## 2021-03-29 DIAGNOSIS — Z9109 Other allergy status, other than to drugs and biological substances: Secondary | ICD-10-CM

## 2021-03-29 DIAGNOSIS — I10 Essential (primary) hypertension: Secondary | ICD-10-CM | POA: Diagnosis not present

## 2021-03-29 DIAGNOSIS — F5104 Psychophysiologic insomnia: Secondary | ICD-10-CM

## 2021-03-29 MED ORDER — ALBUTEROL SULFATE HFA 108 (90 BASE) MCG/ACT IN AERS: 2.0000 | INHALATION_SPRAY | Freq: Four times a day (QID) | RESPIRATORY_TRACT | 1 refills | Status: DC | PRN

## 2021-03-29 MED ORDER — MONTELUKAST SODIUM 10 MG PO TABS: 10.0000 mg | ORAL_TABLET | Freq: Every day | ORAL | 1 refills | Status: DC

## 2021-03-29 NOTE — Progress Notes (Signed)
Date:  03/29/2021   Name:  Betty Vaughn   DOB:  [birth year]   MRN:  [id number]   Chief Complaint: Hypertension and Diabetes  Diabetes She presents for her follow-up diabetic visit. Diabetes type: prediabetes. Her disease course has been stable. Pertinent negatives for hypoglycemia include no dizziness or headaches. Pertinent negatives for diabetes include no chest pain, no fatigue and no weakness.  Hypertension This is a chronic problem. The problem is controlled. Associated symptoms include shortness of breath. Pertinent negatives include no chest pain, headaches or palpitations. Past treatments include beta blockers. The current treatment provides significant improvement.  Cough This is a recurrent problem. The current episode started more than 1 month ago. The problem has been unchanged. The problem occurs every few minutes. The cough is non-productive. Associated symptoms include shortness of breath. Pertinent negatives include no chest pain, headaches, nasal congestion, postnasal drip or wheezing. The symptoms are aggravated by animals (her daughters new dog). Risk factors for lung disease include animal exposure. She has tried a beta-agonist inhaler for the symptoms. The treatment provided moderate (but not persistent) relief. Her past medical history is significant for asthma.    Lab Results  Component Value Date   CREATININE 0.99 10/13/2020   BUN 13 10/13/2020   NA 139 10/13/2020   K 3.7 10/13/2020   CL 98 10/13/2020   CO2 25 10/13/2020   Lab Results  Component Value Date   CHOL 154 10/13/2020   HDL 40 10/13/2020   LDLCALC 88 10/13/2020   TRIG 146 10/13/2020   CHOLHDL 3.9 10/13/2020   Lab Results  Component Value Date   TSH 2.680 10/13/2020   Lab Results  Component Value Date   HGBA1C 6.2 (H) 10/13/2020   Lab Results  Component Value Date   WBC 7.4 10/13/2020   HGB 13.8 10/13/2020   HCT 40.6 10/13/2020   MCV 91 10/13/2020   PLT 275 10/13/2020   Lab  Results  Component Value Date   ALT 17 10/13/2020   AST 19 10/13/2020   ALKPHOS 111 10/13/2020   BILITOT 0.2 10/13/2020     Review of Systems  Constitutional: Negative for fatigue and unexpected weight change.  HENT: Negative for nosebleeds and postnasal drip.   Eyes: Negative for visual disturbance.  Respiratory: Positive for cough, chest tightness and shortness of breath. Negative for wheezing.   Cardiovascular: Negative for chest pain, palpitations and leg swelling.  Gastrointestinal: Negative for abdominal pain, constipation and diarrhea.  Musculoskeletal: Positive for arthralgias and back pain.  Neurological: Negative for dizziness, weakness, light-headedness and headaches.    Patient Active Problem List   Diagnosis Date Noted  . PUD (peptic ulcer disease)   . Myofascial muscle pain 01/22/2019  . Weakness of both arms 09/08/2018  . Renal insufficiency 09/08/2018  . 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 Cervical stenosis of spinal canal 01/14/2018  . Personal history of colonic polyps   . Rectal polyp   . Tobacco use disorder 11/20/2016  . Neuropathy 04/10/2016  . Prediabetes 05/13/2015  . Benign essential tremor 05/13/2015  . Chronic insomnia 05/13/2015  . 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 Multiple joint complaints 05/13/2015  . Depression, major, recurrent, in partial remission ([place]) 02/23/2014  . Essential hypertension 02/23/2014  . Mixed hyperlipidemia 02/23/2014  . Chronic headache disorder 01/16/2012  . 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 CAD S/P percutaneous coronary angioplasty 03/16/2010    Allergies  Allergen Reactions  . Azithromycin Swelling  . Propoxyphene Anaphylaxis  .  Tramadol Anaphylaxis  . Penicillins Swelling  . Piroxicam Rash  . Sertraline Hcl Rash    Past Surgical History:  Procedure Laterality Date  . ABDOMINAL HYSTERECTOMY     total  . CERVICAL FUSION  2005  . CERVICAL FUSION  01/2018   C2-T2 posterior laminectomy  and fusion; removal of previous hardware  . CHOLECYSTECTOMY    . COLONOSCOPY WITH PROPOFOL N/A 06/12/2017   Procedure: COLONOSCOPY WITH PROPOFOL;  Surgeon: Lucilla Lame, MD;  Location: [place];  Service: Gastroenterology;  Laterality: N/A;  . 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"  . ESOPHAGOGASTRODUODENOSCOPY (EGD) WITH PROPOFOL N/A 08/22/2020   Procedure: ESOPHAGOGASTRODUODENOSCOPY (EGD) WITH PROPOFOL;  Surgeon: Lin Landsman, MD;  Location: [place];  Service: Gastroenterology;  Laterality: N/A;  . ESOPHAGOGASTRODUODENOSCOPY (EGD) WITH PROPOFOL N/A 11/01/2020   Procedure: ESOPHAGOGASTRODUODENOSCOPY (EGD) WITH PROPOFOL;  Surgeon: Lin Landsman, MD;  Location[facility] ENDOSCOPY;  Service: Gastroenterology;  Laterality: N/A;  . HAND SURGERY Bilateral 2001  . POLYPECTOMY  2000  . POLYPECTOMY N/A 06/12/2017   Procedure: POLYPECTOMY;  Surgeon: Lucilla Lame, MD;  Location: [place];  Service: Gastroenterology;  Laterality: N/A;  . rigger finger      Social History   Tobacco Use  . Smoking status: Current Some Day Smoker    Packs/day: 0.25    Years: 30.00    Pack years: 7.50    Types: Cigarettes  . Smokeless tobacco: Never Used  Vaping Use  . Vaping Use: Every day  Substance Use Topics  . Alcohol use: Yes    Alcohol/week: 1.0 standard drink    Types: 1 Glasses of wine per week  . Drug use: No     Medication list has been reviewed and updated.  Current Meds  Medication Sig  . Accu-Chek FastClix Lancets MISC USE TO TEST BLOOD SUGAR TWICE DAILY  . ACCU-CHEK GUIDE test strip USE TO TEST BLOOD SUGAR TWICE DAILY  . albuterol (VENTOLIN HFA) 108 (90 Base) MCG/ACT inhaler Inhale 2 puffs into the lungs every 6 (six) hours as needed for wheezing or shortness of breath.  Marland Kitchen amLODipine (NORVASC) 5 MG tablet TAKE 1 TABLET BY MOUTH AT BEDTIME  . atorvastatin (LIPITOR) 40 MG tablet TAKE 1 TABLET BY MOUTH EVERY DAY  . Blood Glucose Monitoring Suppl  (ACCU-CHEK GUIDE ME) w/Device KIT TO TEST TWICE DAILY  . Calcium Carbonate-Vitamin D3 600-400 MG-UNIT TABS Take 1 tablet by mouth daily.  . chlorthalidone (HYGROTON) 25 MG tablet TAKE 1 TABLET BY MOUTH EVERY DAY  . DULoxetine (CYMBALTA) 60 MG capsule TAKE 1 CAPSULE(60 MG) BY MOUTH DAILY  . gabapentin (NEURONTIN) 300 MG capsule TAKE 2 CAPSULES(600 MG) BY MOUTH TWICE DAILY  . LINZESS 145 MCG CAPS capsule TAKE 1 CAPSULE(145 MCG) BY MOUTH DAILY BEFORE AND BREAKFAST (Patient taking differently: Take 145 mcg by mouth every other day.)  . loratadine (CLARITIN) 10 MG tablet Take 1 tablet (10 mg total) by mouth daily.  . methocarbamol (ROBAXIN) 500 MG tablet TAKE 1 TABLET BY MOUTH FOUR TIMES DAILY  . metoprolol tartrate (LOPRESSOR) 50 MG tablet TAKE 1 TABLET BY MOUTH TWICE DAILY  . Multiple Minerals-Vitamins (CALCIUM & VIT D3 BONE HEALTH PO) Take by mouth. 600-400 mg  . nicotine polacrilex (COMMIT) 4 MG lozenge Take 1 lozenge (4 mg total) by mouth as needed for smoking cessation.  . nitroGLYCERIN (NITROSTAT) 0.4 MG SL tablet Place under the tongue.  Marland Kitchen omeprazole (PRILOSEC) 20 MG capsule Take 20 mg by mouth 2 (two) times  daily.  . pantoprazole (PROTONIX) 40 MG tablet TAKE 1 TABLET(40 MG) BY MOUTH TWICE DAILY BEFORE A MEAL  . potassium chloride SA (KLOR-CON) 20 MEQ tablet TAKE 1 TABLET BY MOUTH EVERY DAY  . tamsulosin (FLOMAX) 0.4 MG CAPS capsule TAKE 1 CAPSULE(0.4 MG) BY MOUTH DAILY  . traZODone (DESYREL) 50 MG tablet TAKE 1 TABLET(50 MG) BY MOUTH AT BEDTIME  . [DISCONTINUED] promethazine-dextromethorphan (PROMETHAZINE-DM) 6.25-15 MG/5ML syrup Take 5 mLs by mouth 4 (four) times daily as needed for cough.    PHQ 2/9 Scores 03/29/2021 01/25/2021 01/16/2021 10/13/2020  PHQ - 2 Score 2 0 4 0  PHQ- 9 Score 18 3 9  0    GAD 7 : Generalized Anxiety Score 01/25/2021 01/16/2021 10/13/2020 06/07/2020  Nervous, Anxious, on Edge 1 2 0 2  Control/stop worrying 1 2 0 3  Worry too much - different things 1 2 0 3  Trouble  relaxing 0 1 0 1  Restless 0 1 0 0  Easily annoyed or irritable 0 1 0 3  Afraid - awful might happen 0 0 0 0  Total GAD 7 Score 3 9 0 12  Anxiety Difficulty - Not difficult at all Not difficult at all Not difficult at all    BP Readings from Last 3 Encounters:  03/29/21 124/80  03/05/21 131/85  01/25/21 138/76    Physical Exam Vitals and nursing note reviewed.  Constitutional:      General: She is not in acute distress.    Appearance: She is well-developed.  HENT:     Head: Normocephalic and atraumatic.  Cardiovascular:     Rate and Rhythm: Normal rate and regular rhythm.  Pulmonary:     Effort: Pulmonary effort is normal. No respiratory distress.     Breath sounds: Normal breath sounds. No wheezing or rhonchi.  Musculoskeletal:     Cervical back: Rigidity and tenderness present.     Right lower leg: No edema.     Left lower leg: No edema.  Skin:    General: Skin is warm and dry.     Findings: No rash.  Neurological:     Mental Status: She is alert and oriented to person, place, and time.  Psychiatric:        Mood and Affect: Mood normal.        Behavior: Behavior normal.     Wt Readings from Last 3 Encounters:  03/29/21 139 lb (63 kg)  03/05/21 138 lb 8 oz (62.8 kg)  01/25/21 143 lb (64.9 kg)    BP 124/80   Pulse 84   Ht 5' 1"  (1.549 m)   Wt 139 lb (63 kg)   LMP  (LMP Unknown)   SpO2 98%   BMI 26.26 kg/m   Assessment and Plan: 1. Essential hypertension Clinically stable exam with well controlled BP. Tolerating medications without side effects at this time. Pt to continue current regimen and low sodium diet; benefits of regular exercise as able discussed.  2. Prediabetes Continue to limit carbs/sweets - Hemoglobin A1c  3. Renal insufficiency Mild currently; advised to avoid regular doses of NDAIDS - Basic metabolic panel  4. Tobacco use disorder Cutting back successfully with nicotine lozenges Continue to use Ventolin PRN -hopefully can decrease  use with treatment of allergies - albuterol (VENTOLIN HFA) 108 (90 Base) MCG/ACT inhaler; Inhale 2 puffs into the lungs every 6 (six) hours as needed for wheezing or shortness of breath.  Dispense: 18 g; Refill: 1  5. Environmental allergies Continue Claritin; add  singulair - montelukast (SINGULAIR) 10 MG tablet; Take 1 tablet (10 mg total) by mouth at bedtime.  Dispense: 90 tablet; Refill: 1  6. Depression, major, recurrent, in partial remission ([place]) Tolerating medications well; multiple factors contribute to symptoms Continue Cymbalta 60 mg - consider increase to 90 mg. Trazodone for sleep.   Partially dictated using Editor, commissioning. Any errors are unintentional.  Halina Maidens, MD [facility]  03/29/2021

## 2021-03-30 LAB — BASIC METABOLIC PANEL
BUN/Creatinine Ratio: 12 (ref 12–28)
BUN: 11 mg/dL (ref 8–27)
CO2: 25 mmol/L (ref 20–29)
Calcium: 10 mg/dL (ref 8.7–10.3)
Chloride: 98 mmol/L (ref 96–106)
Creatinine, Ser: 0.9 mg/dL (ref 0.57–1.00)
Glucose: 113 mg/dL — ABNORMAL HIGH (ref 65–99)
Potassium: 3.9 mmol/L (ref 3.5–5.2)
Sodium: 138 mmol/L (ref 134–144)
eGFR: 69 mL/min/{1.73_m2} (ref >59)

## 2021-03-30 LAB — HEMOGLOBIN A1C
Est. average glucose Bld gHb Est-mCnc: 140 mg/dL
Hgb A1c MFr Bld: 6.5 % — ABNORMAL HIGH (ref 4.8–5.6)

## 2021-04-21 ENCOUNTER — Other Ambulatory Visit: Payer: Self-pay | Admitting: Internal Medicine

## 2021-05-08 ENCOUNTER — Other Ambulatory Visit: Payer: Self-pay | Admitting: Internal Medicine

## 2021-05-08 DIAGNOSIS — F5104 Psychophysiologic insomnia: Secondary | ICD-10-CM

## 2021-05-09 ENCOUNTER — Emergency Department
Admission: EM | Admit: 2021-05-09 | Discharge: 2021-05-10 | Disposition: A | Discharge status: Home / Self Care | Payer: Medicare Other | Attending: Emergency Medicine | Admitting: Emergency Medicine

## 2021-05-09 ENCOUNTER — Emergency Department: Payer: Medicare Other

## 2021-05-09 ENCOUNTER — Encounter: Payer: Self-pay | Admitting: Emergency Medicine

## 2021-05-09 ENCOUNTER — Other Ambulatory Visit: Payer: Self-pay

## 2021-05-09 DIAGNOSIS — I2510 Atherosclerotic heart disease of native coronary artery without angina pectoris: Secondary | ICD-10-CM | POA: Insufficient documentation

## 2021-05-09 DIAGNOSIS — G4489 Other headache syndrome: Secondary | ICD-10-CM | POA: Diagnosis not present

## 2021-05-09 DIAGNOSIS — Z85038 Personal history of other malignant neoplasm of large intestine: Secondary | ICD-10-CM | POA: Insufficient documentation

## 2021-05-09 DIAGNOSIS — Z79899 Other long term (current) drug therapy: Secondary | ICD-10-CM | POA: Diagnosis not present

## 2021-05-09 DIAGNOSIS — R29898 Other symptoms and signs involving the musculoskeletal system: Secondary | ICD-10-CM | POA: Diagnosis not present

## 2021-05-09 DIAGNOSIS — I10 Essential (primary) hypertension: Secondary | ICD-10-CM | POA: Diagnosis not present

## 2021-05-09 DIAGNOSIS — R531 Weakness: Secondary | ICD-10-CM | POA: Diagnosis not present

## 2021-05-09 DIAGNOSIS — E119 Type 2 diabetes mellitus without complications: Secondary | ICD-10-CM | POA: Insufficient documentation

## 2021-05-09 DIAGNOSIS — F17210 Nicotine dependence, cigarettes, uncomplicated: Secondary | ICD-10-CM | POA: Insufficient documentation

## 2021-05-09 DIAGNOSIS — R519 Headache, unspecified: Secondary | ICD-10-CM | POA: Diagnosis not present

## 2021-05-09 DIAGNOSIS — I639 Cerebral infarction, unspecified: Secondary | ICD-10-CM | POA: Diagnosis not present

## 2021-05-09 DIAGNOSIS — R55 Syncope and collapse: Secondary | ICD-10-CM | POA: Insufficient documentation

## 2021-05-09 DIAGNOSIS — E876 Hypokalemia: Secondary | ICD-10-CM | POA: Insufficient documentation

## 2021-05-09 DIAGNOSIS — Z743 Need for continuous supervision: Secondary | ICD-10-CM | POA: Diagnosis not present

## 2021-05-09 DIAGNOSIS — R404 Transient alteration of awareness: Secondary | ICD-10-CM | POA: Diagnosis not present

## 2021-05-09 DIAGNOSIS — R6889 Other general symptoms and signs: Secondary | ICD-10-CM | POA: Diagnosis not present

## 2021-05-09 LAB — CBC WITH DIFFERENTIAL/PLATELET
Abs Immature Granulocytes: 0.02 10*3/uL (ref 0.00–0.07)
Basophils Absolute: 0 10*3/uL (ref 0.0–0.1)
Basophils Relative: 0 %
Eosinophils Absolute: 0.1 10*3/uL (ref 0.0–0.5)
Eosinophils Relative: 2 %
HCT: 32.5 % — ABNORMAL LOW (ref 36.0–46.0)
Hemoglobin: 10.7 g/dL — ABNORMAL LOW (ref 12.0–15.0)
Immature Granulocytes: 0 %
Lymphocytes Relative: 41 %
Lymphs Abs: 2.8 10*3/uL (ref 0.7–4.0)
MCH: 31.8 pg (ref 26.0–34.0)
MCHC: 32.9 g/dL (ref 30.0–36.0)
MCV: 96.4 fL (ref 80.0–100.0)
Monocytes Absolute: 0.5 10*3/uL (ref 0.1–1.0)
Monocytes Relative: 7 %
Neutro Abs: 3.4 10*3/uL (ref 1.7–7.7)
Neutrophils Relative %: 50 %
Platelets: 166 10*3/uL (ref 150–400)
RBC: 3.37 MIL/uL — ABNORMAL LOW (ref 3.87–5.11)
RDW: 13.7 % (ref 11.5–15.5)
WBC: 6.9 10*3/uL (ref 4.0–10.5)
nRBC: 0 % (ref 0.0–0.2)

## 2021-05-09 LAB — COMPREHENSIVE METABOLIC PANEL
ALT: 16 U/L (ref 0–44)
AST: 23 U/L (ref 15–41)
Albumin: 3.1 g/dL — ABNORMAL LOW (ref 3.5–5.0)
Alkaline Phosphatase: 58 U/L (ref 38–126)
Anion gap: 5 (ref 5–15)
BUN: 11 mg/dL (ref 8–23)
CO2: 26 mmol/L (ref 22–32)
Calcium: 8.3 mg/dL — ABNORMAL LOW (ref 8.9–10.3)
Chloride: 107 mmol/L (ref 98–111)
Creatinine, Ser: 0.95 mg/dL (ref 0.44–1.00)
GFR, Estimated: >60 mL/min (ref >60)
Glucose, Bld: 98 mg/dL (ref 70–99)
Potassium: 2.7 mmol/L — CL (ref 3.5–5.1)
Sodium: 138 mmol/L (ref 135–145)
Total Bilirubin: 0.6 mg/dL (ref 0.3–1.2)
Total Protein: 6 g/dL — ABNORMAL LOW (ref 6.5–8.1)

## 2021-05-09 LAB — CBG MONITORING, ED: Glucose-Capillary: 84 mg/dL (ref 70–99)

## 2021-05-09 LAB — PROTIME-INR
INR: 1.8 — ABNORMAL HIGH (ref 0.8–1.2)
Prothrombin Time: 21.2 seconds — ABNORMAL HIGH (ref 11.4–15.2)

## 2021-05-09 LAB — TROPONIN I (HIGH SENSITIVITY): Troponin I (High Sensitivity): 2 ng/L (ref <18)

## 2021-05-09 LAB — APTT: aPTT: 32 seconds (ref 24–36)

## 2021-05-09 LAB — ETHANOL: Alcohol, Ethyl (B): <10 mg/dL (ref <10)

## 2021-05-09 MED ORDER — LACTATED RINGERS IV BOLUS
1000.0000 mL | Freq: Once | INTRAVENOUS | Status: AC
  Administered 2021-05-09: 1000 mL via INTRAVENOUS

## 2021-05-09 MED ORDER — POTASSIUM CHLORIDE 10 MEQ/100ML IV SOLN
10.0000 meq | INTRAVENOUS | Status: AC
  Administered 2021-05-09 – 2021-05-10 (×2): 10 meq via INTRAVENOUS
  Filled 2021-05-09 (×2): qty 100

## 2021-05-09 MED ORDER — POTASSIUM CHLORIDE CRYS ER 20 MEQ PO TBCR
40.0000 meq | EXTENDED_RELEASE_TABLET | Freq: Once | ORAL | Status: AC
  Administered 2021-05-09: 40 meq via ORAL
  Filled 2021-05-09: qty 2

## 2021-05-09 NOTE — ED Provider Notes (Addendum)
[facility] Emergency Department Provider Note ____________________________________________   Event Date/Time   First MD Initiated Contact with Patient 05/09/21 2206     (approximate)  I have reviewed the triage vital signs and the nursing notes.  HISTORY  Chief Complaint Loss of Consciousness   HPI Betty Vaughn is a 70 y.o. femalewho presents to the ED for evaluation of syncope.   Chart review indicates hx HTN, HLD, DM.   Patient presents to the ED for evaluation of syncope and headache.  History obtained from EMS, family and patient.  Patient was on the back porch seated with family outside today on a hot day.  Family reports she was holding her head, appeared uncomfortable, and said that she was going to go home earlier than anticipated.  She got up from a chair, stumbled back into the chair, got up again and syncopized.  Last known normal was 8:30 PM.  EMS reports she was hypotensive and diaphoretic upon arrival, BP 73/40.  They provided IV fluids with improvement of her blood pressure.   Here in the ED, patient reports right-sided headache.  She reports that she does not know what happened.  Past Medical History:  Diagnosis Date   Allergy    Arthritis    Chronic insomnia    Colon cancer ([place])    polyp was cancerous   Depression    Diabetes mellitus without complication (HCC)    GERD (gastroesophageal reflux disease)    Hx of colonic polyps    Hyperlipidemia    Hypertension    Neuromuscular disorder ([place])    Perforated gastric ulcer ([place]) 08/01/2020   Psoriasis    scalp    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   Cervical stenosis of spinal canal 01/14/2018   Personal history of colonic polyps     Rectal polyp    Tobacco use disorder 11/20/2016   Neuropathy 04/10/2016   Prediabetes 05/13/2015   Benign essential tremor 05/13/2015   Chronic insomnia 05/13/2015   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   Multiple joint complaints 05/13/2015   Depression, major, recurrent, 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/23/2014   Essential hypertension 02/23/2014   Mixed hyperlipidemia 02/23/2014   Chronic headache disorder 01/16/2012   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   CAD S/P percutaneous coronary angioplasty 03/16/2010    Past Surgical History:  Procedure Laterality Date   ABDOMINAL HYSTERECTOMY     total   CERVICAL FUSION  2005   CERVICAL FUSION  01/2018   C2-T2 posterior laminectomy and fusion; removal of previous hardware   CHOLECYSTECTOMY     COLONOSCOPY WITH PROPOFOL N/A 06/12/2017   Procedure: COLONOSCOPY WITH PROPOFOL;  Surgeon: Lucilla Lame, MD;  Location: [place];  Service: Gastroenterology;  Laterality: N/A;   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"   ESOPHAGOGASTRODUODENOSCOPY (EGD) WITH PROPOFOL N/A 08/22/2020   Procedure: ESOPHAGOGASTRODUODENOSCOPY (EGD) WITH PROPOFOL;  Surgeon: Lin Landsman, MD;  Location: [place];  Service: Gastroenterology;  Laterality: N/A;   ESOPHAGOGASTRODUODENOSCOPY (EGD) WITH PROPOFOL N/A 11/01/2020   Procedure: ESOPHAGOGASTRODUODENOSCOPY (EGD) WITH PROPOFOL;  Surgeon: Lin Landsman, MD;  Location: [facility] ENDOSCOPY;  Service: Gastroenterology;  Laterality: N/A;   HAND SURGERY Bilateral 2001   POLYPECTOMY  2000   POLYPECTOMY N/A 06/12/2017  Procedure: POLYPECTOMY;  Surgeon: Lucilla Lame, MD;  Location: [place];  Service: Gastroenterology;  Laterality: N/A;   rigger finger      Prior to Admission medications   Medication Sig Start Date End Date Taking? Authorizing Provider  Accu-Chek FastClix Lancets MISC USE TO TEST BLOOD SUGAR TWICE DAILY 06/19/19   Glean Hess, MD  ACCU-CHEK GUIDE test strip USE TO TEST  BLOOD SUGAR TWICE DAILY 06/19/19   Glean Hess, MD  albuterol (VENTOLIN HFA) 108 (90 Base) MCG/ACT inhaler Inhale 2 puffs into the lungs every 6 (six) hours as needed for wheezing or shortness of breath. 03/29/21   Glean Hess, MD  amLODipine (NORVASC) 5 MG tablet TAKE 1 TABLET BY MOUTH AT BEDTIME 04/21/21   Glean Hess, MD  atorvastatin (LIPITOR) 40 MG tablet TAKE 1 TABLET BY MOUTH EVERY DAY 04/21/21   Glean Hess, MD  Blood Glucose Monitoring Suppl (ACCU-CHEK GUIDE ME) w/Device KIT TO TEST TWICE DAILY 06/19/19   Glean Hess, MD  Calcium Carbonate-Vitamin D3 600-400 MG-UNIT TABS Take 1 tablet by mouth daily. 03/27/21   Glean Hess, MD  chlorthalidone (HYGROTON) 25 MG tablet TAKE 1 TABLET BY MOUTH EVERY DAY 04/21/21   Glean Hess, MD  DULoxetine (CYMBALTA) 60 MG capsule TAKE 1 CAPSULE(60 MG) BY MOUTH DAILY 03/22/21   Glean Hess, MD  gabapentin (NEURONTIN) 300 MG capsule TAKE 2 CAPSULES(600 MG) BY MOUTH TWICE DAILY 03/22/21   Glean Hess, MD  LINZESS 145 MCG CAPS capsule TAKE 1 CAPSULE(145 MCG) BY MOUTH DAILY BEFORE AND BREAKFAST Patient taking differently: Take 145 mcg by mouth every other day. 03/27/21   Lin Landsman, MD  loratadine (CLARITIN) 10 MG tablet Take 1 tablet (10 mg total) by mouth daily. 01/25/21   Glean Hess, MD  methocarbamol (ROBAXIN) 500 MG tablet TAKE 1 TABLET BY MOUTH FOUR TIMES DAILY 11/22/20   Glean Hess, MD  metoprolol tartrate (LOPRESSOR) 50 MG tablet TAKE 1 TABLET BY MOUTH TWICE DAILY 04/21/21   Glean Hess, MD  montelukast (SINGULAIR) 10 MG tablet Take 1 tablet (10 mg total) by mouth at bedtime. 03/29/21   Glean Hess, MD  Multiple Minerals-Vitamins (CALCIUM & VIT D3 BONE HEALTH PO) Take by mouth. 600-400 mg    [provider]  nicotine polacrilex (COMMIT) 4 MG lozenge Take 1 lozenge (4 mg total) by mouth as needed for smoking cessation. 01/25/21   Glean Hess, MD  nitroGLYCERIN (NITROSTAT)  0.4 MG SL tablet Place under the tongue.    [provider]  omeprazole (PRILOSEC) 20 MG capsule Take 20 mg by mouth 2 (two) times daily. 11/23/20   [provider]  pantoprazole (PROTONIX) 40 MG tablet TAKE 1 TABLET(40 MG) BY MOUTH TWICE DAILY BEFORE A MEAL 03/27/21   Vanga, Tally Due, MD  potassium chloride SA (KLOR-CON) 20 MEQ tablet TAKE 1 TABLET BY MOUTH EVERY DAY 04/21/21   Glean Hess, MD  tamsulosin (FLOMAX) 0.4 MG CAPS capsule TAKE 1 CAPSULE(0.4 MG) BY MOUTH DAILY 03/27/21   Glean Hess, MD  traZODone (DESYREL) 50 MG tablet TAKE 1 TABLET(50 MG) BY MOUTH AT BEDTIME 05/08/21   Glean Hess, MD    Allergies Azithromycin, Propoxyphene, Tramadol, Penicillins, Piroxicam, and Sertraline hcl  Family History  Problem Relation Age of Onset   Breast cancer Mother 78   Diabetes Mother    CAD Father    Heart attack Father    Diabetes Daughter  Kidney failure Daughter    Breast cancer Sister 1   Sickle cell anemia Other    Cancer Brother        lung   Heart attack Sister    Cancer Sister        ovarian   Kidney cancer Neg Hx    Bladder Cancer Neg Hx     Social History Social History   Tobacco Use   Smoking status: Some Days    Packs/day: 0.25    Years: 30.00    Pack years: 7.50    Types: Cigarettes   Smokeless tobacco: Never  Vaping Use   Vaping Use: Every day  Substance Use Topics   Alcohol use: Yes    Alcohol/week: 1.0 standard drink    Types: 1 Glasses of wine per week   Drug use: No    Review of Systems  Constitutional: No fever/chills Eyes: No visual changes. ENT: No sore throat. Cardiovascular: Denies chest pain. Respiratory: Denies shortness of breath. Gastrointestinal: No abdominal pain.  No nausea, no vomiting.  No diarrhea.  No constipation. Genitourinary: Negative for dysuria. Musculoskeletal: Negative for back pain. Skin: Negative for rash. Neurological: Negative for  focal weakness or numbness.  Positive for  syncope and headache  ____________________________________________   PHYSICAL EXAM:  VITAL SIGNS: Vitals:   05/09/21 2215 05/09/21 2230  BP:  (!) 131/97  Pulse:  65  Resp:  17  Temp: 97.9 F (36.6 C)   SpO2:  100%     Constitutional: Alert and oriented. In no acute distress.  Speaks in a soft voice I frequently have to ask her to repeat herself or speak up. Eyes: Conjunctivae are normal. PERRL. EOMI. Head: Atraumatic. Nose: No congestion/rhinnorhea. Mouth/Throat: Mucous membranes are moist.  Oropharynx non-erythematous. Neck: No stridor. No cervical spine tenderness to palpation. Cardiovascular: Normal rate, regular rhythm. Grossly normal heart sounds.  Good peripheral circulation. Respiratory: Normal respiratory effort.  No retractions. Lungs CTAB. Gastrointestinal: Soft , nondistended, nontender to palpation. No CVA tenderness. Musculoskeletal: No lower extremity edema.  No joint effusions. No signs of acute trauma. Neurologic: 4/5 strength to the right leg, 5/5 in the left. Sensation intact throughout. Lateral drift of the left arm bilateral arm elevation. Possible mild facial asymmetry.  She has a hard time actually smiling and moving her facial muscles.  Skin:  Skin is warm, dry and intact. No rash noted. Psychiatric: Mood and affect are normal. Speech and behavior are normal.  ____________________________________________   LABS (all labs ordered are listed, but only abnormal results are displayed)  Labs Reviewed  COMPREHENSIVE METABOLIC PANEL - Abnormal; Notable for the following components:      Result Value   Potassium 2.7 (*)    Calcium 8.3 (*)    Total Protein 6.0 (*)    Albumin 3.1 (*)    All other components within normal limits  CBC WITH DIFFERENTIAL/PLATELET - Abnormal; Notable for the following components:   RBC 3.37 (*)    Hemoglobin 10.7 (*)    HCT 32.5 (*)    All other components within normal limits  PROTIME-INR - Abnormal; Notable for the  following components:   Prothrombin Time 21.2 (*)    INR 1.8 (*)    All other components within normal limits  RESP PANEL BY RT-PCR (FLU A&B, COVID) ARPGX2  ETHANOL  APTT  URINE DRUG SCREEN, QUALITATIVE ([facility] ONLY)  URINALYSIS, ROUTINE W REFLEX MICROSCOPIC  CBG MONITORING, ED  TROPONIN I (HIGH SENSITIVITY)   ____________________________________________  12 Lead  EKG  Sinus rhythm, rate of 62 bpm.  Normal axis and intervals.  No evidence of acute ischemia. ____________________________________________  RADIOLOGY  ED MD interpretation:  CT head reviewed by me with no evidence of acute intracranial pathology.   Official radiology report(s): CT HEAD CODE STROKE WO CONTRAST  Result Date: 05/09/2021 CLINICAL DATA:  Code stroke.  Right lower extremity weakness EXAM: CT HEAD WITHOUT CONTRAST TECHNIQUE: Contiguous axial images were obtained from the base of the skull through the vertex without intravenous contrast. COMPARISON:  07/13/2020 FINDINGS: Brain: There is no mass, hemorrhage or extra-axial collection. The size and configuration of the ventricles and extra-axial CSF spaces are normal. The brain parenchyma is normal, without evidence of acute or chronic infarction. Vascular: No abnormal hyperdensity of the major intracranial arteries or dural venous sinuses. No intracranial atherosclerosis. Skull: The visualized skull base, calvarium and extracranial soft tissues are normal. Sinuses/Orbits: No fluid levels or advanced mucosal thickening of the visualized paranasal sinuses. No mastoid or middle ear effusion. The orbits are normal. ASPECTS [facility] Stroke Program Early CT Score) - Ganglionic level infarction (caudate, lentiform nuclei, internal capsule, insula, M1-M3 cortex): 7 - Supraganglionic infarction (M4-M6 cortex): 3 Total score (0-10 with 10 being normal): 10 IMPRESSION: 1. Normal head CT. 2. ASPECTS is 10. These results were called by telephone at the time of interpretation on 05/09/2021  at 10:48 pm to provider [facility] , who verbally acknowledged these results. Electronically Signed   By: Ulyses Jarred M.D.   On: 05/09/2021 22:48    ____________________________________________   PROCEDURES and INTERVENTIONS  Procedure(s) performed (including Critical Care):  .1-3 Lead EKG Interpretation  Date/Time: 05/09/2021 11:12 PM Performed by: Vladimir Crofts, MD Authorized by: Vladimir Crofts, MD     Interpretation: normal     ECG rate:  70   ECG rate assessment: normal     Rhythm: sinus rhythm     Ectopy: none     Conduction: normal    Medications  potassium chloride 10 mEq in 100 mL IVPB (10 mEq Intravenous New Bag/Given 05/09/21 2259)  lactated ringers bolus 1,000 mL (1,000 mLs Intravenous New Bag/Given 05/09/21 2214)  potassium chloride SA (KLOR-CON) CR tablet 40 mEq (40 mEq Oral Given 05/09/21 2314)    ____________________________________________   MDM / ED COURSE   70 year old woman presents to the ED after syncopal episode at home.  Hypotensive with EMS, though normotensive and with normal vital signs here in the ED.  Exam is difficult here in the ED, and seems to demonstrate focal weakness of her right leg primarily.  She has drift to her left arm and some facial asymmetry.  All her prehospital history is most concerning for vasovagal syncope in the setting of environmental heat and position changes, her exam here is concerning for the possibility of focal deficits.  Stroke alert was called and initial CT head without evidence of ICH.  Neurology seen the patient and thinks this is more likely metabolic, and recommending MRI only if patient does not improve with fluids and electrolyte repletion.  Initiating potassium repletion due to K of 2.7.  Patient signed out to on-call provider to follow-up on her reassessment  Clinical Course as of 05/09/21 2328  Wed May 09, 2021  2234 Further discussions with grandaughter at the bedside, daughter over speaker phone. Will call stroke  alert. CT alerted.  [DS]  2258 Call from neuro, less likely to be stroke. Recommends metabolic workup and treatment, MRI if no improvement after this.  [DS]  2328 Pt signed out to oncoming provider [DS]    Clinical Course User Index [DS] Vladimir Crofts, MD    ____________________________________________   FINAL CLINICAL IMPRESSION(S) / ED DIAGNOSES  Final diagnoses:  Syncope and collapse  Bad headache  Hypokalemia     ED Discharge Orders     None        Arden Axon Tamala Julian   Note:  This document was prepared using Dragon voice recognition software and may include unintentional dictation errors.    Vladimir Crofts, MD 05/09/21 3374    Vladimir Crofts, MD 05/09/21 2328

## 2021-05-09 NOTE — Consult Note (Signed)
[facility] Tele[facility]rology Consult Services   Date of Service:   05/09/2021 22:34:48  Diagnosis:       R41.82 - AMS (Altered Mental Status)  Impression:      The patient is a 70 year old woman with a history of hypertension, hyperlipidemia, CAD, who presents with generalized weakness, right leg > left likely limited by pain, in setting of hypotension. Encephalopathy favored over CVA. Recommend toxic-metabolic evaluation. If no clear etiology found or patient not improving as expected, advise MRI brain without contrast and EEG.  Metrics: Last Known Well: 05/09/2021 20:30:00 [facility] Notification Time: 05/09/2021 22:34:48 Arrival Time: 05/09/2021 22:04:00 Stamp Time: 05/09/2021 22:34:48 Initial Response Time: 05/09/2021 22:36:44 Symptoms: right sided weakness. NIHSS Start Assessment Time: 05/09/2021 22:44:37 Patient is not a candidate for Thrombolytic. Thrombolytic Medical Decision: 05/09/2021 22:53:04 Patient was not deemed candidate for Thrombolytic because of following reasons: Other Diagnosis suspected.  CT head showed no acute hemorrhage or acute core infarct. CT head was reviewed.  ED Physician notified of diagnostic impression and management plan on 05/09/2021 22:58:52  Advanced Imaging: Advanced Imaging Not Recommended because:  Clinical presentation is not suggestion of LVO or Low clinical suspicion of LVO based on presentation   Our recommendations are outlined below.  Recommendations:        Stroke/Telemetry Floor       Neuro Checks       Bedside Swallow Eval       DVT Prophylaxis       IV Fluids, Normal Saline       Head of Bed 30 Degrees       Euglycemia and Avoid Hyperthermia (PRN Acetaminophen)  Routine Consultation with [facility] for Follow up Care  Sign Out:       Discussed with Emergency Department Provider    ------------------------------------------------------------------------------  History of Present  Illness: Patient is a 70 year old Female.  Patient was brought by EMS for symptoms of right sided weakness.  Patient was sitting on porch in normal health until 8:30 PM when she reported headache and not feeling well. Became unresponsive. Brought into house. Remained unresponsive for 15 minutes before EMS called. Remained unresponsive for another 20 minutes before they arrived. BP 73/41. In ER, patient more awake and alert with Bps corrected. Reports right leg pain for past 2 days limiting strength. Otherwise exam shows lethargy and generalized weakness and tremulousness.   Past Medical History:      Hypertension      Hyperlipidemia      Coronary Artery Disease      cervical fusion  Social History: Smoking: Yes  Anticoagulant use:  No  Antiplatelet use: No  Allergies:  Reviewed Allergies Text: azithromycin    Examination: BP(123//68), Pulse(64), 1A: Level of Consciousness - Arouses to minor stimulation + 1 1B: Ask Month and Age - Both Questions Right + 0 1C: Blink Eyes & Squeeze Hands - Performs Both Tasks + 0 2: Test Horizontal Extraocular Movements - Normal + 0 3: Test Visual Fields - No Visual Loss + 0 4: Test Facial Palsy (Use Grimace if Obtunded) - Normal symmetry + 0 5A: Test Left Arm Motor Drift - Drift, but doesn't hit bed + 1 5B: Test Right Arm Motor Drift - Drift, but doesn't hit bed + 1 6A: Test Left Leg Motor Drift - No Drift for 5 Seconds + 0 6B: Test Right Leg Motor Drift - Drift, but doesn't hit bed + 1 7: Test Limb Ataxia (FNF/Heel-Shin) - No Ataxia + 0 8:  Test Sensation - Mild-Moderate Loss: Less Sharp/More Dull + 1 9: Test Language/Aphasia - Normal; No aphasia + 0 10: Test Dysarthria - Normal + 0 11: Test Extinction/Inattention - No abnormality + 0  NIHSS Score: 5  NIHSS Free Text : right leg limited by pain, decreased on right, bilateral tremors  Pre-Morbid Modified Rankin Scale: 0 Points = No symptoms at all   Patient/Family was informed the  Neurology Consult would occur via TeleHealth consult by way of interactive audio and video telecommunications and consented to receiving care in this manner.   Patient is being evaluated for possible acute neurologic impairment and high probability of imminent or life-threatening deterioration. I spent total of 30 minutes providing care to this patient, including time for face to face visit via telemedicine, review of medical records, imaging studies and discussion of findings with providers, the patient and/or family.   Dr Serita Grammes   [facility] [phone]  Case [id number]

## 2021-05-09 NOTE — ED Notes (Signed)
CBG 84. 

## 2021-05-09 NOTE — ED Notes (Signed)
Code stroke called to [facility] at [place] per Dr. Vladimir Crofts . PT in Ct

## 2021-05-09 NOTE — ED Triage Notes (Signed)
PT arrived via [facility] from daughter's house with c/o syncopal episode. Per EMS, pt was outside for about an hour before this happened. Per EMS, pt perfusely diaphoretic upon their arrival.   BP 73/41 on EMS Arrival  1200 mL fluid and 4mg  zofran given PTA  CBG 101  Per EMS, pt c/o headache - specifically jaw pain on the R side.

## 2021-05-09 NOTE — Progress Notes (Signed)
   05/09/21 2305  Clinical Encounter Type  Visited With Patient and family together  Visit Type Initial;Spiritual support;Social support;Code  Referral From Other (Comment) (code stroke)  Spiritual Encounters  Spiritual Needs Prayer;Emotional  Chaplain Burris responded to a code stroke. Attended PT after she returned from CT scan. Chaplain Burris provided a calm, compassionate presence and also hospitality (warm blanket). Chaplain Burris also checked-in with grandchild who was present with Ms. Claytor. Chaplain Burris offered prayer and communicated availability for ongoing care as needed.

## 2021-05-09 NOTE — ED Notes (Signed)
Pt taken to CT at this time.

## 2021-05-10 ENCOUNTER — Emergency Department: Payer: Medicare Other

## 2021-05-10 DIAGNOSIS — R55 Syncope and collapse: Secondary | ICD-10-CM | POA: Diagnosis not present

## 2021-05-10 DIAGNOSIS — R531 Weakness: Secondary | ICD-10-CM | POA: Diagnosis not present

## 2021-05-10 DIAGNOSIS — R29898 Other symptoms and signs involving the musculoskeletal system: Secondary | ICD-10-CM | POA: Diagnosis not present

## 2021-05-10 DIAGNOSIS — I639 Cerebral infarction, unspecified: Secondary | ICD-10-CM | POA: Diagnosis not present

## 2021-05-10 LAB — URINALYSIS, ROUTINE W REFLEX MICROSCOPIC
Bilirubin Urine: NEGATIVE
Glucose, UA: NEGATIVE mg/dL
Hgb urine dipstick: NEGATIVE
Ketones, ur: NEGATIVE mg/dL
Leukocytes,Ua: NEGATIVE
Nitrite: NEGATIVE
Protein, ur: NEGATIVE mg/dL
Specific Gravity, Urine: 1.001 — ABNORMAL LOW (ref 1.005–1.030)
pH: 7 (ref 5.0–8.0)

## 2021-05-10 LAB — URINE DRUG SCREEN, QUALITATIVE (ARMC ONLY)
Amphetamines, Ur Screen: NOT DETECTED
Barbiturates, Ur Screen: NOT DETECTED
Benzodiazepine, Ur Scrn: NOT DETECTED
Cannabinoid 50 Ng, Ur ~~LOC~~: NOT DETECTED
Cocaine Metabolite,Ur ~~LOC~~: NOT DETECTED
MDMA (Ecstasy)Ur Screen: NOT DETECTED
Methadone Scn, Ur: NOT DETECTED
Opiate, Ur Screen: NOT DETECTED
Phencyclidine (PCP) Ur S: NOT DETECTED
Tricyclic, Ur Screen: NOT DETECTED

## 2021-05-10 LAB — TROPONIN I (HIGH SENSITIVITY): Troponin I (High Sensitivity): 2 ng/L (ref <18)

## 2021-05-10 NOTE — ED Notes (Signed)
Pt returned from MRI. Reconnected to monitor and purewick by this RN. Family at bedside. Denies further needs at this time.

## 2021-05-10 NOTE — ED Notes (Signed)
Pt tolerated walking around the room well; pt states she uses cane at home.

## 2021-05-10 NOTE — ED Provider Notes (Signed)
Patient reassessed after IV fluids and electrolyte repletion.  Patient has noticeable weakness of the right upper and right lower extremity.  According to her daughter she has had some problems with her right extremities since having neck surgery several years ago.  We will pursue an MRI at this time to rule out a stroke    _________________________ 6:37 AM on 05/10/2021 ----------------------------------------- Negative for stroke.  Patient ambulating with no difficulty.  Per granddaughter was at bedside patient is at baseline.  Will discharge home with follow-up with primary care doctor.  Discussed my standard return precautions, rehydration, heat avoidance for 48 hours.   I have personally reviewed the images performed during this visit and I agree with the Radiologist's read.   Interpretation by Radiologist:  MR BRAIN WO CONTRAST  Result Date: 05/10/2021 CLINICAL DATA:  70 year old female code stroke presentation with right lower extremity weakness. EXAM: MRI HEAD WITHOUT CONTRAST TECHNIQUE: Multiplanar, multiecho pulse sequences of the brain and surrounding structures were obtained without intravenous contrast. COMPARISON:  Plain head CT 05/09/2021.  Brain MRI 02/25/2015. FINDINGS: Brain: No restricted diffusion to suggest acute infarction. No midline shift, mass effect, evidence of mass lesion, ventriculomegaly, extra-axial collection or acute intracranial hemorrhage. Cervicomedullary junction and pituitary are within normal limits. [place] Cables and white matter signal appears stable since 2016 and normal for age. No cortical encephalomalacia or chronic cerebral blood products identified. Vascular: Major intracranial vascular flow voids are stable since 2016. Skull and upper cervical spine: Extensive postoperative changes with to posterior fusion the cervical spine hardware susceptibility artifact. Progressed degenerative signal changes and/or ligamentous hypertrophy about the odontoid. Bone marrow  signal in the skull is normal. Sinuses/Orbits: Mildly Disconjugate gaze but otherwise negative. Other: Mastoids remain clear. Grossly normal visible internal auditory structures. Visualized scalp soft tissues are within normal limits. IMPRESSION: 1. No acute intracranial abnormality. Noncontrast MRI appearance of the brain remains normal for age. 2. Previous posterior cervical spine fusion with degenerative signal changes and/or ligamentous hypertrophy about the odontoid progressed since 2016. Electronically Signed   By: Genevie Ann M.D.   On: 05/10/2021 06:19   CT HEAD CODE STROKE WO CONTRAST  Result Date: 05/09/2021 CLINICAL DATA:  Code stroke.  Right lower extremity weakness EXAM: CT HEAD WITHOUT CONTRAST TECHNIQUE: Contiguous axial images were obtained from the base of the skull through the vertex without intravenous contrast. COMPARISON:  07/13/2020 FINDINGS: Brain: There is no mass, hemorrhage or extra-axial collection. The size and configuration of the ventricles and extra-axial CSF spaces are normal. The brain parenchyma is normal, without evidence of acute or chronic infarction. Vascular: No abnormal hyperdensity of the major intracranial arteries or dural venous sinuses. No intracranial atherosclerosis. Skull: The visualized skull base, calvarium and extracranial soft tissues are normal. Sinuses/Orbits: No fluid levels or advanced mucosal thickening of the visualized paranasal sinuses. No mastoid or middle ear effusion. The orbits are normal. ASPECTS [facility] Stroke Program Early CT Score) - Ganglionic level infarction (caudate, lentiform nuclei, internal capsule, insula, M1-M3 cortex): 7 - Supraganglionic infarction (M4-M6 cortex): 3 Total score (0-10 with 10 being normal): 10 IMPRESSION: 1. Normal head CT. 2. ASPECTS is 10. These results were called by telephone at the time of interpretation on 05/09/2021 at 10:48 pm to provider [facility] , who verbally acknowledged these results. Electronically Signed    By: Ulyses Jarred M.D.   On: 05/09/2021 22:48      Rudene Re, MD 05/10/21 [phone]

## 2021-05-16 ENCOUNTER — Encounter: Payer: Self-pay | Admitting: Internal Medicine

## 2021-05-16 ENCOUNTER — Other Ambulatory Visit: Payer: Self-pay

## 2021-05-16 ENCOUNTER — Ambulatory Visit (INDEPENDENT_AMBULATORY_CARE_PROVIDER_SITE_OTHER): Payer: Medicare Other | Admitting: Internal Medicine

## 2021-05-16 VITALS — BP 122/70 | HR 74 | Temp 99.1°F | Ht 61.0 in | Wt 136.0 lb

## 2021-05-16 DIAGNOSIS — E876 Hypokalemia: Secondary | ICD-10-CM | POA: Diagnosis not present

## 2021-05-16 DIAGNOSIS — I10 Essential (primary) hypertension: Secondary | ICD-10-CM

## 2021-05-16 NOTE — Progress Notes (Signed)
Date:  05/16/2021   Name:  Betty Vaughn   DOB:  09-Mar-1951   MRN:  [id number]   Chief Complaint: Hospitalization Follow-up (Pt feels ok today ) ER follow up for pre-syncope.  Seen 05/09/21. BP was low   CT and MRI brain normal.  She improved with IVF and K+ supplementation.  She is here for follow up.  She feels well today.  She believes that she was just over heated that day and had not consumed enough liquids.  Hypertension This is a chronic problem. The problem is controlled. Associated symptoms include neck pain. Pertinent negatives include no chest pain, headaches, palpitations or shortness of breath. Past treatments include calcium channel blockers and beta blockers.  Hypokalemia - long standing need for supplementation.  She has also been taking chlorthalidone for years.  Lab Results  Component Value Date   CREATININE 0.95 05/09/2021   BUN 11 05/09/2021   NA 138 05/09/2021   K 2.7 (LL) 05/09/2021   CL 107 05/09/2021   CO2 26 05/09/2021   Lab Results  Component Value Date   CHOL 154 10/13/2020   HDL 40 10/13/2020   LDLCALC 88 10/13/2020   TRIG 146 10/13/2020   CHOLHDL 3.9 10/13/2020   Lab Results  Component Value Date   TSH 2.680 10/13/2020   Lab Results  Component Value Date   HGBA1C 6.5 (H) 03/29/2021   Lab Results  Component Value Date   WBC 6.9 05/09/2021   HGB 10.7 (L) 05/09/2021   HCT 32.5 (L) 05/09/2021   MCV 96.4 05/09/2021   PLT 166 05/09/2021   Lab Results  Component Value Date   ALT 16 05/09/2021   AST 23 05/09/2021   ALKPHOS 58 05/09/2021   BILITOT 0.6 05/09/2021     Review of Systems  Constitutional:  Negative for chills, fatigue and fever.  Respiratory:  Negative for cough, chest tightness and shortness of breath.   Cardiovascular:  Negative for chest pain, palpitations and leg swelling.  Musculoskeletal:  Positive for arthralgias and neck pain.  Neurological:  Positive for weakness. Negative for dizziness, tremors, syncope,  light-headedness and headaches.   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   Multiple joint complaints 05/13/2015   Depression, major, recurrent, in partial remission ([place]) 02/23/2014   Essential hypertension 02/23/2014   Mixed hyperlipidemia 02/23/2014   Chronic headache disorder 01/16/2012   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   CAD S/P percutaneous coronary angioplasty 03/16/2010    Allergies  Allergen Reactions   Azithromycin Swelling   Propoxyphene Anaphylaxis   Tramadol Anaphylaxis   Penicillins Swelling   Piroxicam Rash   Sertraline Hcl Rash    Past Surgical History:  Procedure Laterality Date   ABDOMINAL HYSTERECTOMY     total   CERVICAL FUSION  2005   CERVICAL FUSION  01/2018   C2-T2 posterior laminectomy and fusion; removal of previous hardware   CHOLECYSTECTOMY     COLONOSCOPY WITH PROPOFOL N/A 06/12/2017  Procedure: COLONOSCOPY WITH PROPOFOL;  Surgeon: Lucilla Lame, MD;  Location: [facility];  Service: Gastroenterology;  Laterality: N/A;   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"   ESOPHAGOGASTRODUODENOSCOPY (EGD) WITH PROPOFOL N/A 08/22/2020   Procedure: ESOPHAGOGASTRODUODENOSCOPY (EGD) WITH PROPOFOL;  Surgeon: Lin Landsman, MD;  Location: [facility];  Service: Gastroenterology;  Laterality: N/A;   ESOPHAGOGASTRODUODENOSCOPY (EGD) WITH PROPOFOL N/A 11/01/2020   Procedure: ESOPHAGOGASTRODUODENOSCOPY (EGD) WITH PROPOFOL;   Surgeon: Lin Landsman, MD;  Location: [facility] ENDOSCOPY;  Service: Gastroenterology;  Laterality: N/A;   HAND SURGERY Bilateral 2001   POLYPECTOMY  2000   POLYPECTOMY N/A 06/12/2017   Procedure: POLYPECTOMY;  Surgeon: Lucilla Lame, MD;  Location: [facility];  Service: Gastroenterology;  Laterality: N/A;   rigger finger      Social History   Tobacco Use   Smoking status: Some Days    Packs/day: 0.25    Years: 30.00    Pack years: 7.50    Types: Cigarettes   Smokeless tobacco: Never  Vaping Use   Vaping Use: Every day  Substance Use Topics   Alcohol use: Yes    Alcohol/week: 1.0 standard drink    Types: 1 Glasses of wine per week   Drug use: No     Medication list has been reviewed and updated.  Current Meds  Medication Sig   Accu-Chek FastClix Lancets MISC USE TO TEST BLOOD SUGAR TWICE DAILY   ACCU-CHEK GUIDE test strip USE TO TEST BLOOD SUGAR TWICE DAILY   albuterol (VENTOLIN HFA) 108 (90 Base) MCG/ACT inhaler Inhale 2 puffs into the lungs every 6 (six) hours as needed for wheezing or shortness of breath.   amLODipine (NORVASC) 5 MG tablet TAKE 1 TABLET BY MOUTH AT BEDTIME   atorvastatin (LIPITOR) 40 MG tablet TAKE 1 TABLET BY MOUTH EVERY DAY   Blood Glucose Monitoring Suppl (ACCU-CHEK GUIDE ME) w/Device KIT TO TEST TWICE DAILY   Calcium Carbonate-Vitamin D3 600-400 MG-UNIT TABS Take 1 tablet by mouth daily.   chlorthalidone (HYGROTON) 25 MG tablet TAKE 1 TABLET BY MOUTH EVERY DAY   DULoxetine (CYMBALTA) 60 MG capsule TAKE 1 CAPSULE(60 MG) BY MOUTH DAILY   gabapentin (NEURONTIN) 300 MG capsule TAKE 2 CAPSULES(600 MG) BY MOUTH TWICE DAILY   LINZESS 145 MCG CAPS capsule TAKE 1 CAPSULE(145 MCG) BY MOUTH DAILY BEFORE AND BREAKFAST (Patient taking differently: Take 145 mcg by mouth every other day.)   loratadine (CLARITIN) 10 MG tablet Take 1 tablet (10 mg total) by mouth daily.   methocarbamol (ROBAXIN) 500 MG tablet TAKE 1 TABLET BY MOUTH FOUR TIMES DAILY    metoprolol tartrate (LOPRESSOR) 50 MG tablet TAKE 1 TABLET BY MOUTH TWICE DAILY   montelukast (SINGULAIR) 10 MG tablet Take 1 tablet (10 mg total) by mouth at bedtime.   Multiple Minerals-Vitamins (CALCIUM & VIT D3 BONE HEALTH PO) Take by mouth. 600-400 mg   nicotine polacrilex (COMMIT) 4 MG lozenge Take 1 lozenge (4 mg total) by mouth as needed for smoking cessation.   nitroGLYCERIN (NITROSTAT) 0.4 MG SL tablet Place under the tongue.   pantoprazole (PROTONIX) 40 MG tablet TAKE 1 TABLET(40 MG) BY MOUTH TWICE DAILY BEFORE A MEAL   potassium chloride SA (KLOR-CON) 20 MEQ tablet TAKE 1 TABLET BY MOUTH EVERY DAY   tamsulosin (FLOMAX) 0.4 MG CAPS capsule TAKE 1 CAPSULE(0.4 MG) BY MOUTH DAILY   traZODone (DESYREL) 50 MG tablet TAKE 1 TABLET(50 MG) BY MOUTH AT BEDTIME   [DISCONTINUED] omeprazole (PRILOSEC) 20 MG capsule  Take 20 mg by mouth 2 (two) times daily.    PHQ 2/9 Scores 05/16/2021 03/29/2021 01/25/2021 01/16/2021  PHQ - 2 Score 3 2 0 4  PHQ- 9 Score 17 18 3 9     GAD 7 : Generalized Anxiety Score 05/16/2021 03/29/2021 01/25/2021 01/16/2021  Nervous, Anxious, on Edge 2 3 1 2   Control/stop worrying 2 3 1 2   Worry too much - different things 2 2 1 2   Trouble relaxing 1 2 0 1  Restless 0 2 0 1  Easily annoyed or irritable 0 2 0 1  Afraid - awful might happen 2 1 0 0  Total GAD 7 Score 9 15 3 9   Anxiety Difficulty Not difficult at all Not difficult at all - Not difficult at all    BP Readings from Last 3 Encounters:  05/16/21 122/70  05/10/21 135/63  03/29/21 124/80    Physical Exam Vitals and nursing note reviewed.  Constitutional:      General: She is not in acute distress.    Appearance: She is well-developed.  HENT:     Head: Normocephalic and atraumatic.  Cardiovascular:     Rate and Rhythm: Normal rate and regular rhythm.     Pulses: Normal pulses.  Pulmonary:     Effort: Pulmonary effort is normal. No respiratory distress.     Breath sounds: No wheezing or rhonchi.   Musculoskeletal:        General: Tenderness present.     Cervical back: Rigidity present.     Right lower leg: No edema.     Left lower leg: No edema.  Skin:    General: Skin is warm and dry.     Findings: No rash.  Neurological:     Mental Status: She is alert and oriented to person, place, and time.  Psychiatric:        Mood and Affect: Mood normal.        Behavior: Behavior normal.    Wt Readings from Last 3 Encounters:  05/16/21 136 lb (61.7 kg)  05/09/21 120 lb (54.4 kg)  03/29/21 139 lb (63 kg)    BP 122/70   Pulse 74   Temp 99.1 F (37.3 C) (Oral)   Ht 5' 1"  (1.549 m)   Wt 136 lb (61.7 kg)   LMP  (LMP Unknown)   SpO2 (!) 74%   BMI 25.70 kg/m   Assessment and Plan: 1. Essential hypertension Clinically stable exam with well controlled BP. Recent episode with low BP and syncope likely heat and mild dehydration. Will reduce diuretic to half tablet. Recheck K+. - Basic metabolic panel  2. Hypokalemia Continue supplementation for now - Basic metabolic panel   Partially dictated using Editor, commissioning. Any errors are unintentional.  Halina Maidens, MD [facility]  05/16/2021

## 2021-05-16 NOTE — Patient Instructions (Signed)
Cut the chlorthalidone in half - take only one half per day

## 2021-05-17 LAB — BASIC METABOLIC PANEL
BUN/Creatinine Ratio: 18 (ref 12–28)
BUN: 16 mg/dL (ref 8–27)
CO2: 28 mmol/L (ref 20–29)
Calcium: 10 mg/dL (ref 8.7–10.3)
Chloride: 99 mmol/L (ref 96–106)
Creatinine, Ser: 0.89 mg/dL (ref 0.57–1.00)
Glucose: 106 mg/dL — ABNORMAL HIGH (ref 65–99)
Potassium: 4.1 mmol/L (ref 3.5–5.2)
Sodium: 140 mmol/L (ref 134–144)
eGFR: 70 mL/min/{1.73_m2} (ref >59)

## 2021-05-23 ENCOUNTER — Telehealth: Payer: Self-pay

## 2021-05-23 NOTE — Telephone Encounter (Signed)
Completed PA waiting on insurance approval.  Key: [id number]  KP

## 2021-05-25 ENCOUNTER — Emergency Department: Payer: Medicare Other

## 2021-05-25 ENCOUNTER — Other Ambulatory Visit: Payer: Self-pay

## 2021-05-25 ENCOUNTER — Emergency Department
Admission: EM | Admit: 2021-05-25 | Discharge: 2021-05-25 | Disposition: A | Discharge status: Home / Self Care | Payer: Medicare Other | Attending: Emergency Medicine | Admitting: Emergency Medicine

## 2021-05-25 DIAGNOSIS — R0902 Hypoxemia: Secondary | ICD-10-CM | POA: Diagnosis not present

## 2021-05-25 DIAGNOSIS — Z85038 Personal history of other malignant neoplasm of large intestine: Secondary | ICD-10-CM | POA: Diagnosis not present

## 2021-05-25 DIAGNOSIS — F17210 Nicotine dependence, cigarettes, uncomplicated: Secondary | ICD-10-CM | POA: Diagnosis not present

## 2021-05-25 DIAGNOSIS — M542 Cervicalgia: Secondary | ICD-10-CM | POA: Insufficient documentation

## 2021-05-25 DIAGNOSIS — G8929 Other chronic pain: Secondary | ICD-10-CM | POA: Diagnosis not present

## 2021-05-25 DIAGNOSIS — E119 Type 2 diabetes mellitus without complications: Secondary | ICD-10-CM | POA: Diagnosis not present

## 2021-05-25 DIAGNOSIS — I119 Hypertensive heart disease without heart failure: Secondary | ICD-10-CM | POA: Diagnosis not present

## 2021-05-25 DIAGNOSIS — Z79899 Other long term (current) drug therapy: Secondary | ICD-10-CM | POA: Insufficient documentation

## 2021-05-25 DIAGNOSIS — Y92410 Unspecified street and highway as the place of occurrence of the external cause: Secondary | ICD-10-CM | POA: Insufficient documentation

## 2021-05-25 DIAGNOSIS — S0990XA Unspecified injury of head, initial encounter: Secondary | ICD-10-CM | POA: Diagnosis not present

## 2021-05-25 DIAGNOSIS — Z743 Need for continuous supervision: Secondary | ICD-10-CM | POA: Diagnosis not present

## 2021-05-25 DIAGNOSIS — R519 Headache, unspecified: Secondary | ICD-10-CM | POA: Insufficient documentation

## 2021-05-25 DIAGNOSIS — R202 Paresthesia of skin: Secondary | ICD-10-CM | POA: Diagnosis not present

## 2021-05-25 DIAGNOSIS — I2510 Atherosclerotic heart disease of native coronary artery without angina pectoris: Secondary | ICD-10-CM | POA: Insufficient documentation

## 2021-05-25 DIAGNOSIS — Z041 Encounter for examination and observation following transport accident: Secondary | ICD-10-CM | POA: Diagnosis not present

## 2021-05-25 DIAGNOSIS — M25511 Pain in right shoulder: Secondary | ICD-10-CM | POA: Insufficient documentation

## 2021-05-25 DIAGNOSIS — M25519 Pain in unspecified shoulder: Secondary | ICD-10-CM | POA: Diagnosis not present

## 2021-05-25 MED ORDER — CYCLOBENZAPRINE HCL 10 MG PO TABS
5.0000 mg | ORAL_TABLET | Freq: Once | ORAL | Status: AC
  Administered 2021-05-25: 5 mg via ORAL
  Filled 2021-05-25: qty 1

## 2021-05-25 MED ORDER — CYCLOBENZAPRINE HCL 5 MG PO TABS: 5.0000 mg | ORAL_TABLET | Freq: Three times a day (TID) | ORAL | 0 refills | Status: AC | PRN

## 2021-05-25 MED ORDER — ACETAMINOPHEN 325 MG PO TABS
650.0000 mg | ORAL_TABLET | Freq: Once | ORAL | Status: AC
  Administered 2021-05-25: 650 mg via ORAL
  Filled 2021-05-25: qty 2
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
## 2021-05-25 NOTE — Telephone Encounter (Signed)
Approved through 11/24/2021.  KP
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
## 2021-05-25 NOTE — Discharge Instructions (Addendum)
You can take Flexeril and Tylenol as needed for pain.

## 2021-06-02 ENCOUNTER — Other Ambulatory Visit: Payer: Self-pay | Admitting: Internal Medicine

## 2021-06-02 DIAGNOSIS — F5104 Psychophysiologic insomnia: Secondary | ICD-10-CM

## 2021-06-20 ENCOUNTER — Other Ambulatory Visit: Payer: Self-pay | Admitting: Internal Medicine

## 2021-06-20 DIAGNOSIS — G629 Polyneuropathy, unspecified: Secondary | ICD-10-CM
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
## 2021-06-25 ENCOUNTER — Ambulatory Visit: Payer: Medicare Other

## 2021-07-11 ENCOUNTER — Ambulatory Visit (INDEPENDENT_AMBULATORY_CARE_PROVIDER_SITE_OTHER): Payer: Medicare Other

## 2021-07-11 DIAGNOSIS — Z0000 Encounter for general adult medical examination without abnormal findings: Secondary | ICD-10-CM

## 2021-07-11 DIAGNOSIS — Z599 Problem related to housing and economic circumstances, unspecified: Secondary | ICD-10-CM | POA: Diagnosis not present

## 2021-07-11 NOTE — Patient Instructions (Signed)
Betty Vaughn , Thank you for taking time to come for your Medicare Wellness Visit. I appreciate your ongoing commitment to your health goals. Please review the following plan we discussed and let me know if I can assist you in the future.   Screening recommendations/referrals: Colonoscopy: done 06/12/17. Repeat 05/2022 Mammogram: done 09/25/20 Bone Density: done 09/25/20 Recommended yearly ophthalmology/optometry visit for glaucoma screening and checkup Recommended yearly dental visit for hygiene and checkup  Vaccinations: Influenza vaccine: done 08/15/20 Pneumococcal vaccine: done 06/21/19 Tdap vaccine: done 04/11/16 Shingles vaccine: Shingrix discussed. Please contact your pharmacy for coverage information.  Covid-19:done 01/21/20, 02/18/20 & 10/04/20  Advanced directives: Advance directive discussed with you today. Even though you declined this today please call our office should you change your mind and we can give you the proper paperwork for you to fill out.   Conditions/risks identified: If you wish to quit smoking, help is available. For free tobacco cessation program offerings call the [facility] at (915)198-9935 or Live Well Line at (720) 235-3661. You may also visit [email or web address] or email [email or web address] for more information on other programs.   Next appointment: Follow up in one year for your annual wellness visit    Preventive Care 70 Years and Older, Female Preventive care refers to lifestyle choices and visits with your health care provider that can promote health and wellness. What does preventive care include? A yearly physical exam. This is also called an annual well check. Dental exams once or twice a year. Routine eye exams. Ask your health care provider how often you should have your eyes checked. Personal lifestyle choices, including: Daily care of your teeth and gums. Regular physical activity. Eating a healthy diet. Avoiding tobacco and  drug use. Limiting alcohol use. Practicing safe sex. Taking low-dose aspirin every day. Taking vitamin and mineral supplements as recommended by your health care provider. What happens during an annual well check? The services and screenings done by your health care provider during your annual well check will depend on your age, overall health, lifestyle risk factors, and family history of disease. Counseling  Your health care provider may ask you questions about your: Alcohol use. Tobacco use. Drug use. Emotional well-being. Home and relationship well-being. Sexual activity. Eating habits. History of falls. Memory and ability to understand (cognition). Work and work Statistician. Reproductive health. Screening  You may have the following tests or measurements: Height, weight, and BMI. Blood pressure. Lipid and cholesterol levels. These may be checked every 5 years, or more frequently if you are over 43 years old. Skin check. Lung cancer screening. You may have this screening every year starting at age 70 if you have a 30-pack-year history of smoking and currently smoke or have quit within the past 15 years. Fecal occult blood test (FOBT) of the stool. You may have this test every year starting at age 70. Flexible sigmoidoscopy or colonoscopy. You may have a sigmoidoscopy every 5 years or a colonoscopy every 10 years starting at age 70. Hepatitis C blood test. Hepatitis B blood test. Sexually transmitted disease (STD) testing. Diabetes screening. This is done by checking your blood sugar (glucose) after you have not eaten for a while (fasting). You may have this done every 1-3 years. Bone density scan. This is done to screen for osteoporosis. You may have this done starting at age 70. Mammogram. This may be done every 1-2 years. Talk to your health care provider about how often you should have regular mammograms. Talk with  your health care provider about your test results, treatment  options, and if necessary, the need for more tests. Vaccines  Your health care provider may recommend certain vaccines, such as: Influenza vaccine. This is recommended every year. Tetanus, diphtheria, and acellular pertussis (Tdap, Td) vaccine. You may need a Td booster every 10 years. Zoster vaccine. You may need this after age 45. Pneumococcal 13-valent conjugate (PCV13) vaccine. One dose is recommended after age 73. Pneumococcal polysaccharide (PPSV23) vaccine. One dose is recommended after age 21. Talk to your health care provider about which screenings and vaccines you need and how often you need them. This information is not intended to replace advice given to you by your health care provider. Make sure you discuss any questions you have with your health care provider. Document Released: 12/08/2015 Document Revised: 07/31/2016 Document Reviewed: 09/12/2015 Elsevier Interactive Patient Education  2017 [place] Prevention in the Home Falls can cause injuries. They can happen to people of all ages. There are many things you can do to make your home safe and to help prevent falls. What can I do on the outside of my home? Regularly fix the edges of walkways and driveways and fix any cracks. Remove anything that might make you trip as you walk through a door, such as a raised step or threshold. Trim any bushes or trees on the path to your home. Use bright outdoor lighting. Clear any walking paths of anything that might make someone trip, such as rocks or tools. Regularly check to see if handrails are loose or broken. Make sure that both sides of any steps have handrails. Any raised decks and porches should have guardrails on the edges. Have any leaves, snow, or ice cleared regularly. Use sand or salt on walking paths during winter. Clean up any spills in your garage right away. This includes oil or grease spills. What can I do in the bathroom? Use night lights. Install grab  bars by the toilet and in the tub and shower. Do not use towel bars as grab bars. Use non-skid mats or decals in the tub or shower. If you need to sit down in the shower, use a plastic, non-slip stool. Keep the floor dry. Clean up any water that spills on the floor as soon as it happens. Remove soap buildup in the tub or shower regularly. Attach bath mats securely with double-sided non-slip rug tape. Do not have throw rugs and other things on the floor that can make you trip. What can I do in the bedroom? Use night lights. Make sure that you have a light by your bed that is easy to reach. Do not use any sheets or blankets that are too big for your bed. They should not hang down onto the floor. Have a firm chair that has side arms. You can use this for support while you get dressed. Do not have throw rugs and other things on the floor that can make you trip. What can I do in the kitchen? Clean up any spills right away. Avoid walking on wet floors. Keep items that you use a lot in easy-to-reach places. If you need to reach something above you, use a strong step stool that has a grab bar. Keep electrical cords out of the way. Do not use floor polish or wax that makes floors slippery. If you must use wax, use non-skid floor wax. Do not have throw rugs and other things on the floor that can make you trip.  What can I do with my stairs? Do not leave any items on the stairs. Make sure that there are handrails on both sides of the stairs and use them. Fix handrails that are broken or loose. Make sure that handrails are as long as the stairways. Check any carpeting to make sure that it is firmly attached to the stairs. Fix any carpet that is loose or worn. Avoid having throw rugs at the top or bottom of the stairs. If you do have throw rugs, attach them to the floor with carpet tape. Make sure that you have a light switch at the top of the stairs and the bottom of the stairs. If you do not have them,  ask someone to add them for you. What else can I do to help prevent falls? Wear shoes that: Do not have high heels. Have rubber bottoms. Are comfortable and fit you well. Are closed at the toe. Do not wear sandals. If you use a stepladder: Make sure that it is fully opened. Do not climb a closed stepladder. Make sure that both sides of the stepladder are locked into place. Ask someone to hold it for you, if possible. Clearly mark and make sure that you can see: Any grab bars or handrails. First and last steps. Where the edge of each step is. Use tools that help you move around (mobility aids) if they are needed. These include: Canes. Walkers. Scooters. Crutches. Turn on the lights when you go into a dark area. Replace any light bulbs as soon as they burn out. Set up your furniture so you have a clear path. Avoid moving your furniture around. If any of your floors are uneven, fix them. If there are any pets around you, be aware of where they are. Review your medicines with your doctor. Some medicines can make you feel dizzy. This can increase your chance of falling. Ask your doctor what other things that you can do to help prevent falls. This information is not intended to replace advice given to you by your health care provider. Make sure you discuss any questions you have with your health care provider. Document Released: 09/07/2009 Document Revised: 04/18/2016 Document Reviewed: 12/16/2014 Elsevier Interactive Patient Education  2017 [employer].

## 2021-07-11 NOTE — Progress Notes (Signed)
Subjective:   Betty Vaughn is a 70 y.o. female who presents for Medicare Annual (Subsequent) preventive examination.  Virtual Visit via Telephone Note  I connected with  [name] on 07/11/21 at  2:00 PM EDT by telephone and verified that I am speaking with the correct person using two identifiers.  Location: Patient: home Provider: [facility] Persons participating in the virtual visit: [place]   I discussed the limitations, risks, security and privacy concerns of performing an evaluation and management service by telephone and the availability of in person appointments. The patient expressed understanding and agreed to proceed.  Interactive audio and video telecommunications were attempted between this nurse and patient, however failed, due to patient having technical difficulties OR patient did not have access to video capability.  We continued and completed visit with audio only.  Some vital signs may be absent or patient reported.   Clemetine Marker, LPN   Review of Systems     Cardiac Risk Factors include: advanced age (>86mn, >>40women);diabetes mellitus;dyslipidemia;hypertension;smoking/ tobacco exposure     Objective:    There were no vitals filed for this visit. There is no height or weight on file to calculate BMI.  Advanced Directives 07/11/2021 05/25/2021 05/09/2021 11/01/2020 08/22/2020 08/01/2020 08/01/2020  Does Patient Have a Medical Advance Directive? _0  No No  Type of Advance Directive - - - - - - -  Copy of HJ[place]in Chart? - - - - - - -  Would patient like information on creating a medical advance directive? No - Patient declined No - Patient declined No - Patient declined - - No - Patient declined No - Patient declined    Current Medications (verified) Outpatient Encounter Medications as of 07/11/2021  Medication Sig   Accu-Chek FastClix Lancets MISC USE TO TEST BLOOD SUGAR TWICE DAILY   ACCU-CHEK GUIDE  test strip USE TO TEST BLOOD SUGAR TWICE DAILY   albuterol (VENTOLIN HFA) 108 (90 Base) MCG/ACT inhaler Inhale 2 puffs into the lungs every 6 (six) hours as needed for wheezing or shortness of breath.   amLODipine (NORVASC) 5 MG tablet TAKE 1 TABLET BY MOUTH AT BEDTIME   atorvastatin (LIPITOR) 40 MG tablet TAKE 1 TABLET BY MOUTH EVERY DAY   Blood Glucose Monitoring Suppl (ACCU-CHEK GUIDE ME) w/Device KIT TO TEST TWICE DAILY   Calcium Carbonate-Vitamin D3 600-400 MG-UNIT TABS Take 1 tablet by mouth daily.   chlorthalidone (HYGROTON) 25 MG tablet TAKE 1 TABLET BY MOUTH EVERY DAY   DULoxetine (CYMBALTA) 60 MG capsule TAKE 1 CAPSULE(60 MG) BY MOUTH DAILY   gabapentin (NEURONTIN) 300 MG capsule TAKE 2 CAPSULES(600 MG) BY MOUTH TWICE DAILY   LINZESS 145 MCG CAPS capsule TAKE 1 CAPSULE(145 MCG) BY MOUTH DAILY BEFORE AND BREAKFAST (Patient taking differently: Take 145 mcg by mouth every other day.)   loratadine (CLARITIN) 10 MG tablet Take 1 tablet (10 mg total) by mouth daily.   methocarbamol (ROBAXIN) 500 MG tablet TAKE 1 TABLET BY MOUTH FOUR TIMES DAILY   metoprolol tartrate (LOPRESSOR) 50 MG tablet TAKE 1 TABLET BY MOUTH TWICE DAILY   montelukast (SINGULAIR) 10 MG tablet Take 1 tablet (10 mg total) by mouth at bedtime.   Multiple Vitamins-Minerals (WOMENS MULTIVITAMIN PO) Take by mouth.   nicotine polacrilex (COMMIT) 4 MG lozenge Take 1 lozenge (4 mg total) by mouth as needed for smoking cessation.   nitroGLYCERIN (NITROSTAT) 0.4 MG SL tablet Place under the tongue.   pantoprazole (PROTONIX)  40 MG tablet TAKE 1 TABLET(40 MG) BY MOUTH TWICE DAILY BEFORE A MEAL   potassium chloride SA (KLOR-CON) 20 MEQ tablet TAKE 1 TABLET BY MOUTH EVERY DAY   tamsulosin (FLOMAX) 0.4 MG CAPS capsule TAKE 1 CAPSULE(0.4 MG) BY MOUTH DAILY   traZODone (DESYREL) 50 MG tablet TAKE 1 TABLET(50 MG) BY MOUTH AT BEDTIME   [DISCONTINUED] Multiple Minerals-Vitamins (CALCIUM & VIT D3 BONE HEALTH PO) Take by mouth. 600-400 mg    No facility-administered encounter medications on file as of 07/11/2021.    Allergies (verified) Azithromycin, Propoxyphene, Tramadol, Penicillins, Piroxicam, and Sertraline hcl   History: Past Medical History:  Diagnosis Date   Allergy    Arthritis    Chronic insomnia    Colon cancer (HCC)    polyp was cancerous   Depression    Diabetes mellitus without complication (HCC)    GERD (gastroesophageal reflux disease)    Hx of colonic polyps    Hyperlipidemia    Hypertension    Neuromuscular disorder (HCC)    Perforated gastric ulcer ([place]) 08/01/2020   Psoriasis    scalp   Past Surgical History:  Procedure Laterality Date   ABDOMINAL HYSTERECTOMY     total   CERVICAL FUSION  2005   CERVICAL FUSION  01/2018   C2-T2 posterior laminectomy and fusion; removal of previous hardware   CHOLECYSTECTOMY     COLONOSCOPY WITH PROPOFOL N/A 06/12/2017   Procedure: COLONOSCOPY WITH PROPOFOL;  Surgeon: Lucilla Lame, MD;  Location: [place];  Service: Gastroenterology;  Laterality: N/A;   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"   ESOPHAGOGASTRODUODENOSCOPY (EGD) WITH PROPOFOL N/A 08/22/2020   Procedure: ESOPHAGOGASTRODUODENOSCOPY (EGD) WITH PROPOFOL;  Surgeon: Lin Landsman, MD;  Location: [place];  Service: Gastroenterology;  Laterality: N/A;   ESOPHAGOGASTRODUODENOSCOPY (EGD) WITH PROPOFOL N/A 11/01/2020   Procedure: ESOPHAGOGASTRODUODENOSCOPY (EGD) WITH PROPOFOL;  Surgeon: Lin Landsman, MD;  [facility] ENDOSCOPY;  Service: Gastroenterology;  Laterality: N/A;   HAND SURGERY Bilateral 2001   POLYPECTOMY  2000   POLYPECTOMY N/A 06/12/2017   Procedure: POLYPECTOMY;  Surgeon: Lucilla Lame, MD;  Location: [place];  Service: Gastroenterology;  Laterality: N/A;   rigger finger     Family History  Problem Relation Age of Onset   Breast cancer Mother 52   Diabetes Mother    CAD Father    Heart attack Father    Diabetes Daughter    Kidney  failure Daughter    Breast cancer Sister 26   Sickle cell anemia Other    Cancer Brother        lung   Heart attack Sister    Cancer Sister        ovarian   Kidney cancer Neg Hx    Bladder Cancer Neg Hx    Social History   Socioeconomic History   Marital status: Divorced    Spouse name: Not on file   Number of children: 3   Years of education: Not on file   Highest education level: 12th grade  Occupational History   Occupation: Retired  Tobacco Use   Smoking status: Every Day    Packs/day: 0.25    Years: 30.00    Pack years: 7.50    Types: Cigarettes   Smokeless tobacco: Never   Tobacco comments:    Using nicotine lozenges  Vaping Use   Vaping Use: Former  Substance and Sexual Activity   Alcohol use: Yes    Alcohol/week: 1.0 standard drink  Types: 1 Glasses of wine per week    Comment: occasionally   Drug use: No   Sexual activity: Not Currently  Other Topics Concern   Not on file  Social History Narrative   Pt lives alone   Social Determinants of Health   Financial Resource Strain: High Risk   Difficulty of Paying Living Expenses: Hard  Food Insecurity: No Food Insecurity   Worried About Charity fundraiser in the Last Year: Never true   Ran Out of Food in the Last Year: Never true  Transportation Needs: No Transportation Needs   Lack of Transportation (Medical): No   Lack of Transportation (Non-Medical): No  Physical Activity: Sufficiently Active   Days of Exercise per Week: 7 days   Minutes of Exercise per Session: 30 min  Stress: Stress Concern Present   Feeling of Stress : To some extent  Social Connections: Moderately Isolated   Frequency of Communication with Friends and Family: More than three times a week   Frequency of Social Gatherings with Friends and Family: Once a week   Attends Religious Services: More than 4 times per year   Active Member of [employer] or Organizations: No   Attends Music therapist: Never   Marital Status:  Divorced    Tobacco Counseling Ready to quit: Yes Counseling given: Not Answered Tobacco comments: Using nicotine lozenges   Clinical Intake:  Pre-visit preparation completed: Yes  Pain : No/denies pain     Nutritional Risks: None Diabetes: Yes CBG done?: No Did pt. bring in CBG monitor from home?: No  How often do you need to have someone help you when you read instructions, pamphlets, or other written materials from your doctor or pharmacy?: 1 - Never  Nutrition Risk Assessment:  Has the patient had any N/V/D within the last 2 months?  No  Does the patient have any non-healing wounds?  No  Has the patient had any unintentional weight loss or weight gain?  No   Diabetes:  Is the patient diabetic?  Yes  If diabetic, was a CBG obtained today?  No  Did the patient bring in their glucometer from home?  No  How often do you monitor your CBG's? Daily; today 120.   Financial Strains and Diabetes Management:  Are you having any financial strains with the device, your supplies or your medication? No .  Does the patient want to be seen by Chronic Care Management for management of their diabetes?  No  Would the patient like to be referred to a Nutritionist or for Diabetic Management?  No   Diabetic Exams:  Diabetic Eye Exam: Completed 02/07/21 negative retinopathy.   Diabetic Foot Exam: DUE. Pt has been advised about the importance in completing this exam. Pt is scheduled for diabetic foot exam on 10/01/21.    Interpreter Needed?: No  Information entered by :: Clemetine Marker LPN   Activities of Daily Living In your present state of health, do you have any difficulty performing the following activities: 07/11/2021 05/16/2021  Hearing? N N  Vision? N N  Difficulty concentrating or making decisions? Tempie Donning  Walking or climbing stairs? Y Y  Dressing or bathing? N N  Doing errands, shopping? N N  Preparing Food and eating ? N -  Using the Toilet? N -  In the past six months,  have you accidently leaked urine? N -  Do you have problems with loss of bowel control? N -  Managing your Medications? N -  Managing your Finances? N -  Housekeeping or managing your Housekeeping? N -  Some recent data might be hidden    Patient Care Team: Glean Hess, MD as PCP - General (Internal Medicine) Bayard Hugger, MD as Consulting Physician (Neurosurgery) Laneta Simmers as Physician Assistant (Urology)  Indicate any recent Medical Services you may have received from other than [facility] providers in the past year (date may be approximate).     Assessment:   This is a routine wellness examination for [place].  Hearing/Vision screen Hearing Screening - Comments:: Pt denies hearing difficulty Vision Screening - Comments:: [facility] screen[facility] at [facility]  Dietary issues and exercise activities discussed: Current Exercise Habits: Home exercise routine, Type of exercise: walking, Time (Minutes): 30, Frequency (Times/Week): 7, Weekly Exercise (Minutes/Week): 210, Intensity: Mild, Exercise limited by: None identified   Goals Addressed             This Visit's Progress    DIET - INCREASE WATER INTAKE   On track    Recommend to drink at least 6-8 8oz glasses of water per day.     Quit Smoking       If you wish to quit smoking, help is available. For free tobacco cessation program offerings call the [facility] at 334 545 9889 or Live Well Line at 862-526-8009. You may also visit [email or web address] or email [email or web address] for more information on other programs.         Depression Screen PHQ 2/9 Scores 07/11/2021 05/16/2021 03/29/2021 01/25/2021 01/16/2021 10/13/2020 06/21/2020  PHQ - 2 Score _1 0 4 0 2  PHQ- 9 Score _2 0 8    Fall Risk Fall Risk  07/11/2021 05/16/2021 03/29/2021 01/25/2021 01/16/2021  Falls in the past year? 1 0 1 0 0  Number falls in past yr: 0 0 1 - 0  Injury with Fall? 0 0 0 - 0  Risk  for fall due to : No Fall Risks - - - -  Risk for fall due to: Comment - - - - -  Follow up Falls prevention discussed Falls evaluation completed Falls evaluation completed Falls evaluation completed Falls evaluation completed    [place]:  Any stairs in or around the home? Yes  If so, are there any without handrails? No  Home free of loose throw rugs in walkways, pet beds, electrical cords, etc? Yes  Adequate lighting in your home to reduce risk of falls? Yes   ASSISTIVE DEVICES UTILIZED TO PREVENT FALLS:  Life alert? No  Use of a cane, walker or w/c? No  Grab bars in the bathroom? No  Shower chair or bench in shower? Yes  Elevated toilet seat or a handicapped toilet? Yes   TIMED UP AND GO:  Was the test performed? No . Telephonic visit  Cognitive Function: Normal cognitive status assessed by direct observation by this Nurse Health Advisor. No abnormalities found.       6CIT Screen 06/21/2020 04/28/2017  What Year? 0 points 0 points  What month? 0 points 0 points  What time? 0 points 0 points  Count back from 20 0 points 0 points  Months in reverse 0 points 0 points  Repeat phrase 2 points 0 points  Total Score 2 0    Immunizations Immunization History  Administered Date(s) Administered   Fluad Quad(high Dose 65+) 08/15/2020   H1N1 07/31/2017   Influenza, High  Dose Seasonal PF 08/29/2017, 09/22/2018, 08/27/2019   Influenza-Unspecified 08/29/2017   Moderna Sars-Covid-2 Vaccination 01/21/2020, 02/18/2020, 10/04/2020   PPD Test 06/19/2015   Pneumococcal Conjugate-13 06/17/2018   Pneumococcal Polysaccharide-23 06/21/2019   Tdap 04/11/2016   Zoster, Live 06/12/2016    TDAP status: Up to date  Flu Vaccine status: Up to date  Pneumococcal vaccine status: Up to date  Covid-19 vaccine status: Completed vaccines  Qualifies for Shingles Vaccine? Yes   Zostavax completed Yes   Shingrix Completed?: No.    Education has been provided  regarding the importance of this vaccine. Patient has been advised to call insurance company to determine out of pocket expense if they have not yet received this vaccine. Advised may also receive vaccine at local pharmacy or Health Dept. Verbalized acceptance and understanding.  Screening Tests Health Maintenance  Topic Date Due   FOOT EXAM  Never done   Zoster Vaccines- Shingrix (1 of 2) Never done   COVID-19 Vaccine (4 - Booster for Moderna series) 02/01/2021   INFLUENZA VACCINE  06/25/2021   MAMMOGRAM  09/25/2021   HEMOGLOBIN A1C  09/29/2021   OPHTHALMOLOGY EXAM  02/07/2022   COLONOSCOPY (Pts 45-31yr Insurance coverage will need to be confirmed)  06/12/2022   TETANUS/TDAP  04/11/2026   DEXA SCAN  Completed   Hepatitis C Screening  Completed   PNA vac Low Risk Adult  Completed   HPV VACCINES  Aged Out   URINE MICROALBUMIN  Discontinued    Health Maintenance  Health Maintenance Due  Topic Date Due   FOOT EXAM  Never done   Zoster Vaccines- Shingrix (1 of 2) Never done   COVID-19 Vaccine (4 - Booster for Moderna series) 02/01/2021   INFLUENZA VACCINE  06/25/2021    Colorectal cancer screening: Type of screening: Colonoscopy. Completed 06/12/17. Repeat every 5 years  Mammogram status: Completed 09/25/20. Repeat every year  Bone Density status: Completed 09/25/20. Results reflect: Bone density results: OSTEOPENIA. Repeat every 2 years.  Lung Cancer Screening: (Low Dose CT Chest recommended if Age 70-80years, 30 pack-year currently smoking OR have quit w/in 15years.) does not qualify.   Additional Screening:  Hepatitis C Screening: does qualify; Completed 04/10/16  Vision Screening: Recommended annual ophthalmology exams for early detection of glaucoma and other disorders of the eye. Is the patient up to date with their annual eye exam?  Yes  Who is the provider or what is the name of the office in which the patient attends annual [facility] exams[facility]   Dental  Screening: Recommended annual dental exams for proper oral hygiene  Community Resource Referral / Chronic Care Management: CRR required this visit?  Yes   CCM required this visit?  No      Plan:     I have personally reviewed and noted the following in the patient's chart:   Medical and social history Use of alcohol, tobacco or illicit drugs  Current medications and supplements including opioid prescriptions.  Functional ability and status Nutritional status Physical activity Advanced directives List of other physicians Hospitalizations, surgeries, and ER visits in previous 12 months Vitals Screenings to include cognitive, depression, and falls Referrals and appointments  In addition, I have reviewed and discussed with patient certain preventive protocols, quality metrics, and best practice recommendations. A written personalized care plan for preventive services as well as general preventive health recommendations were provided to patient.     KClemetine Marker LPN   [id number]  Nurse Notes: none

## 2021-07-18 ENCOUNTER — Telehealth: Payer: Self-pay | Admitting: Internal Medicine

## 2021-07-18 NOTE — Telephone Encounter (Signed)
   Telephone encounter was:  Unsuccessful.  07/18/2021 Name: Betty Vaughn MRN: [id number] DOB: [birth year]  Unsuccessful outbound call made today to assist with:  Financial Difficulties related to paying utilities.  Outreach Attempt:  1st Attempt  A HIPAA compliant voice message was left requesting a return call.  Instructed patient to call back at [phone].  April Green Care Guide, Embedded Care Coordination [place], Care Management Phone: [phone] Email: [email or web address]

## 2021-07-20 ENCOUNTER — Other Ambulatory Visit: Payer: Self-pay | Admitting: Internal Medicine

## 2021-07-25 ENCOUNTER — Other Ambulatory Visit: Payer: Self-pay | Admitting: Internal Medicine

## 2021-07-25 DIAGNOSIS — J3089 Other allergic rhinitis: Secondary | ICD-10-CM

## 2021-07-25 NOTE — Telephone Encounter (Signed)
Requested Prescriptions  Pending Prescriptions Disp Refills  . loratadine (CLARITIN) 10 MG tablet [Pharmacy Med Name: LORATADINE '10MG'$  TABLETS] 90 tablet 0    Sig: TAKE 1 TABLET(10 MG) BY MOUTH DAILY     Ear, Nose, and Throat:  Antihistamines Passed - 07/25/2021  6:31 AM      Passed - Valid encounter within last 12 months    Recent Outpatient Visits          2 months ago Essential hypertension   [facility] Glean Hess, MD   3 months ago Essential hypertension   [facility] Glean Hess, MD   6 months ago Tobacco use disorder   [facility] Glean Hess, MD   6 months ago Lumbar disc herniation with radiculopathy   [facility] Glean Hess, MD   9 months ago Annual physical exam   [facility] Glean Hess, MD      Future Appointments            In 2 months [name] [name], MD [facility], [place]   In 2 months [name], [name], MD [facility], [facility]

## 2021-08-01 ENCOUNTER — Telehealth: Payer: Self-pay | Admitting: Internal Medicine

## 2021-08-01 NOTE — Telephone Encounter (Signed)
   Telephone encounter was:  Successful.  08/01/2021 Name: Rosalynn Molyneaux MRN: [id number] DOB: 1951/07/15  Betzabeth Cavaness is a 70 y.o. year old female who is a primary care patient of [name] [name], MD . The community resource team was consulted for assistance with Financial Difficulties related to paying utilities.  Care guide performed the following interventions: Patient provided with information about care guide support team and interviewed to confirm resource needs Patient states she is ok right now with her utility bills. I am mailing her a list of resources that she can reach out to if she gets into a bind in the future .  Follow Up Plan:  No further follow up planned at this time. The patient has been provided with needed resources.  April Green Care Guide, Embedded Care Coordination [place], Care Management Phone: [phone] Email: [email or web address]

## 2021-08-19 ENCOUNTER — Other Ambulatory Visit: Payer: Self-pay | Admitting: Internal Medicine

## 2021-08-19 DIAGNOSIS — F5104 Psychophysiologic insomnia: Secondary | ICD-10-CM

## 2021-08-19 NOTE — Telephone Encounter (Signed)
Future OV 10/01/21   Approved per protocol.

## 2021-09-18 ENCOUNTER — Other Ambulatory Visit: Payer: Self-pay | Admitting: Internal Medicine

## 2021-09-18 DIAGNOSIS — G629 Polyneuropathy, unspecified: Secondary | ICD-10-CM

## 2021-09-18 DIAGNOSIS — Z9109 Other allergy status, other than to drugs and biological substances: Secondary | ICD-10-CM

## 2021-09-18 NOTE — Telephone Encounter (Signed)
Requested Prescriptions  Pending Prescriptions Disp Refills  . montelukast (SINGULAIR) 10 MG tablet [Pharmacy Med Name: MONTELUKAST 10MG  TABLETS] 90 tablet 1    Sig: TAKE 1 TABLET(10 MG) BY MOUTH AT BEDTIME     Pulmonology:  Leukotriene Inhibitors Passed - 09/18/2021  6:28 AM      Passed - Valid encounter within last 12 months    Recent Outpatient Visits          4 months ago Essential hypertension   [facility] Glean Hess, MD   5 months ago Essential hypertension   [facility] Glean Hess, MD   7 months ago Tobacco use disorder   [facility] Glean Hess, MD   8 months ago Lumbar disc herniation with radiculopathy   [facility] Glean Hess, MD   11 months ago Annual physical exam   [facility] Glean Hess, MD      Future Appointments            In 1 week [name] [name], MD [facility], [place]   In 4 weeks [name] [name], MD [facility], [facility]           . gabapentin (NEURONTIN) 300 MG capsule [Pharmacy Med Name: GABAPENTIN 300MG  CAPSULES] 360 capsule 0    Sig: TAKE 2 CAPSULES(600 MG) BY MOUTH TWICE DAILY     Neurology: Anticonvulsants - gabapentin Passed - 09/18/2021  6:28 AM      Passed - Valid encounter within last 12 months    Recent Outpatient Visits          4 months ago Essential hypertension   [facility] Glean Hess, MD   5 months ago Essential hypertension   [facility] Glean Hess, MD   7 months ago Tobacco use disorder   [facility] Glean Hess, MD   8 months ago Lumbar disc herniation with radiculopathy   [facility] Glean Hess, MD   11 months ago Annual physical exam   [facility] Glean Hess, MD      Future Appointments            In 1 week [name] [name], MD [facility], [place]   In 4 weeks Glean Hess, MD [facility], [place]           .  DULoxetine (CYMBALTA) 60 MG capsule [Pharmacy Med Name: DULOXETINE DR 60MG  CAPSULES] 90 capsule 0    Sig: TAKE 1 CAPSULE(60 MG) BY MOUTH DAILY     Psychiatry: Antidepressants - SNRI Passed - 09/18/2021  6:28 AM      Passed - Completed PHQ-2 or PHQ-9 in the last 360 days      Passed - Last BP in normal range    BP Readings from Last 1 Encounters:  05/25/21 130/82         Passed - Valid encounter within last 6 months    Recent Outpatient Visits          4 months ago Essential hypertension   [facility] Glean [facility], MD   5 months ago Essential hypertension   [facility] Glean Hess, MD   7 months ago Tobacco use disorder   [facility] Glean Hess, MD   8 months ago Lumbar disc herniation with radiculopathy   [facility] Glean Hess, MD   11 months ago  Annual physical exam   [facility] Glean Hess, MD      Future Appointments            In 1 week [name] [name], MD [facility], [place]   In 4 weeks [name] [name], MD [facility], [facility]

## 2021-09-23 ENCOUNTER — Other Ambulatory Visit: Payer: Self-pay | Admitting: Internal Medicine

## 2021-09-23 ENCOUNTER — Other Ambulatory Visit: Payer: Self-pay | Admitting: Gastroenterology

## 2021-09-23 DIAGNOSIS — R3911 Hesitancy of micturition: Secondary | ICD-10-CM

## 2021-09-23 NOTE — Telephone Encounter (Signed)
Requested medication (s) are due for refill today: yes  Requested medication (s) are on the active medication list: yes  Last refill:  03/27/21 #90 1 RF  Future visit scheduled: yes  Notes to clinic:  med not assigned to a protocol   Requested Prescriptions  Pending Prescriptions Disp Refills   Calcium Carb-Cholecalciferol 600-10 MG-MCG TABS [Pharmacy Med Name: CALCIUM 600+D (400U) TABLETS] 90 tablet     Sig: TAKE 1 TABLET BY MOUTH DAILY     There is no refill protocol information for this order    Signed Prescriptions Disp Refills   tamsulosin (FLOMAX) 0.4 MG CAPS capsule 90 capsule 0    Sig: TAKE 1 CAPSULE(0.4 MG) BY MOUTH DAILY     Urology: Alpha-Adrenergic Blocker Passed - 09/23/2021  6:28 AM      Passed - Last BP in normal range    BP Readings from Last 1 Encounters:  05/25/21 130/82          Passed - Valid encounter within last 12 months    Recent Outpatient Visits           4 months ago Essential hypertension   [place], Laura H, MD   5 months ago Essential hypertension   [place], Laura H, MD   8 months ago Tobacco use disorder   [facility] Glean Hess, MD   8 months ago Lumbar disc herniation with radiculopathy   [facility] Glean Hess, MD   11 months ago Annual physical exam   [facility] Glean Hess, MD       Future Appointments             In 1 week [name] [name], MD [facility], [place]   In 3 weeks [name] [name], MD [facility], [place] [facility]

## 2021-09-23 NOTE — Telephone Encounter (Signed)
Requested Prescriptions  Pending Prescriptions Disp Refills  . tamsulosin (FLOMAX) 0.4 MG CAPS capsule [Pharmacy Med Name: TAMSULOSIN 0.4MG  CAPSULES] 90 capsule 0    Sig: TAKE 1 CAPSULE(0.4 MG) BY MOUTH DAILY     Urology: Alpha-Adrenergic Blocker Passed - 09/23/2021  6:28 AM      Passed - Last BP in normal range    BP Readings from Last 1 Encounters:  05/25/21 130/82         Passed - Valid encounter within last 12 months    Recent Outpatient Visits          4 months ago Essential hypertension   [facility] Glean Hess, MD   5 months ago Essential hypertension   [place], [place]   8 months ago Tobacco use disorder   [facility] Glean Hess, MD   8 months ago Lumbar disc herniation with radiculopathy   [facility] Glean Hess, MD   11 months ago Annual physical exam   [facility] Glean Hess, MD      Future Appointments            In 1 week [name] [name], MD [facility], [place]   In 3 weeks Glean Hess, MD [facility], [place]           . Calcium Carb-Cholecalciferol 600-10 MG-MCG TABS [Pharmacy Med Name: CALCIUM 600+D (400U) TABLETS] 90 tablet     Sig: TAKE 1 TABLET BY MOUTH DAILY     There is no refill protocol information for this order

## 2021-09-24 ENCOUNTER — Other Ambulatory Visit: Payer: Self-pay | Admitting: Internal Medicine

## 2021-09-25 ENCOUNTER — Other Ambulatory Visit: Payer: Self-pay

## 2021-09-25 ENCOUNTER — Other Ambulatory Visit: Payer: Self-pay | Admitting: Internal Medicine

## 2021-09-25 DIAGNOSIS — Z1231 Encounter for screening mammogram for malignant neoplasm of breast: Secondary | ICD-10-CM

## 2021-09-25 MED ORDER — PANTOPRAZOLE SODIUM 40 MG PO TBEC: DELAYED_RELEASE_TABLET | ORAL | 1 refills | Status: DC

## 2021-09-25 NOTE — Progress Notes (Signed)
Sent in refill for pantrolpanol as requested

## 2021-10-01 ENCOUNTER — Ambulatory Visit: Payer: Medicare Other | Admitting: Internal Medicine

## 2021-10-16 ENCOUNTER — Other Ambulatory Visit: Payer: Self-pay

## 2021-10-16 ENCOUNTER — Encounter: Payer: Self-pay | Admitting: Internal Medicine

## 2021-10-16 ENCOUNTER — Ambulatory Visit (INDEPENDENT_AMBULATORY_CARE_PROVIDER_SITE_OTHER): Payer: Medicare Other | Admitting: Internal Medicine

## 2021-10-16 VITALS — BP 128/70 | HR 69 | Ht 61.0 in | Wt 137.0 lb

## 2021-10-16 DIAGNOSIS — Z1231 Encounter for screening mammogram for malignant neoplasm of breast: Secondary | ICD-10-CM

## 2021-10-16 DIAGNOSIS — R7303 Prediabetes: Secondary | ICD-10-CM | POA: Diagnosis not present

## 2021-10-16 DIAGNOSIS — I10 Essential (primary) hypertension: Secondary | ICD-10-CM | POA: Diagnosis not present

## 2021-10-16 DIAGNOSIS — E782 Mixed hyperlipidemia: Secondary | ICD-10-CM

## 2021-10-16 DIAGNOSIS — Z23 Encounter for immunization: Secondary | ICD-10-CM

## 2021-10-16 DIAGNOSIS — Z0000 Encounter for general adult medical examination without abnormal findings: Secondary | ICD-10-CM | POA: Diagnosis not present

## 2021-10-16 LAB — POCT URINALYSIS DIPSTICK
Bilirubin, UA: NEGATIVE
Blood, UA: NEGATIVE
Glucose, UA: NEGATIVE
Ketones, UA: NEGATIVE
Leukocytes, UA: NEGATIVE
Nitrite, UA: NEGATIVE
Protein, UA: NEGATIVE
Spec Grav, UA: 1.01 (ref 1.010–1.025)
Urobilinogen, UA: 0.2 E.U./dL
pH, UA: 6 (ref 5.0–8.0)

## 2021-10-16 MED ORDER — SHINGRIX 50 MCG/0.5ML IM SUSR: 0.5000 mL | Freq: Once | INTRAMUSCULAR | 1 refills | Status: AC

## 2021-10-16 NOTE — Progress Notes (Signed)
Date:  10/16/2021   Name:  Betty Vaughn   DOB:  11/05/1951   MRN:  [id number]   Chief Complaint: Annual Exam (Breast Exam, UA. ) Betty Vaughn is a 70 y.o. female who presents today for her Complete Annual Exam. She feels fairly well. She reports not exercising. She reports she is sleeping fairly well. Breast complaints - none.  Mammogram: 09/2020 scheduled DEXA: 09/2020 osteopenia Colonoscopy: 05/2017 repeat 5 yrs  Immunization History  Administered Date(s) Administered   Fluad Quad(high Dose 65+) 08/15/2020   H1N1 07/31/2017   Influenza, High Dose Seasonal PF 08/29/2017, 09/22/2018, 08/27/2019   Influenza-Unspecified 08/29/2017, 08/22/2021   Moderna Sars-Covid-2 Vaccination 01/21/2020, 02/18/2020, 10/04/2020   PPD Test 06/19/2015   Pneumococcal Conjugate-13 06/17/2018   Pneumococcal Polysaccharide-23 06/21/2019   Tdap 04/11/2016   Zoster, Live 06/12/2016    Hypertension This is a chronic problem. The problem is controlled. Associated symptoms include neck pain (and left arm weakness). Pertinent negatives include no chest pain, headaches, palpitations or shortness of breath. Past treatments include calcium channel blockers, beta blockers and diuretics. The current treatment provides significant improvement.  Hyperlipidemia This is a chronic problem. The problem is controlled. Pertinent negatives include no chest pain or shortness of breath. Current antihyperlipidemic treatment includes statins. The current treatment provides significant improvement of lipids.   Lab Results  Component Value Date   CREATININE 0.89 05/16/2021   BUN 16 05/16/2021   NA 140 05/16/2021   K 4.1 05/16/2021   CL 99 05/16/2021   CO2 28 05/16/2021   Lab Results  Component Value Date   CHOL 154 10/13/2020   HDL 40 10/13/2020   LDLCALC 88 10/13/2020   TRIG 146 10/13/2020   CHOLHDL 3.9 10/13/2020   Lab Results  Component Value Date   TSH 2.680 10/13/2020   Lab Results  Component  Value Date   HGBA1C 6.5 (H) 03/29/2021   Lab Results  Component Value Date   WBC 6.9 05/09/2021   HGB 10.7 (L) 05/09/2021   HCT 32.5 (L) 05/09/2021   MCV 96.4 05/09/2021   PLT 166 05/09/2021   Lab Results  Component Value Date   ALT 16 05/09/2021   AST 23 05/09/2021   ALKPHOS 58 05/09/2021   BILITOT 0.6 05/09/2021   No components found for: VITD  Review of Systems  Constitutional:  Negative for chills, fatigue and fever.  HENT:  Positive for sneezing. Negative for congestion, hearing loss, tinnitus, trouble swallowing and voice change.   Eyes:  Negative for visual disturbance.  Respiratory:  Negative for cough, chest tightness, shortness of breath and wheezing.   Cardiovascular:  Negative for chest pain, palpitations and leg swelling.  Gastrointestinal:  Negative for abdominal pain, constipation, diarrhea and vomiting.  Endocrine: Negative for polydipsia and polyuria.  Genitourinary:  Positive for frequency and urgency. Negative for dysuria, genital sores, vaginal bleeding and vaginal discharge.  Musculoskeletal:  Positive for neck pain (and left arm weakness). Negative for arthralgias, gait problem and joint swelling.  Skin:  Negative for color change and rash.  Allergic/Immunologic: Positive for environmental allergies.  Neurological:  Negative for dizziness, tremors, light-headedness and headaches.  Hematological:  Negative for adenopathy. Does not bruise/bleed easily.  Psychiatric/Behavioral:  Negative for dysphoric mood and sleep disturbance. The patient is not nervous/anxious.    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   Multiple joint complaints 05/13/2015   Depression, major, recurrent, in partial remission ([place]) 02/23/2014   Essential hypertension 02/23/2014   Mixed hyperlipidemia 02/23/2014   Chronic headache disorder 01/16/2012   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   CAD S/P percutaneous coronary angioplasty 03/16/2010    Allergies  Allergen Reactions   Azithromycin Swelling   Propoxyphene Anaphylaxis   Tramadol Anaphylaxis   Penicillins Swelling   Piroxicam Rash   Sertraline Hcl Rash    Past Surgical History:  Procedure Laterality Date   ABDOMINAL HYSTERECTOMY     total   CERVICAL FUSION  2005   CERVICAL FUSION  01/2018   C2-T2 posterior laminectomy and fusion; removal of previous hardware   CHOLECYSTECTOMY     COLONOSCOPY WITH PROPOFOL N/A 06/12/2017   Procedure: COLONOSCOPY WITH PROPOFOL;  Surgeon: Lucilla Lame, MD;  Location: [place];  Service: Gastroenterology;  Laterality: N/A;   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"   ESOPHAGOGASTRODUODENOSCOPY (EGD) WITH PROPOFOL N/A 08/22/2020   Procedure: ESOPHAGOGASTRODUODENOSCOPY (EGD) WITH PROPOFOL;  Surgeon: Lin Landsman, MD;  Location: [place];  Service: Gastroenterology;  Laterality: N/A;   ESOPHAGOGASTRODUODENOSCOPY (EGD) WITH PROPOFOL N/A 11/01/2020   Procedure: ESOPHAGOGASTRODUODENOSCOPY (EGD) WITH PROPOFOL;  Surgeon: Lin Landsman, MD;  Location: [facility] END[facility]COPY;  Service: Gastroenterology;  Laterality: N/A;   HAND SURGERY Bilateral 2001   POLYPECTOMY  2000   POLYPECTOMY N/A 06/12/2017   Procedure: POLYPECTOMY;  Surgeon: Lucilla Lame, MD;  Location: [place];  Service: Gastroenterology;  Laterality: N/A;   rigger finger      Social History   Tobacco Use   Smoking status: Every Day    Packs/day: 0.25    Years:  30.00    Pack years: 7.50    Types: Cigarettes   Smokeless tobacco: Never   Tobacco comments:    Using nicotine lozenges  Vaping Use   Vaping Use: Former  Substance Use Topics   Alcohol use: Yes    Alcohol/week: 1.0 standard drink    Types: 1 Glasses of wine per week    Comment: occasionally   Drug use: No     Medication list has been reviewed and updated.  Current Meds  Medication Sig   Accu-Chek FastClix Lancets MISC USE TO TEST BLOOD SUGAR TWICE DAILY   ACCU-CHEK GUIDE test strip USE TO TEST BLOOD SUGAR TWICE DAILY   albuterol (VENTOLIN HFA) 108 (90 Base) MCG/ACT inhaler Inhale 2 puffs into the lungs every 6 (six) hours as needed for wheezing or shortness of breath.   amLODipine (NORVASC) 5 MG tablet TAKE 1 TABLET BY MOUTH AT BEDTIME   atorvastatin (LIPITOR) 40 MG tablet TAKE 1 TABLET BY MOUTH EVERY DAY   Blood Glucose Monitoring Suppl (ACCU-CHEK GUIDE ME) w/Device KIT TO TEST TWICE DAILY   Calcium Carb-Cholecalciferol 600-10 MG-MCG TABS TAKE 1 TABLET BY MOUTH DAILY   chlorthalidone (HYGROTON) 25 MG tablet TAKE 1 TABLET BY MOUTH EVERY DAY   DULoxetine (CYMBALTA) 60 MG capsule TAKE 1 CAPSULE(60 MG) BY MOUTH DAILY   gabapentin (NEURONTIN) 300 MG capsule TAKE 2 CAPSULES(600 MG) BY MOUTH TWICE DAILY   LINZESS  145 MCG CAPS capsule TAKE 1 CAPSULE(145 MCG) BY MOUTH DAILY BEFORE AND BREAKFAST (Patient taking differently: Take 145 mcg by mouth every other day.)   loratadine (CLARITIN) 10 MG tablet TAKE 1 TABLET(10 MG) BY MOUTH DAILY   methocarbamol (ROBAXIN) 500 MG tablet TAKE 1 TABLET BY MOUTH FOUR TIMES DAILY   metoprolol tartrate (LOPRESSOR) 50 MG tablet TAKE 1 TABLET BY MOUTH TWICE DAILY   montelukast (SINGULAIR) 10 MG tablet TAKE 1 TABLET(10 MG) BY MOUTH AT BEDTIME   Multiple Vitamins-Minerals (WOMENS MULTIVITAMIN PO) Take by mouth.   nicotine polacrilex (COMMIT) 4 MG lozenge Take 1 lozenge (4 mg total) by mouth as needed for smoking cessation.   nitroGLYCERIN (NITROSTAT) 0.4 MG  SL tablet Place under the tongue.   pantoprazole (PROTONIX) 40 MG tablet TAKE 1 TABLET(40 MG) BY MOUTH TWICE DAILY BEFORE A MEAL   potassium chloride SA (KLOR-CON) 20 MEQ tablet TAKE 1 TABLET BY MOUTH EVERY DAY   tamsulosin (FLOMAX) 0.4 MG CAPS capsule TAKE 1 CAPSULE(0.4 MG) BY MOUTH DAILY   traZODone (DESYREL) 50 MG tablet TAKE 1 TABLET(50 MG) BY MOUTH AT BEDTIME   Zoster Vaccine Adjuvanted [facility]) injection Inject 0.5 mLs into the muscle once for 1 dose.    PHQ 2/9 Scores 10/16/2021 07/11/2021 05/16/2021 03/29/2021  PHQ - 2 Score 2 3 3 2   PHQ- 9 Score 17 18 17 18     GAD 7 : Generalized Anxiety Score 10/16/2021 05/16/2021 03/29/2021 01/25/2021  Nervous, Anxious, on Edge 3 2 3 1   Control/stop worrying 2 2 3 1   Worry too much - different things 2 2 2 1   Trouble relaxing 2 1 2  0  Restless 2 0 2 0  Easily annoyed or irritable 3 0 2 0  Afraid - awful might happen 2 2 1  0  Total GAD 7 Score 16 9 15 3   Anxiety Difficulty Somewhat difficult Not difficult at all Not difficult at all -    BP Readings from Last 3 Encounters:  10/16/21 128/70  05/25/21 130/82  05/16/21 122/70    Physical Exam Vitals and nursing note reviewed.  Constitutional:      General: She is not in acute distress.    Appearance: She is well-developed.  HENT:     Head: Normocephalic and atraumatic.     Right Ear: Tympanic membrane and ear canal normal.     Left Ear: Tympanic membrane and ear canal normal.     Nose:     Right Sinus: No maxillary sinus tenderness.     Left Sinus: No maxillary sinus tenderness.  Eyes:     General: No scleral icterus.       Right eye: No discharge.        Left eye: No discharge.     Conjunctiva/sclera: Conjunctivae normal.  Neck:     Thyroid: No thyromegaly.     Vascular: No carotid bruit.  Cardiovascular:     Rate and Rhythm: Normal rate and regular rhythm.     Pulses: Normal pulses.     Heart sounds: Normal heart sounds.  Pulmonary:     Effort: Pulmonary effort is normal. No  respiratory distress.     Breath sounds: No wheezing.  Chest:  Breasts:    Right: No mass, nipple discharge, skin change or tenderness.     Left: No mass, nipple discharge, skin change or tenderness.  Abdominal:     General: Bowel sounds are normal.     Palpations: Abdomen is soft.     Tenderness:  There is no abdominal tenderness.  Musculoskeletal:     Cervical back: Rigidity, spasms, torticollis and tenderness present. No erythema. Decreased range of motion.     Right lower leg: No edema.     Left lower leg: No edema.  Lymphadenopathy:     Cervical: No cervical adenopathy.  Skin:    General: Skin is warm and dry.     Findings: No rash.  Neurological:     Mental Status: She is alert and oriented to person, place, and time.     Cranial Nerves: No cranial nerve deficit.     Sensory: No sensory deficit.     Deep Tendon Reflexes: Reflexes are normal and symmetric.  Psychiatric:        Attention and Perception: Attention normal.        Mood and Affect: Mood normal.    Wt Readings from Last 3 Encounters:  10/16/21 137 lb (62.1 kg)  05/25/21 136 lb (61.7 kg)  05/16/21 136 lb (61.7 kg)    BP 128/70   Pulse 69   Ht 5' 1"  (1.549 m)   Wt 137 lb (62.1 kg)   LMP  (LMP Unknown)   SpO2 97%   BMI 25.89 kg/m   Assessment and Plan: 1. Annual physical exam Up to date on screenings and immunizations.  2. Encounter for screening mammogram for breast cancer scheduled  3. Essential hypertension Clinically stable exam with well controlled BP. Tolerating medications without side effects at this time. Pt to continue current regimen and low sodium diet; benefits of regular exercise as able discussed. - CBC with Differential/Platelet - Comprehensive metabolic panel - POCT urinalysis dipstick - TSH  4. Mixed hyperlipidemia On statin medication - Lipid panel  5. Prediabetes Continue low carb diet and weight control. - Hemoglobin A1c  6. Need for shingles vaccine First dose  today - Zoster Vaccine Adjuvanted [facility]) injection; Inject 0.5 mLs into the muscle once for 1 dose.  Dispense: 0.5 mL; Refill: 1   Partially dictated using Editor, commissioning. Any errors are unintentional.  Halina Maidens, MD [facility]  10/16/2021   Partially dictated using Dragon software. Any errors are unintentional.  Halina Maidens, MD [facility]  10/16/2021

## 2021-10-17 LAB — CBC WITH DIFFERENTIAL/PLATELET
Basophils Absolute: 0 10*3/uL (ref 0.0–0.2)
Basos: 1 %
EOS (ABSOLUTE): 0.1 10*3/uL (ref 0.0–0.4)
Eos: 2 %
Hematocrit: 41.6 % (ref 34.0–46.6)
Hemoglobin: 13.8 g/dL (ref 11.1–15.9)
Immature Grans (Abs): 0 10*3/uL (ref 0.0–0.1)
Immature Granulocytes: 0 %
Lymphocytes Absolute: 2.1 10*3/uL (ref 0.7–3.1)
Lymphs: 34 %
MCH: 31.6 pg (ref 26.6–33.0)
MCHC: 33.2 g/dL (ref 31.5–35.7)
MCV: 95 fL (ref 79–97)
Monocytes Absolute: 0.5 10*3/uL (ref 0.1–0.9)
Monocytes: 8 %
Neutrophils Absolute: 3.4 10*3/uL (ref 1.4–7.0)
Neutrophils: 55 %
Platelets: 224 10*3/uL (ref 150–450)
RBC: 4.37 x10E6/uL (ref 3.77–5.28)
RDW: 12.4 % (ref 11.7–15.4)
WBC: 6.2 10*3/uL (ref 3.4–10.8)

## 2021-10-17 LAB — COMPREHENSIVE METABOLIC PANEL
ALT: 18 IU/L (ref 0–32)
AST: 27 IU/L (ref 0–40)
Albumin/Globulin Ratio: 1.4 (ref 1.2–2.2)
Albumin: 4.3 g/dL (ref 3.8–4.8)
Alkaline Phosphatase: 102 IU/L (ref 44–121)
BUN/Creatinine Ratio: 18 (ref 12–28)
BUN: 17 mg/dL (ref 8–27)
Bilirubin Total: 0.3 mg/dL (ref 0.0–1.2)
CO2: 27 mmol/L (ref 20–29)
Calcium: 9.5 mg/dL (ref 8.7–10.3)
Chloride: 98 mmol/L (ref 96–106)
Creatinine, Ser: 0.96 mg/dL (ref 0.57–1.00)
Globulin, Total: 3 g/dL (ref 1.5–4.5)
Glucose: 104 mg/dL — ABNORMAL HIGH (ref 70–99)
Potassium: 4 mmol/L (ref 3.5–5.2)
Sodium: 139 mmol/L (ref 134–144)
Total Protein: 7.3 g/dL (ref 6.0–8.5)
eGFR: 64 mL/min/{1.73_m2} (ref >59)

## 2021-10-17 LAB — HEMOGLOBIN A1C
Est. average glucose Bld gHb Est-mCnc: 126 mg/dL
Hgb A1c MFr Bld: 6 % — ABNORMAL HIGH (ref 4.8–5.6)

## 2021-10-17 LAB — LIPID PANEL
Chol/HDL Ratio: 3.2 ratio (ref 0.0–4.4)
Cholesterol, Total: 158 mg/dL (ref 100–199)
HDL: 50 mg/dL (ref >39)
LDL Chol Calc (NIH): 90 mg/dL (ref 0–99)
Triglycerides: 99 mg/dL (ref 0–149)
VLDL Cholesterol Cal: 18 mg/dL (ref 5–40)

## 2021-10-17 LAB — TSH: TSH: 2.37 u[IU]/mL (ref 0.450–4.500)

## 2021-10-18 ENCOUNTER — Other Ambulatory Visit: Payer: Self-pay | Admitting: Internal Medicine

## 2021-10-18 NOTE — Telephone Encounter (Signed)
Requested Prescriptions  Pending Prescriptions Disp Refills  . potassium chloride SA (KLOR-CON) 20 MEQ tablet [Pharmacy Med Name: POTASSIUM CL 20MEQ ER TABLETS] 90 tablet 1    Sig: TAKE 1 TABLET BY MOUTH EVERY DAY     Endocrinology:  Minerals - Potassium Supplementation Passed - 10/18/2021  6:32 AM      Passed - K in normal range and within 360 days    Potassium  Date Value Ref Range Status  10/16/2021 4.0 3.5 - 5.2 mmol/L Final  07/06/2013 3.6 3.5 - 5.1 mmol/L Final         Passed - Cr in normal range and within 360 days    Creatinine  Date Value Ref Range Status  07/06/2013 0.91 0.60 - 1.30 mg/dL Final   Creatinine, Ser  Date Value Ref Range Status  10/16/2021 0.96 0.57 - 1.00 mg/dL Final         Passed - Valid encounter within last 12 months    Recent Outpatient Visits          2 days ago Annual physical exam   [facility] Glean Hess, MD   5 months ago Essential hypertension   [facility] Glean Hess, MD   6 months ago Essential hypertension   [facility] Glean Hess, MD   8 months ago Tobacco use disorder   [facility] Glean Hess, MD   9 months ago Lumbar disc herniation with radiculopathy   [facility] Glean Hess, MD      Future Appointments            In 5 months [name] [name], MD [facility], [place]           . atorvastatin (LIPITOR) 40 MG tablet [Pharmacy Med Name: ATORVASTATIN 40MG  TABLETS] 90 tablet 1    Sig: TAKE 1 TABLET BY MOUTH EVERY DAY     Cardiovascular:  Antilipid - Statins Passed - 10/18/2021  6:32 AM      Passed - Total Cholesterol in normal range and within 360 days    Cholesterol, Total  Date Value Ref Range Status  10/16/2021 158 100 - 199 mg/dL Final         Passed - LDL in normal range and within 360 days    LDL Chol Calc (NIH)  Date Value Ref Range Status  10/16/2021 90 0 - 99 mg/dL Final         Passed - HDL in normal range and within  360 days    HDL  Date Value Ref Range Status  10/16/2021 50 >39 mg/dL Final         Passed - Triglycerides in normal range and within 360 days    Triglycerides  Date Value Ref Range Status  10/16/2021 99 0 - 149 mg/dL Final         Passed - Patient is not pregnant      Passed - Valid encounter within last 12 months    Recent Outpatient Visits          2 days ago Annual physical exam   [facility] Glean Hess, MD   5 months ago Essential hypertension   [place], MD   6 months ago Essential hypertension   [facility] Glean Hess, MD   8 months ago Tobacco use disorder   [facility] Glean Hess, MD   9 months ago Lumbar  disc herniation with radiculopathy   [place], MD      Future Appointments            In 5 months [name] [name], MD [facility]           . metoprolol tartrate (LOPRESSOR) 50 MG tablet [Pharmacy Med Name: METOPROLOL TARTRATE 50MG  TABLETS] 180 tablet 1    Sig: TAKE 1 TABLET BY MOUTH TWICE DAILY     Cardiovascular:  Beta Blockers Passed - 10/18/2021  6:32 AM      Passed - Last BP in normal range    BP Readings from Last 1 Encounters:  10/16/21 128/70         Passed - Last Heart Rate in normal range    Pulse Readings from Last 1 Encounters:  10/16/21 69         Passed - Valid encounter within last 6 months    Recent Outpatient Visits          2 days ago Annual physical exam   [facility] Glean Hess, MD   5 months ago Essential hypertension   [facility] Glean Hess, MD   6 months ago Essential hypertension   [facility] Glean Hess, MD   8 months ago Tobacco use disorder   [facility] Glean Hess, MD   9 months ago Lumbar disc herniation with radiculopathy   [facility] Glean Hess, MD      Future Appointments            In 5 months  Glean Hess, MD [facility]           . amLODipine ([place]) 5 MG tablet [Pharmacy Med Name: AMLODIPINE BESYLATE 5MG  TABLETS] 90 tablet 1    Sig: TAKE 1 TABLET BY MOUTH AT BEDTIME     Cardiovascular:  Calcium Channel Blockers Passed - 10/18/2021  6:32 AM      Passed - Last BP in normal range    BP Readings from Last 1 Encounters:  10/16/21 128/70         Passed - Valid encounter within last 6 months    Recent Outpatient Visits          2 days ago Annual physical exam   [facility] Glean Hess, MD   5 months ago Essential hypertension   [place], Laura H, MD   6 months ago Essential hypertension   [facility] Glean Hess, MD   8 months ago Tobacco use disorder   [facility] Glean Hess, MD   9 months ago Lumbar disc herniation with radiculopathy   [facility] Glean Hess, MD      Future Appointments            In 5 months [name] [name], MD [facility], [place]           . chlorthalidone (HYGROTON) 25 MG tablet [Pharmacy Med Name: CHLORTHALIDONE 25MG  TABLETS] 90 tablet 1    Sig: TAKE 1 TABLET BY MOUTH EVERY DAY     Cardiovascular: Diuretics - Thiazide Passed - 10/18/2021  6:32 AM      Passed - Ca in normal range and within 360 days    Calcium  Date Value Ref Range Status  10/16/2021 9.5 8.7 - 10.3 mg/dL Final   Calcium, Total  Date Value Ref  Range Status  07/06/2013 9.1 8.5 - 10.1 mg/dL Final         Passed - Cr in normal range and within 360 days    Creatinine  Date Value Ref Range Status  07/06/2013 0.91 0.60 - 1.30 mg/dL Final   Creatinine, Ser  Date Value Ref Range Status  10/16/2021 0.96 0.57 - 1.00 mg/dL Final         Passed - K in normal range and within 360 days    Potassium  Date Value Ref Range Status  10/16/2021 4.0 3.5 - 5.2 mmol/L Final  07/06/2013 3.6 3.5 - 5.1 mmol/L Final         Passed - Na in normal range and within  360 days    Sodium  Date Value Ref Range Status  10/16/2021 139 134 - 144 mmol/L Final  07/06/2013 137 136 - 145 mmol/L Final         Passed - Last BP in normal range    BP Readings from Last 1 Encounters:  10/16/21 128/70         Passed - Valid encounter within last 6 months    Recent Outpatient Visits          2 days ago Annual physical exam   [facility] Glean Hess, MD   5 months ago Essential hypertension   [facility] Glean Hess, MD   6 months ago Essential hypertension   [facility] Glean Hess, MD   8 months ago Tobacco use disorder   [facility] Glean Hess, MD   9 months ago Lumbar disc herniation with radiculopathy   [facility] Glean Hess, MD      Future Appointments            In 5 months [name] [name], MD [facility], [place] [facility]

## 2021-10-23 ENCOUNTER — Other Ambulatory Visit: Payer: Self-pay | Admitting: Internal Medicine

## 2021-10-23 ENCOUNTER — Other Ambulatory Visit: Payer: Self-pay

## 2021-10-23 ENCOUNTER — Ambulatory Visit
Admission: RE | Admit: 2021-10-23 | Discharge: 2021-10-23 | Disposition: A | Discharge status: Home / Self Care | Payer: Medicare Other | Source: Ambulatory Visit | Attending: Internal Medicine | Admitting: Internal Medicine

## 2021-10-23 DIAGNOSIS — J3089 Other allergic rhinitis: Secondary | ICD-10-CM

## 2021-10-23 DIAGNOSIS — Z1231 Encounter for screening mammogram for malignant neoplasm of breast: Secondary | ICD-10-CM | POA: Diagnosis not present

## 2021-10-24 NOTE — Telephone Encounter (Signed)
Requested Prescriptions  Pending Prescriptions Disp Refills  . loratadine (CLARITIN) 10 MG tablet [Pharmacy Med Name: LORATADINE 10MG  TABLETS] 90 tablet 0    Sig: TAKE 1 TABLET(10 MG) BY MOUTH DAILY     Ear, Nose, and Throat:  Antihistamines Passed - 10/23/2021  6:32 AM      Passed - Valid encounter within last 12 months    Recent Outpatient Visits          1 week ago Annual physical exam   [facility] Glean Hess, MD   5 months ago Essential hypertension   [facility] Glean Hess, MD   6 months ago Essential hypertension   [facility] Glean Hess, MD   9 months ago Tobacco use disorder   [facility] Glean Hess, MD   9 months ago Lumbar disc herniation with radiculopathy   [facility] Glean Hess, MD      Future Appointments            In 5 months [name] [name], MD [facility], [place]

## 2021-11-07 ENCOUNTER — Other Ambulatory Visit: Payer: Self-pay | Admitting: Internal Medicine

## 2021-11-07 DIAGNOSIS — R7309 Other abnormal glucose: Secondary | ICD-10-CM

## 2021-11-07 NOTE — Telephone Encounter (Signed)
Requested Prescriptions  Pending Prescriptions Disp Refills   ACCU-CHEK GUIDE test strip [Pharmacy Med Name: ACCU-CHEK GUIDE TEST STRIPS 100S] 100 strip 5    Sig: NEED NEW RX     Endocrinology: Diabetes - Testing Supplies Passed - 11/07/2021 11:32 AM      Passed - Valid encounter within last 12 months    Recent Outpatient Visits          3 weeks ago Annual physical exam   [facility] Glean Hess, MD   5 months ago Essential hypertension   [facility] Glean Hess, MD   7 months ago Essential hypertension   [facility] Glean Hess, MD   9 months ago Tobacco use disorder   [facility] Glean Hess, MD   9 months ago Lumbar disc herniation with radiculopathy   [facility] Glean Hess, MD      Future Appointments            In 5 months [name] [name], MD [facility], [place] lancets [place] Med Name: SOFTCLIX LANCETS] 100 each     Sig: [place]     Endocrinology: Diabetes - Testing Supplies Passed - 11/07/2021 11:32 AM      Passed - Valid encounter within last 12 months    Recent Outpatient Visits          3 weeks ago Annual physical exam   [facility] Glean Hess, MD   5 months ago Essential hypertension   [facility] Glean Hess, MD   7 months ago Essential hypertension   [facility] Glean Hess, MD   9 months ago Tobacco use disorder   [facility] Glean Hess, MD   9 months ago Lumbar disc herniation with radiculopathy   [facility] Glean Hess, MD      Future Appointments            In 5 months [name] [name], MD [facility], [facility]

## 2021-11-08 ENCOUNTER — Other Ambulatory Visit: Payer: Self-pay

## 2021-11-08 ENCOUNTER — Telehealth: Payer: Self-pay | Admitting: Internal Medicine

## 2021-11-08 DIAGNOSIS — R7309 Other abnormal glucose: Secondary | ICD-10-CM

## 2021-11-08 MED ORDER — ACCU-CHEK GUIDE VI STRP: ORAL_STRIP | 5 refills | Status: DC

## 2021-11-08 MED ORDER — ACCU-CHEK SOFTCLIX LANCETS MISC: 5 refills | Status: DC

## 2021-11-08 NOTE — Telephone Encounter (Signed)
Sent in with correct instructions.  KP

## 2021-11-08 NOTE — Telephone Encounter (Signed)
Wells Guiles at [facility] called in needs directions for Accu-Chek Softclix Lancets lancets and ACCU-CHEK GUIDE test strip

## 2021-11-17 ENCOUNTER — Other Ambulatory Visit: Payer: Self-pay | Admitting: Internal Medicine

## 2021-11-17 DIAGNOSIS — F5104 Psychophysiologic insomnia: Secondary | ICD-10-CM

## 2021-11-17 NOTE — Telephone Encounter (Signed)
Requested Prescriptions  Pending Prescriptions Disp Refills   traZODone (DESYREL) 50 MG tablet [Pharmacy Med Name: TRAZODONE 50MG  TABLETS] 30 tablet 5    Sig: TAKE 1 TABLET(50 MG) BY MOUTH AT BEDTIME     Psychiatry: Antidepressants - Serotonin Modulator Passed - 11/17/2021  6:30 AM      Passed - Completed PHQ-2 or PHQ-9 in the last 360 days      Passed - Valid encounter within last 6 months    Recent Outpatient Visits          1 month ago Annual physical exam   [facility] Glean Hess, MD   6 months ago Essential hypertension   [place], MD   7 months ago Essential hypertension   [facility] Glean Hess, MD   9 months ago Tobacco use disorder   [facility] Glean Hess, MD   10 months ago Lumbar disc herniation with radiculopathy   [facility] Glean Hess, MD      Future Appointments            In 4 months [name] [name], MD [facility], [facility]

## 2021-11-21 ENCOUNTER — Other Ambulatory Visit: Payer: Self-pay

## 2021-11-21 ENCOUNTER — Telehealth: Payer: Self-pay | Admitting: Internal Medicine

## 2021-11-21 DIAGNOSIS — R7309 Other abnormal glucose: Secondary | ICD-10-CM

## 2021-11-21 MED ORDER — ACCU-CHEK GUIDE VI STRP: ORAL_STRIP | 5 refills | Status: DC

## 2021-11-21 NOTE — Telephone Encounter (Signed)
Copied from [place] [phone]. Topic: General - Other >> Nov 21, 2021 11:37 AM Valere Dross wrote: Reason for CRM: Pt called in stating she needed a nurse to give her a call about her medication, and the Pharmacy contacted her about the medication directions sent over to the Pharmacy, a new script with the direction, please advise.

## 2021-11-21 NOTE — Telephone Encounter (Signed)
Sent in test strips with directions to test blood sugar twice daily. Diagnosis code attached to Rx.

## 2021-12-17 ENCOUNTER — Other Ambulatory Visit: Payer: Self-pay | Admitting: Internal Medicine

## 2021-12-17 DIAGNOSIS — G629 Polyneuropathy, unspecified: Secondary | ICD-10-CM

## 2021-12-17 NOTE — Telephone Encounter (Signed)
Requested medication (s) are due for refill today:   Yes  Requested medication (s) are on the active medication list:   Yes  Future visit scheduled:   Yes   Last ordered: Cymbalta 09/18/2021 #90, 0 refills;    gabapentin #360, 0 refills;   Robaxin 06/20/2021 #120, 5 refills - non delegated refill.  Returned because non delegated refill.   Requested Prescriptions  Pending Prescriptions Disp Refills   DULoxetine (CYMBALTA) 60 MG capsule [Pharmacy Med Name: DULOXETINE DR 60MG  CAPSULES] 90 capsule 0    Sig: TAKE 1 CAPSULE(60 MG) BY MOUTH DAILY     Psychiatry: Antidepressants - SNRI Passed - 12/17/2021  6:29 AM      Passed - Completed PHQ-2 or PHQ-9 in the last 360 days      Passed - Last BP in normal range    BP Readings from Last 1 Encounters:  10/16/21 128/70          Passed - Valid encounter within last 6 months    Recent Outpatient Visits           2 months ago Annual physical exam   [facility] Glean Hess, MD   7 months ago Essential hypertension   [facility] Glean Hess, MD   8 months ago Essential hypertension   [facility] Glean Hess, MD   10 months ago Tobacco use disorder   [facility] Glean Hess, MD   11 months ago Lumbar disc herniation with radiculopathy   [facility] Glean Hess, MD       Future Appointments             In 3 months [name] [name], MD [facility]             gabapentin (NEURONTIN) 300 MG capsule [Pharmacy Med Name: GABAPENTIN 300MG  CAPSULES] 360 capsule 0    Sig: TAKE 2 CAPSULES(600 MG) BY MOUTH TWICE DAILY     Neurology: Anticonvulsants - gabapentin Passed - 12/17/2021  6:29 AM      Passed - Valid encounter within last 12 months    Recent Outpatient Visits           2 months ago Annual physical exam   [facility] Glean Hess, MD   7 months ago Essential hypertension   [facility] Glean Hess, MD    8 months ago Essential hypertension   [facility] Glean Hess, MD   10 months ago Tobacco use disorder   [facility] Glean Hess, MD   11 months ago Lumbar disc herniation with radiculopathy   [facility] Glean Hess, MD       Future Appointments             In 3 months [name] [name], MD [facility]             methocarbamol (ROBAXIN) 500 MG tablet [Pharmacy Med Name: METHOCARBAMOL 500MG  TABLETS] 120 tablet 5    Sig: TAKE 1 TABLET BY MOUTH FOUR TIMES DAILY     Not Delegated - Analgesics:  Muscle Relaxants Failed - 12/17/2021  6:29 AM      Failed - This refill cannot be delegated      Passed - Valid encounter within last 6 months    Recent Outpatient Visits           2 months ago Annual physical exam  [facility] Glean Hess, MD   7 months ago Essential hypertension   [place], [place]   8 months ago Essential hypertension   [place], MD   10 months ago Tobacco use disorder   [facility] Glean Hess, MD   11 months ago Lumbar disc herniation with radiculopathy   [facility] Glean Hess, MD       Future Appointments             In 3 months [name] [name], MD [facility], [facility]

## 2021-12-22 ENCOUNTER — Other Ambulatory Visit: Payer: Self-pay | Admitting: Internal Medicine

## 2021-12-22 DIAGNOSIS — R3911 Hesitancy of micturition: Secondary | ICD-10-CM

## 2021-12-22 NOTE — Telephone Encounter (Signed)
Requested Prescriptions  Pending Prescriptions Disp Refills   tamsulosin (FLOMAX) 0.4 MG CAPS capsule [Pharmacy Med Name: TAMSULOSIN 0.4MG  CAPSULES] 90 capsule 0    Sig: TAKE 1 CAPSULE(0.4 MG) BY MOUTH DAILY     Urology: Alpha-Adrenergic Blocker Passed - 12/22/2021  6:30 AM      Passed - Last BP in normal range    BP Readings from Last 1 Encounters:  10/16/21 128/70         Passed - Valid encounter within last 12 months    Recent Outpatient Visits          2 months ago Annual physical exam   [facility] Glean Hess, MD   7 months ago Essential hypertension   [facility] Glean Hess, MD   8 months ago Essential hypertension   [facility] Glean Hess, MD   11 months ago Tobacco use disorder   [facility] Glean Hess, MD   11 months ago Lumbar disc herniation with radiculopathy   [facility] Glean Hess, MD      Future Appointments            In 3 months [name] [name], MD [facility]            Calcium Carb-Cholecalciferol 600-10 MG-MCG TABS [Pharmacy Med Name: CALCIUM 600+D (400U) TABLETS] 90 tablet 0    Sig: TAKE 1 TABLET BY MOUTH DAILY     There is no refill protocol information for this order

## 2021-12-27 ENCOUNTER — Telehealth: Payer: Self-pay

## 2021-12-27 ENCOUNTER — Other Ambulatory Visit: Payer: Self-pay

## 2021-12-27 ENCOUNTER — Ambulatory Visit: Payer: Self-pay | Admitting: *Deleted

## 2021-12-27 DIAGNOSIS — Z86010 Personal history of colonic polyps: Secondary | ICD-10-CM

## 2021-12-27 MED ORDER — NA SULFATE-K SULFATE-MG SULF 17.5-3.13-1.6 GM/177ML PO SOLN: 1.0000 | Freq: Once | ORAL | 0 refills | Status: AC
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
## 2021-12-27 NOTE — Telephone Encounter (Signed)
Pt received her 5 year recall letter for her colonoscopy. Pt contact pt to schedule.

## 2021-12-27 NOTE — Telephone Encounter (Signed)
Called pt let her know that she needed to be seen sooner than Monday 12/31/21. Told pt that she needed to go to UC or the ER. Pt verbalized understanding.  KP

## 2021-12-27 NOTE — Telephone Encounter (Signed)
Procedure has been scheduled for 01/08/22 w/[facility].

## 2021-12-27 NOTE — Progress Notes (Signed)
Gastroenterology Pre-Procedure Review  Request Date: 01/08/2022 Requesting Physician: Dr. Marius Ditch  PATIENT REVIEW QUESTIONS: The patient responded to the following health history questions as indicated:  5 year recall  1. Are you having any GI issues?  Pt states her bottom becomes sore when sitting to long. 2. Do you have a personal history of Polyps? yes (05/2017 polyps removed.) 3. Do you have a family history of Colon Cancer or Polyps? yes (daughter- polyps) 4. Diabetes Mellitus? no 5. Joint replacements in the past 12 months?no 6. Major health problems in the past 3 months?no 7. Any artificial heart valves, MVP, or defibrillator?no    MEDICATIONS & ALLERGIES:    Patient reports the following regarding taking any anticoagulation/antiplatelet therapy:   Plavix, Coumadin, Eliquis, Xarelto, Lovenox, Pradaxa, Brilinta, or Effient? no Aspirin? no  Patient confirms/reports the following medications:  Current Outpatient Medications  Medication Sig Dispense Refill   Accu-Chek Softclix Lancets lancets 2 times a day 100 each 5   albuterol (VENTOLIN HFA) 108 (90 Base) MCG/ACT inhaler Inhale 2 puffs into the lungs every 6 (six) hours as needed for wheezing or shortness of breath. 18 g 1   amLODipine (NORVASC) 5 MG tablet TAKE 1 TABLET BY MOUTH AT BEDTIME 90 tablet 1   atorvastatin (LIPITOR) 40 MG tablet TAKE 1 TABLET BY MOUTH EVERY DAY 90 tablet 1   Blood Glucose Monitoring Suppl (ACCU-CHEK GUIDE ME) w/Device KIT TO TEST TWICE DAILY 1 kit 0   Calcium Carb-Cholecalciferol 600-10 MG-MCG TABS TAKE 1 TABLET BY MOUTH DAILY 90 tablet 0   chlorthalidone (HYGROTON) 25 MG tablet TAKE 1 TABLET BY MOUTH EVERY DAY 90 tablet 1   DULoxetine (CYMBALTA) 60 MG capsule TAKE 1 CAPSULE(60 MG) BY MOUTH DAILY 90 capsule 1   gabapentin (NEURONTIN) 300 MG capsule TAKE 2 CAPSULES(600 MG) BY MOUTH TWICE DAILY 360 capsule 1   glucose blood (ACCU-CHEK GUIDE) test strip Test blood sugar twice daily 100 strip 5   LINZESS  145 MCG CAPS capsule TAKE 1 CAPSULE(145 MCG) BY MOUTH DAILY BEFORE AND BREAKFAST (Patient taking differently: Take 145 mcg by mouth every other day.) 30 capsule 4   loratadine (CLARITIN) 10 MG tablet TAKE 1 TABLET(10 MG) BY MOUTH DAILY 90 tablet 0   methocarbamol (ROBAXIN) 500 MG tablet TAKE 1 TABLET BY MOUTH FOUR TIMES DAILY 120 tablet 5   metoprolol tartrate (LOPRESSOR) 50 MG tablet TAKE 1 TABLET BY MOUTH TWICE DAILY 180 tablet 1   montelukast (SINGULAIR) 10 MG tablet TAKE 1 TABLET(10 MG) BY MOUTH AT BEDTIME 90 tablet 1   Multiple Vitamins-Minerals (WOMENS MULTIVITAMIN PO) Take by mouth.     nicotine polacrilex (COMMIT) 4 MG lozenge Take 1 lozenge (4 mg total) by mouth as needed for smoking cessation. 27 lozenge 2   nitroGLYCERIN (NITROSTAT) 0.4 MG SL tablet Place under the tongue.     pantoprazole (PROTONIX) 40 MG tablet TAKE 1 TABLET(40 MG) BY MOUTH TWICE DAILY BEFORE A MEAL 180 tablet 1   potassium chloride SA (KLOR-CON) 20 MEQ tablet TAKE 1 TABLET BY MOUTH EVERY DAY 90 tablet 1   tamsulosin (FLOMAX) 0.4 MG CAPS capsule TAKE 1 CAPSULE(0.4 MG) BY MOUTH DAILY 90 capsule 0   traZODone (DESYREL) 50 MG tablet TAKE 1 TABLET(50 MG) BY MOUTH AT BEDTIME 30 tablet 5   No current facility-administered medications for this visit.    Patient confirms/reports the following allergies:  Allergies  Allergen Reactions   Azithromycin Swelling   Propoxyphene Anaphylaxis   Tramadol Anaphylaxis   Penicillins Swelling  Piroxicam Rash   Sertraline Hcl Rash    No orders of the defined types were placed in this encounter.   AUTHORIZATION INFORMATION Primary Insurance: 1D#: Group #:  Secondary Insurance: 1D#: Group #:  SCHEDULE INFORMATION: Date: 01/08/2022 Time: Location: [place]

## 2021-12-28 DIAGNOSIS — Z9181 History of falling: Secondary | ICD-10-CM | POA: Diagnosis not present

## 2021-12-28 DIAGNOSIS — S46919A Strain of unspecified muscle, fascia and tendon at shoulder and upper arm level, unspecified arm, initial encounter: Secondary | ICD-10-CM | POA: Diagnosis not present

## 2021-12-31 ENCOUNTER — Ambulatory Visit: Payer: Medicare Other | Admitting: Internal Medicine

## 2022-01-01 ENCOUNTER — Encounter: Payer: Self-pay | Admitting: Gastroenterology

## 2022-01-08 ENCOUNTER — Encounter: Payer: Self-pay | Admitting: Gastroenterology

## 2022-01-08 ENCOUNTER — Encounter
Admission: RE | Disposition: A | Discharge status: Home / Self Care | Payer: Self-pay | Source: Home / Self Care | Attending: Gastroenterology

## 2022-01-08 ENCOUNTER — Ambulatory Visit: Payer: Medicare Other | Admitting: Anesthesiology

## 2022-01-08 ENCOUNTER — Other Ambulatory Visit: Payer: Self-pay

## 2022-01-08 ENCOUNTER — Ambulatory Visit
Admission: RE | Admit: 2022-01-08 | Discharge: 2022-01-08 | Disposition: A | Discharge status: Home / Self Care | Payer: Medicare Other | Attending: Gastroenterology | Admitting: Gastroenterology

## 2022-01-08 DIAGNOSIS — D122 Benign neoplasm of ascending colon: Secondary | ICD-10-CM | POA: Diagnosis not present

## 2022-01-08 DIAGNOSIS — Z1211 Encounter for screening for malignant neoplasm of colon: Secondary | ICD-10-CM | POA: Diagnosis not present

## 2022-01-08 DIAGNOSIS — I10 Essential (primary) hypertension: Secondary | ICD-10-CM | POA: Insufficient documentation

## 2022-01-08 DIAGNOSIS — K317 Polyp of stomach and duodenum: Secondary | ICD-10-CM | POA: Diagnosis not present

## 2022-01-08 DIAGNOSIS — I2510 Atherosclerotic heart disease of native coronary artery without angina pectoris: Secondary | ICD-10-CM | POA: Insufficient documentation

## 2022-01-08 DIAGNOSIS — K219 Gastro-esophageal reflux disease without esophagitis: Secondary | ICD-10-CM | POA: Insufficient documentation

## 2022-01-08 DIAGNOSIS — Z8711 Personal history of peptic ulcer disease: Secondary | ICD-10-CM | POA: Diagnosis not present

## 2022-01-08 DIAGNOSIS — F32A Depression, unspecified: Secondary | ICD-10-CM | POA: Insufficient documentation

## 2022-01-08 DIAGNOSIS — Z86010 Personal history of colonic polyps: Secondary | ICD-10-CM | POA: Diagnosis not present

## 2022-01-08 DIAGNOSIS — D128 Benign neoplasm of rectum: Secondary | ICD-10-CM | POA: Insufficient documentation

## 2022-01-08 DIAGNOSIS — N289 Disorder of kidney and ureter, unspecified: Secondary | ICD-10-CM | POA: Diagnosis not present

## 2022-01-08 DIAGNOSIS — M1990 Unspecified osteoarthritis, unspecified site: Secondary | ICD-10-CM | POA: Diagnosis not present

## 2022-01-08 DIAGNOSIS — K621 Rectal polyp: Secondary | ICD-10-CM

## 2022-01-08 DIAGNOSIS — K635 Polyp of colon: Secondary | ICD-10-CM | POA: Diagnosis not present

## 2022-01-08 DIAGNOSIS — Q402 Other specified congenital malformations of stomach: Secondary | ICD-10-CM | POA: Diagnosis not present

## 2022-01-08 DIAGNOSIS — K5730 Diverticulosis of large intestine without perforation or abscess without bleeding: Secondary | ICD-10-CM | POA: Insufficient documentation

## 2022-01-08 DIAGNOSIS — F17210 Nicotine dependence, cigarettes, uncomplicated: Secondary | ICD-10-CM | POA: Diagnosis not present

## 2022-01-08 DIAGNOSIS — E119 Type 2 diabetes mellitus without complications: Secondary | ICD-10-CM | POA: Diagnosis not present

## 2022-01-08 HISTORY — PX: COLONOSCOPY WITH PROPOFOL: SHX5780

## 2022-01-08 HISTORY — PX: ESOPHAGOGASTRODUODENOSCOPY (EGD) WITH PROPOFOL: SHX5813

## 2022-01-08 HISTORY — PX: POLYPECTOMY: SHX5525

## 2022-01-08 HISTORY — DX: Other complications of anesthesia, initial encounter: T88.59XA

## 2022-01-08 LAB — GLUCOSE, CAPILLARY: Glucose-Capillary: 75 mg/dL (ref 70–99)

## 2022-01-08 SURGERY — COLONOSCOPY WITH PROPOFOL
Anesthesia: General | Site: Rectum

## 2022-01-08 MED ORDER — LACTATED RINGERS IV SOLN: INTRAVENOUS | Status: DC

## 2022-01-08 MED ORDER — PROPOFOL 10 MG/ML IV BOLUS
INTRAVENOUS | Status: DC | PRN
Start: 2022-01-08 — End: 2022-01-08
  Administered 2022-01-08: 30 mg via INTRAVENOUS
  Administered 2022-01-08: 50 mg via INTRAVENOUS

## 2022-01-08 MED ORDER — GLYCOPYRROLATE 0.2 MG/ML IJ SOLN
INTRAMUSCULAR | Status: DC | PRN
  Administered 2022-01-08: .1 mg via INTRAVENOUS

## 2022-01-08 MED ORDER — LIDOCAINE HCL (CARDIAC) PF 100 MG/5ML IV SOSY
PREFILLED_SYRINGE | INTRAVENOUS | Status: DC | PRN
  Administered 2022-01-08: 50 mg via INTRAVENOUS

## 2022-01-08 MED ORDER — SODIUM CHLORIDE 0.9 % IV SOLN: INTRAVENOUS | Status: DC

## 2022-01-08 MED ORDER — PROPOFOL 500 MG/50ML IV EMUL
INTRAVENOUS | Status: DC | PRN
  Administered 2022-01-08: 140 ug/kg/min via INTRAVENOUS

## 2022-01-08 SURGICAL SUPPLY — 13 items
CLIP HMST 235XBRD CATH ROT (MISCELLANEOUS) IMPLANT
CLIP RESOLUTION 360 11X235 (MISCELLANEOUS) ×3
FORCEPS BIOP RAD 4 LRG CAP 4 (CUTTING FORCEPS) ×1 IMPLANT
GOWN CVR UNV OPN BCK APRN NK (MISCELLANEOUS) ×4 IMPLANT
GOWN ISOL THUMB LOOP REG UNIV (MISCELLANEOUS) ×6
INJECTOR VARIJECT VIN23 (MISCELLANEOUS) IMPLANT
KIT PRC NS LF DISP ENDO (KITS) ×2 IMPLANT
KIT PROCEDURE OLYMPUS (KITS) ×3
MANIFOLD NEPTUNE II (INSTRUMENTS) ×3 IMPLANT
SNARE LASSO HEX 3 IN 1 (INSTRUMENTS) ×1 IMPLANT
TRAP ETRAP POLY (MISCELLANEOUS) ×1 IMPLANT
VARIJECT INJECTOR VIN23 (MISCELLANEOUS) ×3
WATER STERILE IRR 250ML POUR (IV SOLUTION) ×3 IMPLANT

## 2022-01-08 NOTE — Op Note (Signed)
[facility] Gastroenterology Patient Name: Betty Vaughn Procedure Date: 01/08/2022 9:38 AM MRN: [id number] Account #: [id number] Date of Birth: 1951-09-29 Admit Type: Outpatient Age: 71 Room: [facility] OR ROOM 1 Gender: Female Note Status: Finalized Instrument Name: [id number] Procedure:             Upper GI endoscopy Indications:           Follow-up of chronic gastric ulcer Providers:             Lin Landsman MD, MD Referring MD:          Halina Maidens, MD (Referring MD) Medicines:             General Anesthesia Complications:         No immediate complications. Estimated blood loss: None. Procedure:             Pre-Anesthesia Assessment:                        - Prior to the procedure, a History and Physical was                         performed, and patient medications and allergies were                         reviewed. The patient is competent. The risks and                         benefits of the procedure and the sedation options and                         risks were discussed with the patient. All questions                         were answered and informed consent was obtained.                         Patient identification and proposed procedure were                         verified by the physician, the nurse, the                         anesthesiologist, the anesthetist and the technician                         in the pre-procedure area in the procedure room in the                         endoscopy suite. Mental Status Examination: alert and                         oriented. Airway Examination: normal oropharyngeal                         airway and neck mobility. Respiratory Examination:                         clear to auscultation. CV Examination: normal.  Prophylactic Antibiotics: The patient does not require                         prophylactic antibiotics. Prior Anticoagulants: The                         patient has  taken no previous anticoagulant or                         antiplatelet agents. ASA Grade Assessment: III - A                         patient with severe systemic disease. After reviewing                         the risks and benefits, the patient was deemed in                         satisfactory condition to undergo the procedure. The                         anesthesia plan was to use general anesthesia.                         Immediately prior to administration of medications,                         the patient was re-assessed for adequacy to receive                         sedatives. The heart rate, respiratory rate, oxygen                         saturations, blood pressure, adequacy of pulmonary                         ventilation, and response to care were monitored                         throughout the procedure. The physical status of the                         patient was re-assessed after the procedure.                        After obtaining informed consent, the endoscope was                         passed under direct vision. Throughout the procedure,                         the patient's blood pressure, pulse, and oxygen                         saturations were monitored continuously. The Endoscope                         was introduced through the mouth, and advanced to the  second part of duodenum. The upper GI endoscopy was                         accomplished without difficulty. The patient tolerated                         the procedure well. Findings:      A single medium-sized sessile polypoid lesion with no bleeding was found       in the duodenal bulb. Biopsies were taken with a cold forceps for       histology. Estimated blood loss was minimal.      The second portion of the duodenum was normal.      The entire examined stomach was normal.      The cardia and gastric fundus were normal on retroflexion.      The gastroesophageal junction  and examined esophagus were normal. Impression:            - A single duodenal polypoid lesion. Biopsied.                        - Normal second portion of the duodenum.                        - Normal stomach.                        - Normal gastroesophageal junction and esophagus. Recommendation:        - Await pathology results.                        - Proceed with colonoscopy as scheduled                        See colonoscopy report Procedure Code(s):     --- Professional ---                        [phone], Esophagogastroduodenoscopy, flexible,                         transoral; with biopsy, single or multiple Diagnosis Code(s):     --- Professional ---                        K31.7, Polyp of stomach and duodenum                        K25.7, Chronic gastric ulcer without hemorrhage or                         perforation CPT copyright 2019 [employer]. All rights reserved. The codes documented in this report are preliminary and upon coder review may  be revised to meet current compliance requirements. Dr. Ulyess Mort Lin Landsman MD, MD 01/08/2022 10:01:22 AM This report has been signed electronically. Number of Addenda: 0 Note Initiated On: 01/08/2022 9:38 AM Total Procedure Duration: 0 hours 6 minutes 15 seconds  Estimated Blood Loss:  Estimated blood loss: none.      [facility]

## 2022-01-08 NOTE — H&P (Addendum)
Betty Darby, MD [address]  [place]  [place], [place] [address]  Main: [phone]  Fax: [phone] Pager: [phone]  Primary Care Physician:  Glean Hess, MD Primary Gastroenterologist:  Dr. Cephas Vaughn  Pre-Procedure History & Physical: HPI:  Betty Vaughn is a 71 y.o. female is here for an EGD and a colonoscopy.   Past Medical History:  Diagnosis Date   Allergy    Arthritis    Chronic insomnia    Colon cancer (HCC)    polyp was cancerous   Complication of anesthesia    slow to wake   Depression    Diabetes mellitus without complication (HCC)    diet controlled   GERD (gastroesophageal reflux disease)    Hx of colonic polyps    Hyperlipidemia    Hypertension    Neuromuscular disorder (HCC)    Perforated gastric ulcer ([place]) 08/01/2020   Psoriasis    scalp    Past Surgical History:  Procedure Laterality Date   ABDOMINAL HYSTERECTOMY     total   CERVICAL FUSION  2005   CERVICAL FUSION  01/2018   C2-T2 posterior laminectomy and fusion; removal of previous hardware   CHOLECYSTECTOMY     COLONOSCOPY WITH PROPOFOL N/A 06/12/2017   Procedure: COLONOSCOPY WITH PROPOFOL;  Surgeon: Lucilla Lame, MD;  Location: [facility];  Service: Gastroenterology;  Laterality: N/A;   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"   ESOPHAGOGASTRODUODENOSCOPY (EGD) WITH PROPOFOL N/A 08/22/2020   Procedure: ESOPHAGOGASTRODUODENOSCOPY (EGD) WITH PROPOFOL;  Surgeon: Lin Landsman, MD;  Location: [facility];  Service: Gastroenterology;  Laterality: N/A;   ESOPHAGOGASTRODUODENOSCOPY (EGD) WITH PROPOFOL N/A 11/01/2020   Procedure: ESOPHAGOGASTRODUODENOSCOPY (EGD) WITH PROPOFOL;  Surgeon: Lin Landsman, MD;  Location: [facility] ENDOSCOPY;  Service: Gastroenterology;  Laterality: N/A;   HAND SURGERY Bilateral 2001   POLYPECTOMY  2000   POLYPECTOMY N/A 06/12/2017   Procedure: POLYPECTOMY;  Surgeon: Lucilla Lame, MD;  Location: [facility];   Service: Gastroenterology;  Laterality: N/A;   rigger finger      Prior to Admission medications   Medication Sig Start Date End Date Taking? Authorizing Provider  albuterol (VENTOLIN HFA) 108 (90 Base) MCG/ACT inhaler Inhale 2 puffs into the lungs every 6 (six) hours as needed for wheezing or shortness of breath. 03/29/21  Yes Glean Hess, MD  amLODipine (NORVASC) 5 MG tablet TAKE 1 TABLET BY MOUTH AT BEDTIME 10/18/21  Yes Glean Hess, MD  atorvastatin (LIPITOR) 40 MG tablet TAKE 1 TABLET BY MOUTH EVERY DAY 10/18/21  Yes Glean Hess, MD  Calcium Carb-Cholecalciferol 600-10 MG-MCG TABS TAKE 1 TABLET BY MOUTH DAILY 12/22/21  Yes Glean Hess, MD  chlorthalidone (HYGROTON) 25 MG tablet TAKE 1 TABLET BY MOUTH EVERY DAY 10/18/21  Yes Glean Hess, MD  DULoxetine (CYMBALTA) 60 MG capsule TAKE 1 CAPSULE(60 MG) BY MOUTH DAILY 12/17/21  Yes Glean Hess, MD  gabapentin (NEURONTIN) 300 MG capsule TAKE 2 CAPSULES(600 MG) BY MOUTH TWICE DAILY 12/17/21  Yes Glean Hess, MD  LINZESS 145 MCG CAPS capsule TAKE 1 CAPSULE(145 MCG) BY MOUTH DAILY BEFORE AND BREAKFAST Patient taking differently: Take 145 mcg by mouth every other day. 03/27/21  Yes Diondra Pines, Tally Due, MD  loratadine (CLARITIN) 10 MG tablet TAKE 1 TABLET(10 MG) BY MOUTH DAILY 10/24/21  Yes Glean Hess, MD  methocarbamol (ROBAXIN) 500 MG tablet TAKE 1 TABLET BY MOUTH FOUR TIMES DAILY 12/17/21  Yes Glean Hess, MD  metoprolol tartrate (LOPRESSOR) 50 MG tablet TAKE 1 TABLET BY MOUTH TWICE DAILY 10/18/21  Yes Glean Hess, MD  montelukast (SINGULAIR) 10 MG tablet TAKE 1 TABLET(10 MG) BY MOUTH AT BEDTIME 09/18/21  Yes Glean Hess, MD  Multiple Vitamins-Minerals (WOMENS MULTIVITAMIN PO) Take by mouth.   Yes [provider]  pantoprazole (PROTONIX) 40 MG tablet TAKE 1 TABLET(40 MG) BY MOUTH TWICE DAILY BEFORE A MEAL 09/25/21  Yes Rasean Joos, Tally Due, MD  potassium chloride SA (KLOR-CON) 20 MEQ  tablet TAKE 1 TABLET BY MOUTH EVERY DAY 10/18/21  Yes Glean Hess, MD  tamsulosin (FLOMAX) 0.4 MG CAPS capsule TAKE 1 CAPSULE(0.4 MG) BY MOUTH DAILY 12/22/21  Yes Glean Hess, MD  traZODone (DESYREL) 50 MG tablet TAKE 1 TABLET(50 MG) BY MOUTH AT BEDTIME 11/17/21  Yes Glean Hess, MD  Accu-Chek Softclix Lancets lancets 2 times a day 11/08/21   Glean Hess, MD  Blood Glucose Monitoring Suppl (ACCU-CHEK GUIDE ME) w/Device KIT TO TEST TWICE DAILY 06/19/19   Glean Hess, MD  glucose blood (ACCU-CHEK GUIDE) test strip Test blood sugar twice daily 11/21/21   Glean Hess, MD  nicotine polacrilex (COMMIT) 4 MG lozenge Take 1 lozenge (4 mg total) by mouth as needed for smoking cessation. 01/25/21   Glean Hess, MD  nitroGLYCERIN (NITROSTAT) 0.4 MG SL tablet Place under the tongue.    [provider]    Allergies as of 12/27/2021 - Review Complete 10/16/2021  Allergen Reaction Noted   Azithromycin Swelling 11/10/2016   Propoxyphene Anaphylaxis 05/13/2015   Tramadol Anaphylaxis 05/13/2015   Penicillins Swelling 05/13/2015   Piroxicam Rash 05/13/2015   Sertraline hcl Rash 05/13/2015    Family History  Problem Relation Age of Onset   Breast cancer Mother 50   Diabetes Mother    CAD Father    Heart attack Father    Breast cancer Sister 45   Breast cancer Sister    Heart attack Sister    Cancer Sister        ovarian   Diabetes Daughter    Kidney failure Daughter    Cancer Brother        lung   Sickle cell anemia Other    Kidney cancer Neg Hx    Bladder Cancer Neg Hx     Social History   Socioeconomic History   Marital status: Divorced    Spouse name: Not on file   Number of children: 3   Years of education: Not on file   Highest education level: 12th grade  Occupational History   Occupation: Retired  Tobacco Use   Smoking status: Every Day    Packs/day: 0.25    Years: 30.00    Pack years: 7.50    Types: Cigarettes   Smokeless  tobacco: Never   Tobacco comments:    Using nicotine lozenges  Vaping Use   Vaping Use: Former  Substance and Sexual Activity   Alcohol use: Yes    Alcohol/week: 1.0 standard drink    Types: 1 Glasses of wine per week    Comment: occasionally   Drug use: No   Sexual activity: Not Currently  Other Topics Concern   Not on file  Social History Narrative   Pt lives alone   Social Determinants of Health   Financial Resource Strain: High Risk   Difficulty of Paying Living Expenses: Hard  Food Insecurity: No Food Insecurity   Worried About Running Out of Food in the  Last Year: Never true   Ran Out of Food in the Last Year: Never true  Transportation Needs: No Transportation Needs   Lack of Transportation (Medical): No   Lack of Transportation (Non-Medical): No  Physical Activity: Sufficiently Active   Days of Exercise per Week: 7 days   Minutes of Exercise per Session: 30 min  Stress: Stress Concern Present   Feeling of Stress : To some extent  Social Connections: Moderately Isolated   Frequency of Communication with Friends and Family: More than three times a week   Frequency of Social Gatherings with Friends and Family: Once a week   Attends Religious Services: More than 4 times per year   Active Member of [employer] or Organizations: No   Attends Archivist Meetings: Never   Marital Status: Divorced  Human resources officer Violence: Not on file    Review of Systems: See HPI, otherwise negative ROS  Physical Exam: BP 120/80    Pulse 75    Temp 97.7 F (36.5 C) (Temporal)    Ht 5' 1"  (1.549 m)    Wt 62.6 kg    LMP  (LMP Unknown)    SpO2 98%    BMI 26.07 kg/m  General:   Alert,  pleasant and cooperative in NAD Head:  Normocephalic and atraumatic. Neck:  Supple; no masses or thyromegaly. Lungs:  Clear throughout to auscultation.    Heart:  Regular rate and rhythm. Abdomen:  Soft, nontender and nondistended. Normal bowel sounds, without guarding, and without rebound.    Neurologic:  Alert and  oriented x4;  grossly normal neurologically.  Impression/Plan: Betty Vaughn is here for an colonoscopy to be performed for h/o gastric ulcer and h/o adenomas colon  Risks, benefits, limitations, and alternatives regarding  EGD and a colonoscopy have been reviewed with the patient.  Questions have been answered.  All parties agreeable.   Sherri Sear, MD  01/08/2022, 8:44 AM

## 2022-01-08 NOTE — Transfer of Care (Signed)
Immediate Anesthesia Transfer of Care Note  Patient: Betty Vaughn  Procedure(s) Performed: COLONOSCOPY WITH PROPOFOL (Rectum) ESOPHAGOGASTRODUODENOSCOPY (EGD) WITH PROPOFOL (Esophagus) POLYPECTOMY (Rectum)  Patient Location: PACU  Anesthesia Type: General  Level of Consciousness: awake, alert  and patient cooperative  Airway and Oxygen Therapy: Patient Spontanous Breathing and Patient connected to supplemental oxygen  Post-op Assessment: Post-op Vital signs reviewed, Patient's Cardiovascular Status Stable, Respiratory Function Stable, Patent Airway and No signs of Nausea or vomiting  Post-op Vital Signs: Reviewed and stable  Complications: No notable events documented.

## 2022-01-08 NOTE — Anesthesia Procedure Notes (Signed)
Date/Time: 01/08/2022 9:44 AM Performed by: Dionne Bucy, CRNA Pre-anesthesia Checklist: Patient identified, Emergency Drugs available, Suction available, Patient being monitored and Timeout performed Patient Re-evaluated:Patient Re-evaluated prior to induction Oxygen Delivery Method: Nasal cannula Placement Confirmation: positive ETCO2

## 2022-01-08 NOTE — Op Note (Signed)
[facility] Gastroenterology Patient Name: Betty Vaughn Procedure Date: 01/08/2022 9:37 AM MRN: [id number] Account #: [id number] Date of Birth: 1951/06/01 Admit Type: Outpatient Age: 71 Room: [facility] OR ROOM 01 Gender: Female Note Status: Finalized Instrument Name: [id number] Procedure:             Colonoscopy Indications:           Surveillance: Personal history of adenomatous polyps                         on last colonoscopy > 3 years ago, Last colonoscopy:                         July 2018 Providers:             Lin Landsman MD, MD Referring MD:          Halina Maidens, MD (Referring MD) Medicines:             General Anesthesia Complications:         No immediate complications. Estimated blood loss: None. Procedure:             Pre-Anesthesia Assessment:                        - Prior to the procedure, a History and Physical was                         performed, and patient medications and allergies were                         reviewed. The patient is competent. The risks and                         benefits of the procedure and the sedation options and                         risks were discussed with the patient. All questions                         were answered and informed consent was obtained.                         Patient identification and proposed procedure were                         verified by the physician, the nurse, the                         anesthesiologist, the anesthetist and the technician                         in the pre-procedure area in the procedure room in the                         endoscopy suite. Mental Status Examination: alert and                         oriented. Airway Examination: normal oropharyngeal  airway and neck mobility. Respiratory Examination:                         clear to auscultation. CV Examination: normal.                         Prophylactic Antibiotics: The patient does  not require                         prophylactic antibiotics. Prior Anticoagulants: The                         patient has taken no previous anticoagulant or                         antiplatelet agents. ASA Grade Assessment: III - A                         patient with severe systemic disease. After reviewing                         the risks and benefits, the patient was deemed in                         satisfactory condition to undergo the procedure. The                         anesthesia plan was to use general anesthesia.                         Immediately prior to administration of medications,                         the patient was re-assessed for adequacy to receive                         sedatives. The heart rate, respiratory rate, oxygen                         saturations, blood pressure, adequacy of pulmonary                         ventilation, and response to care were monitored                         throughout the procedure. The physical status of the                         patient was re-assessed after the procedure.                        After obtaining informed consent, the colonoscope was                         passed under direct vision. Throughout the procedure,                         the patient's blood pressure, pulse, and oxygen  saturations were monitored continuously. The                         Colonoscope was introduced through the anus and                         advanced to the the cecum, identified by appendiceal                         orifice and ileocecal valve. The colonoscopy was                         performed without difficulty. The patient tolerated                         the procedure well. The quality of the bowel                         preparation was evaluated using the BBPS [facility] Bowel                         Preparation Scale) with scores of: Right Colon = 3,                         Transverse Colon = 3 and  Left Colon = 3 (entire mucosa                         seen well with no residual staining, small fragments                         of stool or opaque liquid). The total BBPS score                         equals 9. Findings:      The perianal and digital rectal examinations were normal. Pertinent       negatives include normal sphincter tone and no palpable rectal lesions.      A 9 mm polyp was found in the ascending colon. The polyp was flat.       Preparations were made for mucosal resection. Chromoscopy with methylene       blue was done to mark the borders of the lesion. 2 mL of [place] was       injected with adequate lift of the lesion from the muscularis propria.       Snare mucosal resection with suction (via the working channel) retrieval       was performed. An 11 mm area was resected. Resection and retrieval were       complete. There was no bleeding during and at the end of the procedure.       To prevent bleeding after mucosal resection, one hemostatic clip was       successfully placed (MR conditional). There was no bleeding during, or       at the end, of the procedure. Estimated blood loss: none.      A 8 mm polyp was found in the ascending colon. The polyp was sessile.       The polyp was removed with a hot snare. Resection and retrieval were  complete. Estimated blood loss: none.      Three sessile polyps were found in the rectum. The polyps were 4 to 6 mm       in size. These polyps were removed with a cold snare. Resection and       retrieval were complete. Estimated blood loss: none.      A few diverticula were found in the entire colon.      The retroflexed view of the distal rectum and anal verge was normal and       showed no anal or rectal abnormalities. Impression:            - One 9 mm polyp in the ascending colon, removed with                         mucosal resection. Resected and retrieved. Clip (MR                         conditional) was placed.                         - One 8 mm polyp in the ascending colon, removed with                         a hot snare. Resected and retrieved.                        - Three 4 to 6 mm polyps in the rectum, removed with a                         cold snare. Resected and retrieved.                        - Diverticulosis in the entire examined colon.                        - The distal rectum and anal verge are normal on                         retroflexion view.                        - Mucosal resection was performed. Resection and                         retrieval were complete. Recommendation:        - Discharge patient to home (with escort).                        - Resume previous diet today.                        - Continue present medications.                        - Await pathology results.                        - Repeat colonoscopy in 3 years for surveillance of  multiple polyps. Procedure Code(s):     --- Professional ---                        [phone], Colonoscopy, flexible; with endoscopic mucosal                         resection                        [id number], 35, Colonoscopy, flexible; with removal of                         tumor(s), polyp(s), or other lesion(s) by snare                         technique Diagnosis Code(s):     --- Professional ---                        Z86.010, Personal history of colonic polyps                        K63.5, Polyp of colon                        K62.1, Rectal polyp                        K57.30, Diverticulosis of large intestine without                         perforation or abscess without bleeding CPT copyright 2019 American Medical Association. All rights reserved. The codes documented in this report are preliminary and upon coder review may  be revised to meet current compliance requirements. Dr. Ulyess Mort Lin Landsman MD, MD 01/08/2022 10:30:41 AM This report has been signed electronically. Number of Addenda:  0 Note Initiated On: 01/08/2022 9:37 AM Scope Withdrawal Time: 0 hours 17 minutes 45 seconds  Total Procedure Duration: 0 hours 20 minutes 14 seconds  Estimated Blood Loss:  Estimated blood loss: none.      [facility]

## 2022-01-08 NOTE — Anesthesia Postprocedure Evaluation (Signed)
Anesthesia Post Note  Patient: Betty Vaughn  Procedure(s) Performed: COLONOSCOPY WITH PROPOFOL (Rectum) ESOPHAGOGASTRODUODENOSCOPY (EGD) WITH PROPOFOL (Esophagus) POLYPECTOMY (Rectum)     Patient location during evaluation: PACU Anesthesia Type: General Level of consciousness: awake and alert Pain management: pain level controlled Vital Signs Assessment: post-procedure vital signs reviewed and stable Respiratory status: spontaneous breathing and nonlabored ventilation Cardiovascular status: blood pressure returned to baseline Postop Assessment: no apparent nausea or vomiting Anesthetic complications: no   No notable events documented.  Kaiesha Tonner [employer]

## 2022-01-08 NOTE — Anesthesia Preprocedure Evaluation (Signed)
Anesthesia Evaluation  Patient identified by MRN, date of birth, ID band Patient awake    Reviewed: Allergy & Precautions, NPO status , Patient's Chart, lab work & pertinent test results  Airway Mallampati: II  TM Distance: >3 FB Neck ROM: Full    Dental no notable dental hx.    Pulmonary Current Smoker and Patient abstained from smoking.,    Pulmonary exam normal        Cardiovascular hypertension, + CAD  Normal cardiovascular exam     Neuro/Psych  Headaches, PSYCHIATRIC DISORDERS Depression    GI/Hepatic GERD  Controlled,  Endo/Other  diabetes  Renal/GU Renal InsufficiencyRenal disease     Musculoskeletal  (+) Arthritis ,   Abdominal Normal abdominal exam  (+)   Peds  Hematology   Anesthesia Other Findings   Reproductive/Obstetrics                             Anesthesia Physical Anesthesia Plan  ASA: 3  Anesthesia Plan: General   Post-op Pain Management: Minimal or no pain anticipated   Induction: Intravenous  PONV Risk Score and Plan: 2 and Propofol infusion, TIVA and Treatment may vary due to age or medical condition  Airway Management Planned: Nasal Cannula and Natural Airway  Additional Equipment: None  Intra-op Plan:   Post-operative Plan:   Informed Consent: I have reviewed the patients History and Physical, chart, labs and discussed the procedure including the risks, benefits and alternatives for the proposed anesthesia with the patient or authorized representative who has indicated his/her understanding and acceptance.     Dental advisory given  Plan Discussed with: CRNA  Anesthesia Plan Comments:         Anesthesia Quick Evaluation

## 2022-01-09 ENCOUNTER — Encounter: Payer: Self-pay | Admitting: Gastroenterology

## 2022-01-10 ENCOUNTER — Encounter: Payer: Self-pay | Admitting: Gastroenterology

## 2022-01-10 LAB — SURGICAL PATHOLOGY

## 2022-01-21 ENCOUNTER — Other Ambulatory Visit: Payer: Self-pay | Admitting: Internal Medicine

## 2022-01-21 DIAGNOSIS — J3089 Other allergic rhinitis: Secondary | ICD-10-CM

## 2022-01-22 NOTE — Telephone Encounter (Signed)
Requested Prescriptions  Pending Prescriptions Disp Refills   loratadine (CLARITIN) 10 MG tablet [Pharmacy Med Name: LORATADINE 10MG  TABLETS] 90 tablet 0    Sig: TAKE 1 TABLET(10 MG) BY MOUTH DAILY     Ear, Nose, and Throat:  Antihistamines 2 Passed - 01/21/2022  6:27 AM      Passed - Cr in normal range and within 360 days    Creatinine  Date Value Ref Range Status  07/06/2013 0.91 0.60 - 1.30 mg/dL Final   Creatinine, Ser  Date Value Ref Range Status  10/16/2021 0.96 0.57 - 1.00 mg/dL Final         Passed - Valid encounter within last 12 months    Recent Outpatient Visits          3 months ago Annual physical exam   [facility] Glean Hess, MD   8 months ago Essential hypertension   [place], [place]   9 months ago Essential hypertension   [facility] Glean Hess, MD   12 months ago Tobacco use disorder   [facility] Glean Hess, MD   1 year ago Lumbar disc herniation with radiculopathy   [facility] Glean Hess, MD      Future Appointments            In 2 months [name] [name], MD [facility]

## 2022-02-01 ENCOUNTER — Other Ambulatory Visit: Payer: Self-pay | Admitting: Internal Medicine

## 2022-02-01 DIAGNOSIS — F17200 Nicotine dependence, unspecified, uncomplicated: Secondary | ICD-10-CM

## 2022-02-01 NOTE — Telephone Encounter (Signed)
Requested Prescriptions  ?Pending Prescriptions Disp Refills  ?? albuterol (VENTOLIN HFA) 108 (90 Base) MCG/ACT inhaler [Pharmacy Med Name: ALBUTEROL HFA INH (200 PUFFS) 6.7GM] 18 g 1  ?  Sig: INHALE 2 PUFFS INTO THE LUNGS EVERY 6 HOURS AS NEEDED FOR WHEEZING OR SHORTNESS OF BREATH  ?  ? Pulmonology:  Beta Agonists 2 Passed - 02/01/2022  3:39 AM  ?  ?  Passed - Last BP in normal range  ?  BP Readings from Last 1 Encounters:  ?01/08/22 104/82  ?   ?  ?  Passed - Last Heart Rate in normal range  ?  Pulse Readings from Last 1 Encounters:  ?01/08/22 64  ?   ?  ?  Passed - Valid encounter within last 12 months  ?  Recent Outpatient Visits   ?      ? 3 months ago Annual physical exam  ? [facility] Glean Hess, MD  ? 8 months ago Essential hypertension  ? [facility] Glean Hess, MD  ? 10 months ago Essential hypertension  ? [facility] Glean Hess, MD  ? 1 year ago Tobacco use disorder  ? [facility] Glean Hess, MD  ? 1 year ago Lumbar disc herniation with radiculopathy  ? [facility] Glean Hess, MD  ?  ?  ?Future Appointments   ?        ? In 2 months [name] [name], MD [facility] [place]  ?  ? ?  ?  ?  ? ?

## 2022-03-13 IMAGING — CT CT ABD-PELV W/ CM
2 of 5 series · 15 of 46 positions shown, 17 images · IV contrast (omnipaque)
Comparison: Abdominal CT 07/06/2013

CLINICAL DATA: Abdominal pain, acute, nonlocalized diffuse chronic
pain. difficult exam due to voluntary guarding. Hx hysterectomy and
cholecystectomy

EXAM:
CT ABDOMEN AND PELVIS WITH CONTRAST
TECHNIQUE: Multidetector CT imaging of the abdomen and pelvis was performed
using the standard protocol following bolus administration of
intravenous contrast.
CONTRAST:  100mL OMNIPAQUE IOHEXOL 300 MG/ML  SOLN

[Series 2: routine abd/pel with · axial · 0.88mm/px · z∈[-768,-393]mm · 12 of 85 slices shown, 14 images]
[im 5/85  soft-tissue]
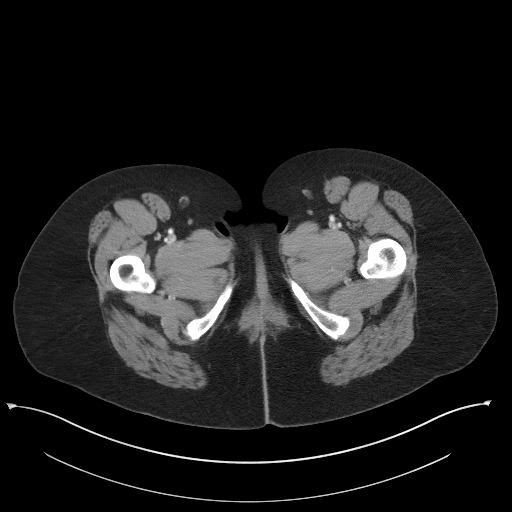
[im 5/85  bone]
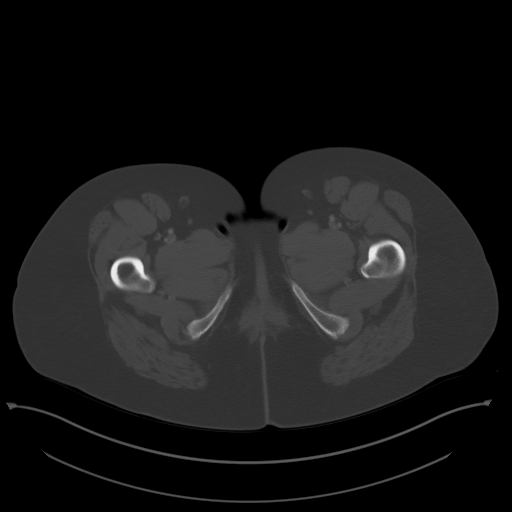
[im 13/85  soft-tissue]
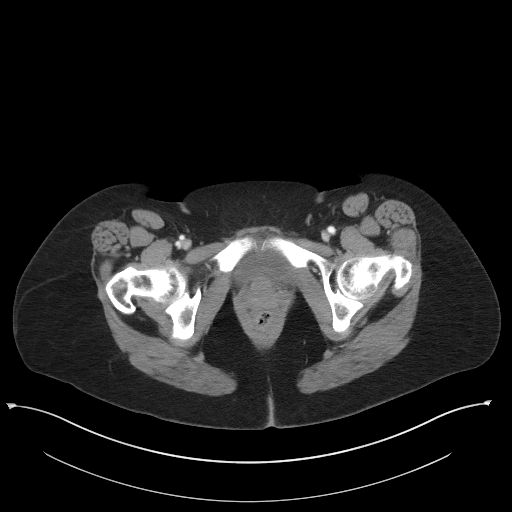
[im 17/85  soft-tissue]
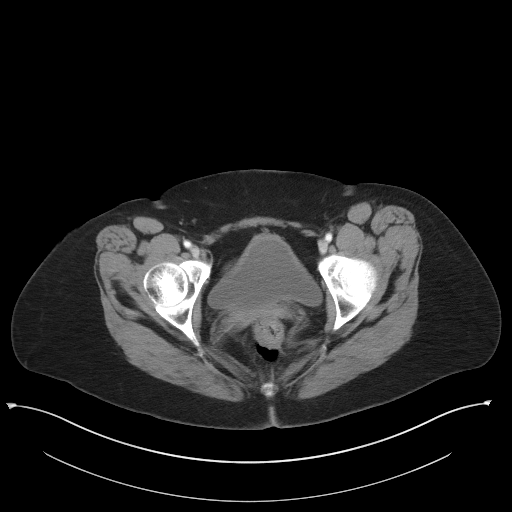
[im 26/85  soft-tissue]
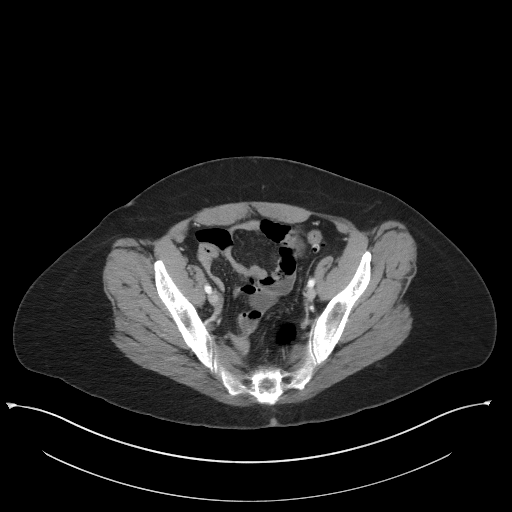
[im 34/85  soft-tissue]
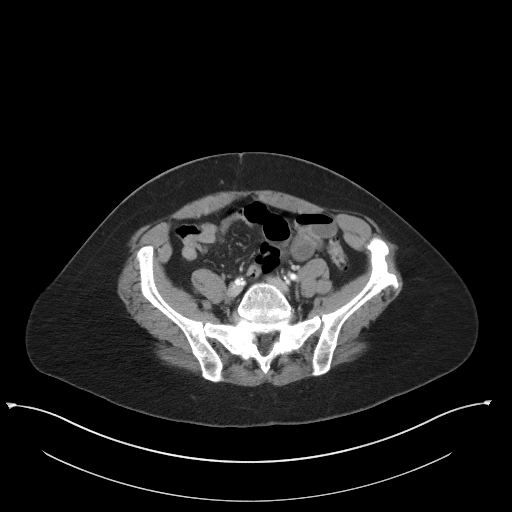
[im 38/85  soft-tissue]
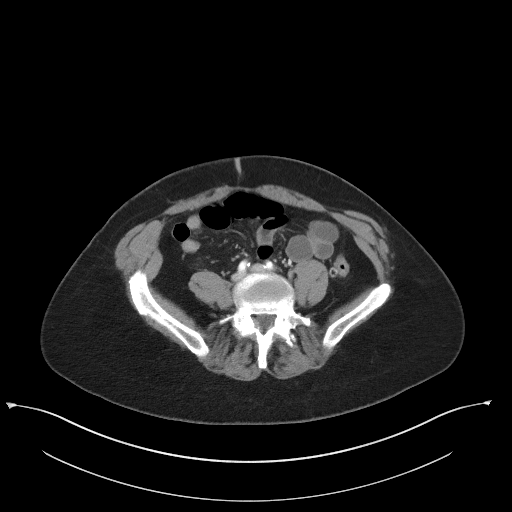
[im 47/85  soft-tissue]
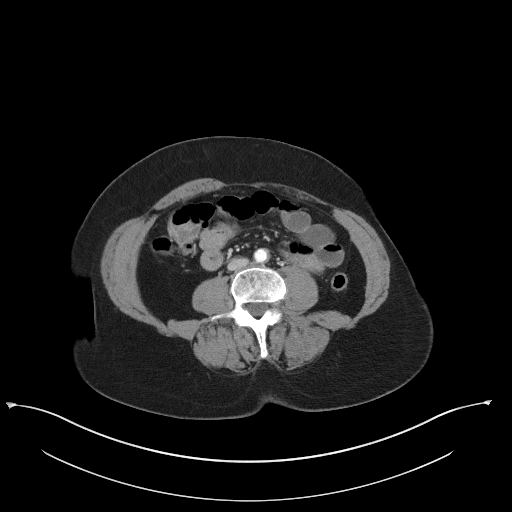
[im 51/85  soft-tissue]
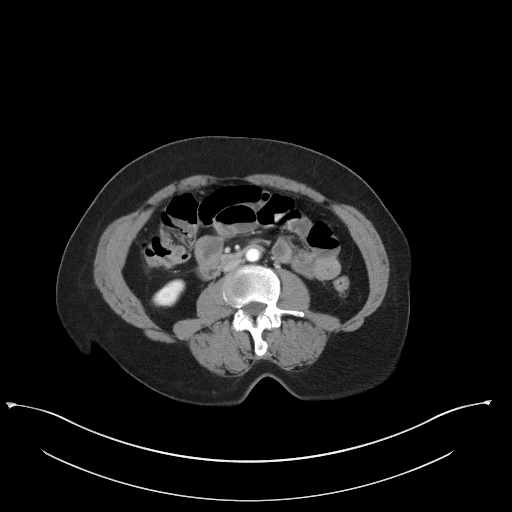
[im 59/85  soft-tissue]
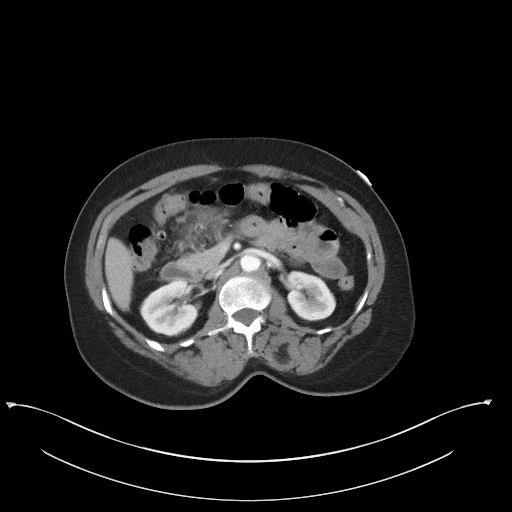
[im 59/85  bone]
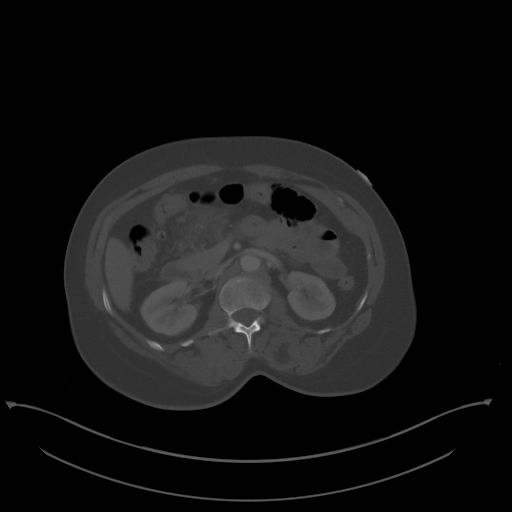
[im 68/85  soft-tissue]
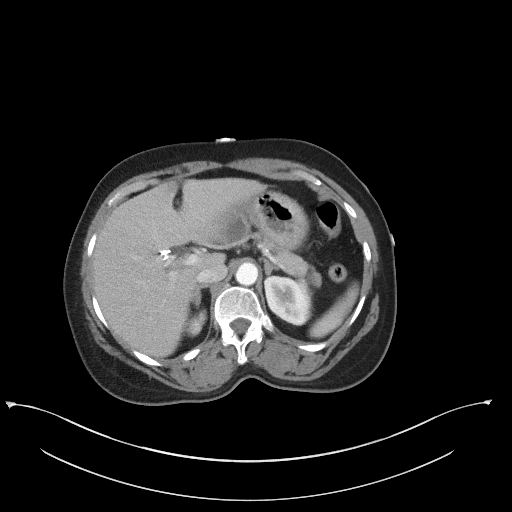
[im 72/85  soft-tissue]
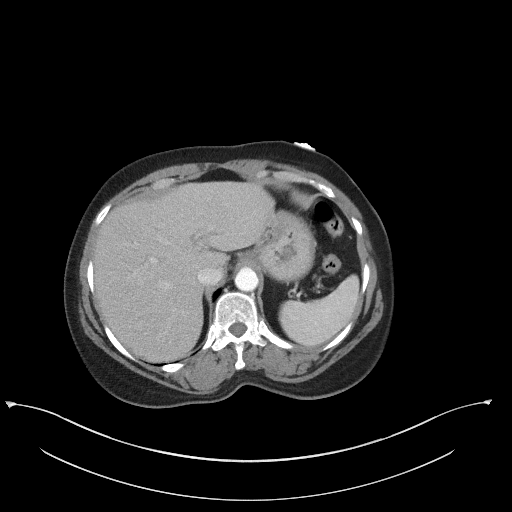
[im 80/85  soft-tissue]
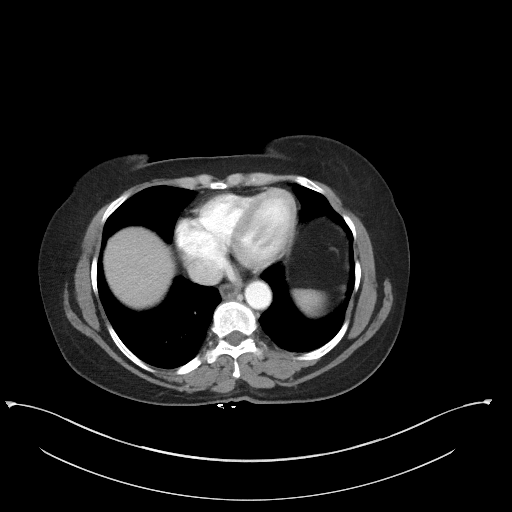

[Series 5: coronal st · coronal · 0.85mm/px · 3 of 79 slices shown]
[im 27/79  soft-tissue]
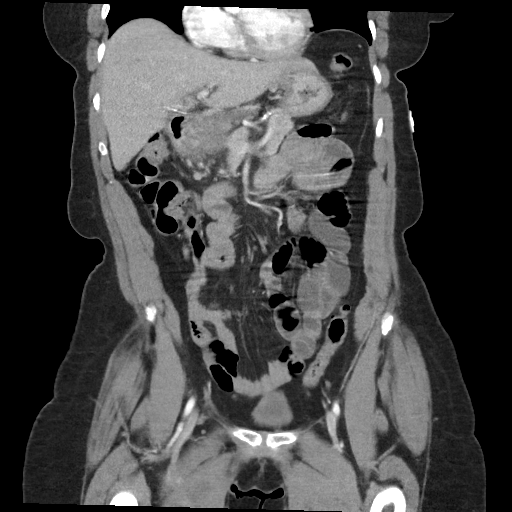
[im 35/79  soft-tissue]
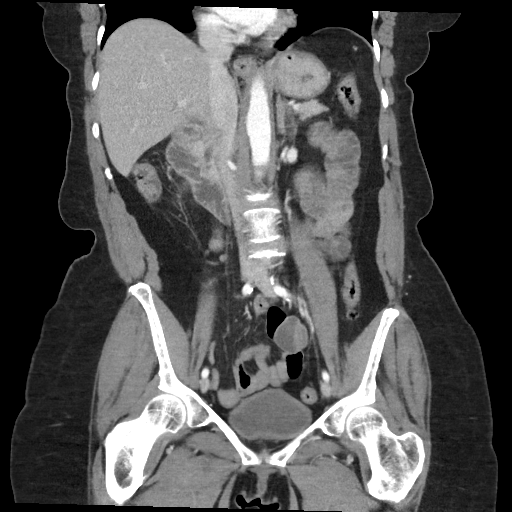
[im 44/79  soft-tissue]
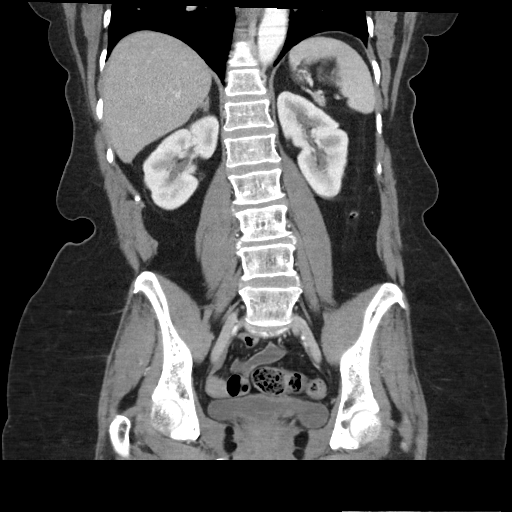

[15 of 46 positions shown; findings below may reference images not displayed]

FINDINGS: Lower chest: No pleural effusion or focal airspace disease. Small
hiatal hernia with wall thickening of the distal esophagus.

Hepatobiliary: Mild diffuse hepatic steatosis. No focal hepatic
lesion. Mild focal fatty infiltration adjacent to the falciform
ligament. Cholecystectomy with prominent common bile duct measuring
9 mm, within normal limits for post cholecystectomy status. Previous
intrahepatic biliary ductal dilatation has resolved.

Pancreas: No ductal dilatation or inflammation. Tiny hypodensity in
the proximal pancreatic body, series 2, image 25 measures
approximately 6 x 4 mm.

Spleen: Normal in size without focal abnormality.

Adrenals/Urinary Tract: No adrenal nodule. Slight left adrenal
thickening. No hydronephrosis or perinephric edema. Homogeneous
renal enhancement with symmetric excretion on delayed phase imaging.
Slightly lobulated bilateral renal contours. There are tiny
bilateral renal cortical hypodensities that are too small to
characterize. Urinary bladder is partially distended with equivocal
wall thickening.

Stomach/Bowel: Small hiatal hernia with mild wall thickening of the
distal esophagus. There is abnormal peri pyloric wall thickening
about the stomach with adjacent fat stranding. Fluid density
outpouching with thinning of the gastric wall inferiorly in the
region of wall thickening is suspicious for gastric ulcer, series 6,
image 44 and series 2, image 25. There is no adjacent free air or
focal fluid collection. No small bowel obstruction, evident wall
thickening or inflammation. Normal appendix. Cecum is slightly
high-riding in the right mid abdomen. Colonic diverticulosis,
prominent in the descending and sigmoid colon, occasional in the
right colon. No acute diverticulitis.

Vascular/Lymphatic: Normal caliber abdominal aorta. Portal vein is
patent. No portal venous or mesenteric gas. Small perigastric lymph
node in the region of inflammation measuring 7 mm, series 5, image
27, likely reactive but nonspecific.

Reproductive: Status post hysterectomy. No adnexal masses.

Other: Fat stranding in the upper abdomen adjacent gastric wall
thickening. No significant ascites or free fluid. There is no free
air or focal fluid collection. No abdominal wall hernia.

Musculoskeletal: There are no acute or suspicious osseous
abnormalities.
IMPRESSION: 1. Abnormal peri pyloric wall thickening about the stomach with
adjacent fat stranding. Fluid density outpouching with thinning of
the gastric wall inferiorly in the region of wall thickening is
suspicious for gastric ulcer. A degree of surrounding fat stranding
raises concern for impending or contained perforation, however no
adjacent free air, ascites, or focal fluid collection. Recommend
correlation with endoscopy to exclude possibility of underlying
neoplasm.
2. Small hiatal hernia with mild wall thickening of the distal
esophagus, can be seen with reflux or esophagitis.
3. Colonic diverticulosis without diverticulitis.
4. Hepatic steatosis.
5. Tiny hypodensity in the proximal pancreatic body measures 6 x 4
mm, likely benign, and in general stable from 7424 exam. Recommend
follow up pre and post contrast MRI/MRCP or pancreatic protocol CT
in 2 years. This recommendation follows ACR consensus guidelines:
Management of Incidental Pancreatic Cysts: A White Paper of the ACR
Incidental Findings Committee. [HOSPITAL] 7702;[DATE].
6. Equivocal bladder wall thickening.

Aortic Atherosclerosis (DYECY-X5C.C).

## 2022-04-15 ENCOUNTER — Ambulatory Visit: Payer: Medicare Other | Admitting: Internal Medicine

## 2022-06-11 ENCOUNTER — Other Ambulatory Visit: Payer: Self-pay | Admitting: Internal Medicine

## 2022-06-11 DIAGNOSIS — F17200 Nicotine dependence, unspecified, uncomplicated: Secondary | ICD-10-CM

## 2022-06-12 NOTE — Telephone Encounter (Signed)
Requested Prescriptions  Pending Prescriptions Disp Refills  . albuterol (VENTOLIN HFA) 108 (90 Base) MCG/ACT inhaler [Pharmacy Med Name: Albuterol Sulfate HFA 108 (90 Base) MCG/ACT Inhalation Aerosol Solution] 36 g 0    Sig: INHALE 2 PUFFS BY MOUTH EVERY 6 HOURS AS NEEDED FOR WHEEZING AND FOR SHORTNESS OF BREATH     Pulmonology:  Beta Agonists 2 Passed - 06/11/2022  7:26 PM      Passed - Last BP in normal range    BP Readings from Last 1 Encounters:  01/08/22 104/82         Passed - Last Heart Rate in normal range    Pulse Readings from Last 1 Encounters:  01/08/22 64         Passed - Valid encounter within last 12 months    Recent Outpatient Visits          7 months ago Annual physical exam   [facility] Glean [facility], [facility]   1 year ago Essential hypertension   [facility] Glean Hess, MD   1 year ago Essential hypertension   [facility] Glean Hess, MD   1 year ago Tobacco use disorder   [facility] Glean Hess, [facility]   1 year ago Lumbar disc herniation with radiculopathy   [facility] Glean Hess, MD

## 2022-06-27 ENCOUNTER — Other Ambulatory Visit: Payer: Self-pay | Admitting: Internal Medicine

## 2022-06-27 DIAGNOSIS — F17200 Nicotine dependence, unspecified, uncomplicated: Secondary | ICD-10-CM

## 2022-06-28 NOTE — Telephone Encounter (Signed)
Requested Prescriptions  Pending Prescriptions Disp Refills  . albuterol (VENTOLIN HFA) 108 (90 Base) MCG/ACT inhaler [Pharmacy Med Name: Albuterol Sulfate HFA 108 (90 Base) MCG/ACT Inhalation Aerosol Solution] 18 g 0    Sig: INHALE 2 PUFFS BY MOUTH EVERY 6 HOURS AS NEEDED FOR WHEEZING AND FOR SHORTNESS OF BREATH     Pulmonology:  Beta Agonists 2 Passed - 06/27/2022  1:32 PM      Passed - Last BP in normal range    BP Readings from Last 1 Encounters:  01/08/22 104/82         Passed - Last Heart Rate in normal range    Pulse Readings from Last 1 Encounters:  01/08/22 64         Passed - Valid encounter within last 12 months    Recent Outpatient Visits          8 months ago Annual physical exam   [facility] Glean Hess, MD   1 year ago Essential hypertension   [facility] Glean Hess, MD   1 year ago Essential hypertension   [facility] Glean Hess, MD   1 year ago Tobacco use disorder   [facility] Glean Hess, MD   1 year ago Lumbar disc herniation with radiculopathy   [facility] Glean Hess, MD

## 2022-06-30 ENCOUNTER — Other Ambulatory Visit: Payer: Self-pay | Admitting: Internal Medicine

## 2022-06-30 DIAGNOSIS — F5104 Psychophysiologic insomnia: Secondary | ICD-10-CM

## 2022-07-01 ENCOUNTER — Other Ambulatory Visit: Payer: Self-pay | Admitting: Internal Medicine

## 2022-07-01 DIAGNOSIS — R7309 Other abnormal glucose: Secondary | ICD-10-CM

## 2022-07-01 DIAGNOSIS — F17200 Nicotine dependence, unspecified, uncomplicated: Secondary | ICD-10-CM

## 2022-07-01 DIAGNOSIS — F5104 Psychophysiologic insomnia: Secondary | ICD-10-CM

## 2022-07-01 DIAGNOSIS — J3089 Other allergic rhinitis: Secondary | ICD-10-CM

## 2022-07-02 NOTE — Telephone Encounter (Signed)
Called pt and LM on VM to call office to schedule appointment Requested Prescriptions  Pending Prescriptions Disp Refills  . DULoxetine (CYMBALTA) 60 MG capsule [Pharmacy Med Name: DULoxetine HCl 60 MG Oral Capsule Delayed Release Particles] 30 capsule 0    Sig: Take 1 capsule by mouth once daily     Psychiatry: Antidepressants - SNRI - duloxetine Failed - 06/30/2022  6:13 PM      Failed - Valid encounter within last 6 months    Recent Outpatient Visits          8 months ago Annual physical exam   [facility] Glean [facility], MD   1 year ago Essential hypertension   [facility] Glean Hess, MD   1 year ago Essential hypertension   [facility] Glean Hess, MD   1 year ago Tobacco use disorder   [facility] Glean Hess, [facility]   1 year ago Lumbar disc herniation with radiculopathy   [facility] Glean [facility], [facility]             Passed - Cr in normal range and within 360 days    Creatinine  Date Value Ref Range Status  07/06/2013 0.91 0.60 - 1.30 mg/dL Final   Creatinine, Ser  Date Value Ref Range Status  10/16/2021 0.96 0.57 - 1.00 mg/dL Final         Passed - eGFR is 30 or above and within 360 days    EGFR (African American)  Date Value Ref Range Status  07/06/2013 >60  Final   GFR calc Af Amer  Date Value Ref Range Status  10/13/2020 67 >59 mL/min/1.73 Final    Comment:    **In accordance with recommendations from the NKF-ASN Task force,**   [facility] is in the process of updating its eGFR calculation to the   2021 CKD-EPI creatinine equation that estimates kidney function   without a race variable.    EGFR (Non-African Amer.)  Date Value Ref Range Status  07/06/2013 >60  Final    Comment:    eGFR values <25m/min/1.73 m2 may be an indication of chronic kidney disease (CKD). Calculated eGFR is useful in patients with stable renal function. The eGFR calculation will not be reliable in acutely ill  patients when serum creatinine is changing rapidly. It is not useful in  patients on dialysis. The eGFR calculation may not be applicable to patients at the low and high extremes of body sizes, pregnant women, and vegetarians.    GFR, Estimated  Date Value Ref Range Status  05/09/2021 >60 >60 mL/min Final    Comment:    (NOTE) Calculated using the CKD-EPI Creatinine Equation (2021)    eGFR  Date Value Ref Range Status  10/16/2021 64 >59 mL/min/1.73 Final         Passed - Completed PHQ-2 or PHQ-9 in the last 360 days      Passed - Last BP in normal range    BP Readings from Last 1 Encounters:  01/08/22 104/82         . traZODone (DESYREL) 50 MG tablet [Pharmacy Med Name: traZODone HCl 50 MG Oral Tablet] 30 tablet 0    Sig: TAKE 1 TABLET BY MOUTH AT BEDTIME     Psychiatry: Antidepressants - Serotonin Modulator Failed - 06/30/2022  6:13 PM      Failed - Valid encounter within last 6 months    Recent Outpatient Visits  8 months ago Annual physical exam   [facility] Glean Hess, MD   1 year ago Essential hypertension   [facility] Glean [facility], MD   1 year ago Essential hypertension   [facility] Glean Hess, MD   1 year ago Tobacco use disorder   [facility] Glean Hess, MD   1 year ago Lumbar disc herniation with radiculopathy   [facility] Glean Hess, MD             Passed - Completed PHQ-2 or PHQ-9 in the last 360 days

## 2022-07-02 NOTE — Telephone Encounter (Signed)
Requested medication (s) are due for refill today: Yes  Requested medication (s) are on the active medication list: Yes  Last refill:  10/17/21  Future visit scheduled: Yes  Notes to clinic:  Robaxin - not delegated. Trazodone already filled.    Requested Prescriptions  Pending Prescriptions Disp Refills   methocarbamol (ROBAXIN) 500 MG tablet [Pharmacy Med Name: EHUDJSHFWYOVZ '500MG'$  TABLETS] 120 tablet 5    Sig: TAKE 1 TABLET BY MOUTH FOUR TIMES DAILY     Not Delegated - Analgesics:  Muscle Relaxants Failed - 07/01/2022  5:48 PM      Failed - This refill cannot be delegated      Failed - Valid encounter within last 6 months    Recent Outpatient Visits           8 months ago Annual physical exam   [facility] Glean Hess, MD   1 year ago Essential hypertension   [place], Laura H, MD   1 year ago Essential hypertension   [facility] Glean Hess, MD   1 year ago Tobacco use disorder   [facility] Glean Hess, MD   1 year ago Lumbar disc herniation with radiculopathy   [facility] Glean Hess, MD               traZODone (DESYREL) 50 MG tablet [Pharmacy Med Name: TRAZODONE '50MG'$  TABLETS] 30 tablet 5    Sig: TAKE 1 TABLET(50 MG) BY MOUTH AT BEDTIME     Psychiatry: Antidepressants - Serotonin Modulator Failed - 07/01/2022  5:48 PM      Failed - Valid encounter within last 6 months    Recent Outpatient Visits           8 months ago Annual physical exam   [facility] Glean Hess, MD   1 year ago Essential hypertension   [place], Laura H, MD   1 year ago Essential hypertension   [facility] Glean Hess, MD   1 year ago Tobacco use disorder   [facility] Glean Hess, MD   1 year ago Lumbar disc herniation with radiculopathy   [facility] Glean Hess, MD              Passed - Completed PHQ-2 or  PHQ-9 in the last 360 days      Signed Prescriptions Disp Refills   ACCU-CHEK GUIDE test strip 100 strip 2    Sig: [place]     Endocrinology: Diabetes - Testing Supplies Passed - 07/01/2022  5:48 PM      Passed - Valid encounter within last 12 months    Recent Outpatient Visits           8 months ago Annual physical exam   [facility] Glean Hess, MD   1 year ago Essential hypertension   [facility] Glean Hess, MD   1 year ago Essential hypertension   [facility] Glean Hess, MD   1 year ago Tobacco use disorder   [facility] Glean Hess, MD   1 year ago Lumbar disc herniation with radiculopathy   [facility] Glean Hess, MD               Accu-Chek FastClix Lancets MISC 102 each 3    Sig: USE 1 LANCET TWICE DAILY  Endocrinology: Diabetes - Testing Supplies Passed - 07/01/2022  5:48 PM      Passed - Valid encounter within last 12 months    Recent Outpatient Visits           8 months ago Annual physical exam   [facility] Glean Hess, MD   1 year ago Essential hypertension   [facility] Gle[facility], [facility]   1 year ago Essential hypertension   [facility] Glean Hess, MD   1 year ago Tobacco use disorder   [facility] Glean Hess, MD   1 year ago Lumbar disc herniation with radiculopathy   [facility] Glean Hess, MD               metoprolol tartrate (LOPRESSOR) 50 MG tablet 180 tablet 1    Sig: TAKE 1 TABLET BY MOUTH TWICE DAILY     Cardiovascular:  Beta Blockers Failed - 07/01/2022  5:48 PM      Failed - Valid encounter within last 6 months    Recent Outpatient Visits           8 months ago Annual physical exam   [facility] Glean Hess, MD   1 year ago Essential hypertension   [place], Laura H, MD   1 year ago Essential hypertension    [facility] Glean Hess, MD   1 year ago Tobacco use disorder   [facility] Glean Hess, MD   1 year ago Lumbar disc herniation with radiculopathy   [facility] Glean Hess, MD              Passed - Last BP in normal range    BP Readings from Last 1 Encounters:  01/08/22 104/82         Passed - Last Heart Rate in normal range    Pulse Readings from Last 1 Encounters:  01/08/22 64          atorvastatin (LIPITOR) 40 MG tablet 90 tablet 1    Sig: TAKE 1 TABLET BY MOUTH EVERY DAY     Cardiovascular:  Antilipid - Statins Failed - 07/01/2022  5:48 PM      Failed - Lipid Panel in normal range within the last 12 months    Cholesterol, Total  Date Value Ref Range Status  10/16/2021 158 100 - 199 mg/dL Final   LDL Chol Calc (NIH)  Date Value Ref Range Status  10/16/2021 90 0 - 99 mg/dL Final   HDL  Date Value Ref Range Status  10/16/2021 50 >39 mg/dL Final   Triglycerides  Date Value Ref Range Status  10/16/2021 99 0 - 149 mg/dL Final         Passed - Patient is not pregnant      Passed - Valid encounter within last 12 months    Recent Outpatient Visits           8 months ago Annual physical exam   [facility] Glean Hess, MD   1 year ago Essential hypertension   [facility] Glean Hess, MD   1 year ago Essential hypertension   [facility] Glean Hess, MD   1 year ago Tobacco use disorder   [facility] Glean Hess, MD   1 year ago Lumbar disc herniation with radiculopathy   [facility] Glean Hess,  MD               potassium chloride SA (KLOR-CON M) 20 MEQ tablet 90 tablet 1    Sig: TAKE 1 TABLET BY MOUTH EVERY DAY     Endocrinology:  Minerals - Potassium Supplementation Passed - 07/01/2022  5:48 PM      Passed - K in normal range and within 360 days    Potassium  Date Value Ref Range Status  10/16/2021 4.0 3.5 - 5.2 mmol/L  Final  07/06/2013 3.6 3.5 - 5.1 mmol/L Final         Passed - Cr in normal range and within 360 days    Creatinine  Date Value Ref Range Status  07/06/2013 0.91 0.60 - 1.30 mg/dL Final   Creatinine, Ser  Date Value Ref Range Status  10/16/2021 0.96 0.57 - 1.00 mg/dL Final         Passed - Valid encounter within last 12 months    Recent Outpatient Visits           8 months ago Annual physical exam   [facility] Glean Hess, MD   1 year ago Essential hypertension   [facility] Glean Hess, MD   1 year ago Essential hypertension   [facility] Glean Hess, MD   1 year ago Tobacco use disorder   [facility] Glean Hess, MD   1 year ago Lumbar disc herniation with radiculopathy   [facility] Glean Hess, MD               amLODipine (NORVASC) 5 MG tablet 90 tablet 1    Sig: TAKE 1 TABLET BY MOUTH AT BEDTIME     Cardiovascular: Calcium Channel Blockers 2 Failed - 07/01/2022  5:48 PM      Failed - Valid encounter within last 6 months    Recent Outpatient Visits           8 months ago Annual physical exam   [facility] Glean Hess, MD   1 year ago Essential hypertension   [facility] Glean Hess, MD   1 year ago Essential hypertension   [facility] Glean Hess, MD   1 year ago Tobacco use disorder   [facility] Glean Hess, MD   1 year ago Lumbar disc herniation with radiculopathy   [facility] Glean Hess, MD              Passed - Last BP in normal range    BP Readings from Last 1 Encounters:  01/08/22 104/82         Passed - Last Heart Rate in normal range    Pulse Readings from Last 1 Encounters:  01/08/22 64          chlorthalidone (HYGROTON) 25 MG tablet 90 tablet 1    Sig: TAKE 1 TABLET BY MOUTH EVERY DAY     Cardiovascular: Diuretics - Thiazide Failed - 07/01/2022  5:48 PM      Failed - Cr  in normal range and within 180 days    Creatinine  Date Value Ref Range Status  07/06/2013 0.91 0.60 - 1.30 mg/dL Final   Creatinine, Ser  Date Value Ref Range Status  10/16/2021 0.96 0.57 - 1.00 mg/dL Final         Failed - K in normal range and within 180 days    Potassium  Date Value Ref Range Status  10/16/2021 4.0 3.5 - 5.2 mmol/L Final  07/06/2013 3.6 3.5 - 5.1 mmol/L Final         Failed - Na in normal range and within 180 days    Sodium  Date Value Ref Range Status  10/16/2021 139 134 - 144 mmol/L Final  07/06/2013 137 136 - 145 mmol/L Final         Failed - Valid encounter within last 6 months    Recent Outpatient Visits           8 months ago Annual physical exam   [facility] Glean Hess, MD   1 year ago Essential hypertension   [facility] Glean Hess, MD   1 year ago Essential hypertension   [facility] Glean Hess, MD   1 year ago Tobacco use disorder   [facility] Glean Hess, [facility]   1 year ago Lumbar disc herniation with radiculopathy   [facility] Glean Hess, MD              Passed - Last BP in normal range    BP Readings from Last 1 Encounters:  01/08/22 104/82          albuterol (VENTOLIN HFA) 108 (90 Base) MCG/ACT inhaler 18 g 1    Sig: INHALE 2 PUFFS INTO THE LUNGS EVERY 6 HOURS AS NEEDED FOR WHEEZING OR SHORTNESS OF BREATH     Pulmonology:  Beta Agonists 2 Passed - 07/01/2022  5:48 PM      Passed - Last BP in normal range    BP Readings from Last 1 Encounters:  01/08/22 104/82         Passed - Last Heart Rate in normal range    Pulse Readings from Last 1 Encounters:  01/08/22 64         Passed - Valid encounter within last 12 months    Recent Outpatient Visits           8 months ago Annual physical exam   [facility] Gle[facility], MD   1 year ago Essential hypertension   [facility] Glean Hess, MD   1 year ago  Essential hypertension   [facility] Glean Hess, MD   1 year ago Tobacco use disorder   [facility] Glean Hess, MD   1 year ago Lumbar disc herniation with radiculopathy   [facility] Glean Hess, MD               loratadine (CLARITIN) 10 MG tablet 90 tablet 0    Sig: TAKE 1 TABLET(10 MG) BY MOUTH DAILY     Ear, Nose, and Throat:  Antihistamines 2 Passed - 07/01/2022  5:48 PM      Passed - Cr in normal range and within 360 days    Creatinine  Date Value Ref Range Status  07/06/2013 0.91 0.60 - 1.30 mg/dL Final   Creatinine, Ser  Date Value Ref Range Status  10/16/2021 0.96 0.57 - 1.00 mg/dL Final         Passed - Valid encounter within last 12 months    Recent Outpatient Visits           8 months ago Annual physical exam   [facility] Glean Hess, MD   1 year ago Essential hypertension   [facility] Glean Hess, MD  1 year ago Essential hypertension   [facility] Glean Hess, MD   1 year ago Tobacco use disorder   [facility] Glean Hess, MD   1 year ago Lumbar disc herniation with radiculopathy   [facility] Glean Hess, MD

## 2022-07-15 ENCOUNTER — Ambulatory Visit: Payer: Medicare Other

## 2022-07-19 ENCOUNTER — Ambulatory Visit: Payer: Medicare Other

## 2022-07-19 NOTE — Progress Notes (Deleted)
I attempted to contact the patient for her Medicare Wellness Visit, no answer. Call forwarded to voicemail. I left a detail message on the patient voicemail.

## 2022-07-22 ENCOUNTER — Telehealth: Payer: Self-pay | Admitting: Internal Medicine

## 2022-07-22 NOTE — Telephone Encounter (Signed)
Copied from [place] [id number]. Topic: Medicare AWV >> Jul 22, 2022  2:02 PM Jae Dire wrote: Reason for CRM:  Left message for patient to call back and schedule Medicare Annual Wellness Visit (AWV) in office.   If unable to come into the office for AWV,  please offer to do virtually or by telephone.  Last AWV: 07/11/2021  Please schedule at anytime with [facility] Health Advisor.      30 minute appointment for Virtual or phone 45 minute appointment for in office or Initial virtual/phone  Any questions, please call me at [phone]

## 2022-07-31 ENCOUNTER — Telehealth: Payer: Self-pay | Admitting: Internal Medicine

## 2022-07-31 ENCOUNTER — Other Ambulatory Visit: Payer: Self-pay | Admitting: Internal Medicine

## 2022-07-31 DIAGNOSIS — E782 Mixed hyperlipidemia: Secondary | ICD-10-CM

## 2022-07-31 DIAGNOSIS — E876 Hypokalemia: Secondary | ICD-10-CM

## 2022-07-31 DIAGNOSIS — Z9109 Other allergy status, other than to drugs and biological substances: Secondary | ICD-10-CM

## 2022-07-31 DIAGNOSIS — J3089 Other allergic rhinitis: Secondary | ICD-10-CM

## 2022-07-31 DIAGNOSIS — F5104 Psychophysiologic insomnia: Secondary | ICD-10-CM

## 2022-07-31 DIAGNOSIS — K279 Peptic ulcer, site unspecified, unspecified as acute or chronic, without hemorrhage or perforation: Secondary | ICD-10-CM

## 2022-07-31 DIAGNOSIS — M5116 Intervertebral disc disorders with radiculopathy, lumbar region: Secondary | ICD-10-CM

## 2022-07-31 DIAGNOSIS — I10 Essential (primary) hypertension: Secondary | ICD-10-CM

## 2022-07-31 DIAGNOSIS — G629 Polyneuropathy, unspecified: Secondary | ICD-10-CM

## 2022-07-31 DIAGNOSIS — F17200 Nicotine dependence, unspecified, uncomplicated: Secondary | ICD-10-CM

## 2022-07-31 MED ORDER — METHOCARBAMOL 500 MG PO TABS: 500.0000 mg | ORAL_TABLET | Freq: Four times a day (QID) | ORAL | 0 refills | Status: AC

## 2022-07-31 MED ORDER — TRAZODONE HCL 50 MG PO TABS: ORAL_TABLET | ORAL | 0 refills | Status: AC

## 2022-07-31 MED ORDER — ALBUTEROL SULFATE HFA 108 (90 BASE) MCG/ACT IN AERS: 2.0000 | INHALATION_SPRAY | Freq: Four times a day (QID) | RESPIRATORY_TRACT | 0 refills | Status: AC | PRN

## 2022-07-31 MED ORDER — METOPROLOL TARTRATE 50 MG PO TABS: 50.0000 mg | ORAL_TABLET | Freq: Two times a day (BID) | ORAL | 0 refills | Status: AC

## 2022-07-31 MED ORDER — GABAPENTIN 300 MG PO CAPS: ORAL_CAPSULE | ORAL | 0 refills | Status: AC

## 2022-07-31 MED ORDER — DULOXETINE HCL 60 MG PO CPEP: 60.0000 mg | ORAL_CAPSULE | Freq: Every day | ORAL | 0 refills | Status: AC

## 2022-07-31 MED ORDER — PANTOPRAZOLE SODIUM 40 MG PO TBEC: DELAYED_RELEASE_TABLET | ORAL | 0 refills | Status: AC

## 2022-07-31 MED ORDER — AMLODIPINE BESYLATE 5 MG PO TABS: 5.0000 mg | ORAL_TABLET | Freq: Every day | ORAL | 0 refills | Status: AC

## 2022-07-31 MED ORDER — LORATADINE 10 MG PO TABS: ORAL_TABLET | ORAL | 0 refills | Status: DC

## 2022-07-31 MED ORDER — CHLORTHALIDONE 25 MG PO TABS: 25.0000 mg | ORAL_TABLET | Freq: Every day | ORAL | 0 refills | Status: AC

## 2022-07-31 MED ORDER — POTASSIUM CHLORIDE CRYS ER 20 MEQ PO TBCR: 20.0000 meq | EXTENDED_RELEASE_TABLET | Freq: Every day | ORAL | 0 refills | Status: AC

## 2022-07-31 MED ORDER — ATORVASTATIN CALCIUM 40 MG PO TABS: 40.0000 mg | ORAL_TABLET | Freq: Every day | ORAL | 0 refills | Status: AC

## 2022-07-31 NOTE — Telephone Encounter (Signed)
Pt returned call, verbalized understanding. Disconnected while waiting to get connected with front desk

## 2022-07-31 NOTE — Telephone Encounter (Signed)
Has moved to [place]  - and needs refills until she can establish with new provider.  Has tried to establish but her appointment was cancelled/ rescheduled due to the provider leaving the new practice she was trying to establish with.   They cannot get her back in until October.

## 2022-07-31 NOTE — Telephone Encounter (Signed)
Dr. [name] sent in 30 day refill.  Called pt left VM to call back.  KP

## 2022-07-31 NOTE — Telephone Encounter (Signed)
Copied from [place] [id number]. Topic: Medicare AWV >> Jul 31, 2022  1:08 PM Jae Dire wrote: Reason for CRM:  Left message for patient to call back and schedule Medicare Annual Wellness Visit (AWV) in office.   If unable to come into the office for AWV,  please offer to do virtually or by telephone.  Last AWV: 07/11/2021  Please schedule at anytime with [facility] Health Advisor.      30 minute appointment for Virtual or phone 45 minute appointment for in office or Initial virtual/phone  Any questions, please call me at [phone]

## 2022-09-19 ENCOUNTER — Telehealth: Payer: Self-pay | Admitting: Internal Medicine

## 2022-09-19 NOTE — Telephone Encounter (Signed)
Copied from [place] [id number]. Topic: Medicare AWV >> Sep 19, 2022  9:46 AM Jae Dire wrote: Reason for CRM:  Left message for patient to call back and schedule Medicare Annual Wellness Visit (AWV) in office.   If unable to come into the office for AWV,  please offer to do virtually or by telephone.  Last AWV:  07/11/2021  Please schedule at any time with [facility].      30 minute appointment for Virtual or phone 45 minute appointment for in office or Initial virtual/phone  Any questions, please call me at [phone]

## 2022-10-18 ENCOUNTER — Other Ambulatory Visit: Payer: Self-pay | Admitting: Internal Medicine

## 2022-10-18 DIAGNOSIS — R7309 Other abnormal glucose: Secondary | ICD-10-CM

## 2022-10-21 NOTE — Telephone Encounter (Signed)
Requested medication (s) are due for refill today: yes  Requested medication (s) are on the active medication list: yes  Last refill:  07/02/22  Future visit scheduled: no  Notes to clinic: Unable to refill per protocol, appointment needed.      Requested Prescriptions  Pending Prescriptions Disp Refills   Accu-Chek FastClix Lancets MISC [Pharmacy Med Name: [place] 017 each 0    Sig: USE LANCET TO CHECK GLUCOSE TWICE DAILY     Endocrinology: Diabetes - Testing Supplies Failed - 10/18/2022  7:13 AM      Failed - Valid encounter within last 12 months    Recent Outpatient Visits           1 year ago Annual physical exam   [place] [facility] at [facility], [name], MD   1 year ago Essential hypertension   [place] [facility] at [facility], [name], MD   1 year ago Essential hypertension   [place] [facility] at [facility], [name], MD   1 year ago Tobacco use disorder   [place] [facility] at [facility], [name], MD   1 year ago Lumbar disc herniation with radiculopathy   [facility] at [facility], [name], MD

## 2022-11-08 ENCOUNTER — Telehealth: Payer: Self-pay | Admitting: Internal Medicine

## 2022-11-08 NOTE — Telephone Encounter (Signed)
Copied from [place] [phone]. Topic: Medicare AWV >> Nov 08, 2022 11:43 AM Devoria Glassing wrote: Reason for CRM: Left message tor patient to schedule Medicare Annual Wellness Visit (AWV) with [facility] Health Advisor.  Appointment can be an offiice/telephone or virtual visit;  Please call [phone] ask for [facility].

## 2022-12-05 ENCOUNTER — Telehealth: Payer: Self-pay | Admitting: Internal Medicine

## 2022-12-05 NOTE — Telephone Encounter (Signed)
Copied from [place] [id number]. Topic: Medicare AWV >> Dec 05, 2022 10:53 AM Devoria Glassing wrote: Reason for CRM: Left message tor patient to schedule Medicare Annual Wellness Visit (AWV) with [facility] Health Advisor.  Appointment can be an offiice/telephone or virtual visit;  Please call [phone] ask for [facility].

## 2022-12-08 ENCOUNTER — Other Ambulatory Visit: Payer: Self-pay | Admitting: Internal Medicine

## 2022-12-08 DIAGNOSIS — R7309 Other abnormal glucose: Secondary | ICD-10-CM

## 2022-12-09 ENCOUNTER — Other Ambulatory Visit: Payer: Self-pay

## 2022-12-09 DIAGNOSIS — R7309 Other abnormal glucose: Secondary | ICD-10-CM

## 2022-12-24 ENCOUNTER — Other Ambulatory Visit: Payer: Self-pay | Admitting: Internal Medicine

## 2022-12-24 DIAGNOSIS — J3089 Other allergic rhinitis: Secondary | ICD-10-CM

## 2022-12-24 DIAGNOSIS — Z9109 Other allergy status, other than to drugs and biological substances: Secondary | ICD-10-CM

## 2023-03-19 ENCOUNTER — Other Ambulatory Visit: Payer: Self-pay | Admitting: Internal Medicine

## 2023-03-19 DIAGNOSIS — J3089 Other allergic rhinitis: Secondary | ICD-10-CM

## 2023-03-19 DIAGNOSIS — Z9109 Other allergy status, other than to drugs and biological substances: Secondary | ICD-10-CM

## 2023-03-19 NOTE — Telephone Encounter (Signed)
Requested Prescriptions  Pending Prescriptions Disp Refills   EQ ALL DAY ALLERGY RELIEF 10 MG tablet [Pharmacy Med Name: EQ All Day Allergy Relief 10 MG Oral Tablet] 90 tablet 0    Sig: Take 1 tablet by mouth once daily     Ear, Nose, and Throat:  Antihistamines 2 Failed - 03/19/2023 10:40 AM      Failed - Cr in normal range and within 360 days    Creatinine  Date Value Ref Range Status  07/06/2013 0.91 0.60 - 1.30 mg/dL Final   Creatinine, Ser  Date Value Ref Range Status  10/16/2021 0.96 0.57 - 1.00 mg/dL Final         Failed - Valid encounter within last 12 months    Recent Outpatient Visits           1 year ago Annual physical exam   [place] [facility], Nyoka Cowden, MD   1 year ago Essential hypertension   [place] [facility] at [facility], Nyoka Cowden, MD   1 year ago Essential hypertension   [place] [facility] at [facility], Nyoka Cowden, MD   2 years ago Tobacco use disorder   [facility] at [facility], Nyoka Cowden, MD   2 years ago Lumbar disc herniation with radiculopathy   [facility] at [facility], Nyoka Cowden, MD

## 2023-03-24 ENCOUNTER — Other Ambulatory Visit: Payer: Self-pay | Admitting: Internal Medicine

## 2023-03-24 DIAGNOSIS — J3089 Other allergic rhinitis: Secondary | ICD-10-CM

## 2023-03-24 DIAGNOSIS — Z9109 Other allergy status, other than to drugs and biological substances: Secondary | ICD-10-CM

## 2023-05-06 ENCOUNTER — Other Ambulatory Visit: Payer: Self-pay | Admitting: Internal Medicine

## 2023-05-06 DIAGNOSIS — R7309 Other abnormal glucose: Secondary | ICD-10-CM

## 2023-05-07 NOTE — Telephone Encounter (Signed)
Unable to refill per protocol, appointment needed.   Requested Prescriptions  Pending Prescriptions Disp Refills   Accu-Chek FastClix Lancets MISC [Pharmacy Med Name: FASTCLIX LANCETS    MIS] 408 each 0    Sig: USE 1  TO CHECK GLUCOSE TWICE DAILY     Endocrinology: Diabetes - Testing Supplies Failed - 05/06/2023  7:13 AM      Failed - Valid encounter within last 12 months    Recent Outpatient Visits           1 year ago Annual physical exam   [place] [facility] at [facility], Nyoka Cowden, MD   1 year ago Essential hypertension   [place] [facility] at [facility], Nyoka Cowden, MD   2 years ago Essential hypertension   [place] [facility] at [facility], Nyoka Cowden, MD   2 years ago Tobacco use disorder   [facility] at [facility], Nyoka Cowden, MD   2 years ago Lumbar disc herniation with radiculopathy   [facility] at [facility], Nyoka Cowden, MD

## 2023-06-20 ENCOUNTER — Other Ambulatory Visit: Payer: Self-pay | Admitting: Internal Medicine

## 2023-06-20 DIAGNOSIS — Z9109 Other allergy status, other than to drugs and biological substances: Secondary | ICD-10-CM

## 2023-06-20 DIAGNOSIS — J3089 Other allergic rhinitis: Secondary | ICD-10-CM

## 2023-09-14 ENCOUNTER — Other Ambulatory Visit: Payer: Self-pay | Admitting: Internal Medicine

## 2023-09-14 DIAGNOSIS — J3089 Other allergic rhinitis: Secondary | ICD-10-CM

## 2023-09-14 DIAGNOSIS — Z9109 Other allergy status, other than to drugs and biological substances: Secondary | ICD-10-CM
